# Patient Record
Sex: Female | Born: 1955 | Race: Black or African American | Hispanic: No | Marital: Married | Smoking: Never smoker
Health system: Southern US, Community
[De-identification: ages and names within clinical notes are randomized; demographics above are authoritative.]

## PROBLEM LIST (undated history)

## (undated) DIAGNOSIS — E119 Type 2 diabetes mellitus without complications: Secondary | ICD-10-CM

## (undated) DIAGNOSIS — K2970 Gastritis, unspecified, without bleeding: Secondary | ICD-10-CM

## (undated) DIAGNOSIS — N200 Calculus of kidney: Secondary | ICD-10-CM

## (undated) DIAGNOSIS — F419 Anxiety disorder, unspecified: Secondary | ICD-10-CM

## (undated) DIAGNOSIS — D126 Benign neoplasm of colon, unspecified: Secondary | ICD-10-CM

## (undated) DIAGNOSIS — N281 Cyst of kidney, acquired: Secondary | ICD-10-CM

## (undated) DIAGNOSIS — M9909 Segmental and somatic dysfunction of abdomen and other regions: Secondary | ICD-10-CM

## (undated) DIAGNOSIS — Z8679 Personal history of other diseases of the circulatory system: Secondary | ICD-10-CM

## (undated) DIAGNOSIS — I456 Pre-excitation syndrome: Secondary | ICD-10-CM

## (undated) DIAGNOSIS — K219 Gastro-esophageal reflux disease without esophagitis: Secondary | ICD-10-CM

## (undated) DIAGNOSIS — K5730 Diverticulosis of large intestine without perforation or abscess without bleeding: Secondary | ICD-10-CM

## (undated) DIAGNOSIS — E7800 Pure hypercholesterolemia, unspecified: Secondary | ICD-10-CM

## (undated) DIAGNOSIS — T7840XA Allergy, unspecified, initial encounter: Secondary | ICD-10-CM

## (undated) HISTORY — DX: Allergy, unspecified, initial encounter: T78.40XA

## (undated) HISTORY — DX: Diverticulosis of large intestine without perforation or abscess without bleeding: K57.30

## (undated) HISTORY — DX: Type 2 diabetes mellitus without complications: E11.9

## (undated) HISTORY — PX: TUBAL LIGATION: SHX77

## (undated) HISTORY — DX: Anxiety disorder, unspecified: F41.9

## (undated) HISTORY — DX: Calculus of kidney: N20.0

## (undated) HISTORY — DX: Pre-excitation syndrome: I45.6

## (undated) HISTORY — DX: Personal history of other diseases of the circulatory system: Z86.79

## (undated) HISTORY — DX: Cyst of kidney, acquired: N28.1

## (undated) HISTORY — DX: Pure hypercholesterolemia, unspecified: E78.00

## (undated) HISTORY — DX: Segmental and somatic dysfunction of abdomen and other regions: M99.09

## (undated) HISTORY — DX: Gastritis, unspecified, without bleeding: K29.70

## (undated) HISTORY — DX: Benign neoplasm of colon, unspecified: D12.6

## (undated) HISTORY — DX: Gastro-esophageal reflux disease without esophagitis: K21.9

---
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
## 1998-11-02 ENCOUNTER — Other Ambulatory Visit: Admission: RE | Admit: 1998-11-02 | Discharge: 1998-11-02 | Payer: Self-pay | Admitting: Obstetrics

## 1998-11-11 ENCOUNTER — Encounter (INDEPENDENT_AMBULATORY_CARE_PROVIDER_SITE_OTHER): Payer: Self-pay | Admitting: Specialist

## 1998-11-11 ENCOUNTER — Ambulatory Visit (HOSPITAL_COMMUNITY): Admission: RE | Admit: 1998-11-11 | Discharge: 1998-11-11 | Payer: Self-pay | Admitting: Obstetrics

## 1999-04-11 ENCOUNTER — Encounter: Admission: RE | Admit: 1999-04-11 | Discharge: 1999-07-10 | Payer: Self-pay | Admitting: Pulmonary Disease

## 2000-03-08 ENCOUNTER — Other Ambulatory Visit: Admission: RE | Admit: 2000-03-08 | Discharge: 2000-03-08 | Payer: Self-pay | Admitting: Obstetrics

## 2001-05-31 ENCOUNTER — Encounter: Payer: Self-pay | Admitting: Emergency Medicine

## 2001-05-31 ENCOUNTER — Emergency Department (HOSPITAL_COMMUNITY): Admission: EM | Admit: 2001-05-31 | Discharge: 2001-05-31 | Payer: Self-pay | Admitting: Emergency Medicine

## 2001-12-31 ENCOUNTER — Emergency Department (HOSPITAL_COMMUNITY): Admission: EM | Admit: 2001-12-31 | Discharge: 2001-12-31 | Payer: Self-pay | Admitting: Emergency Medicine

## 2001-12-31 ENCOUNTER — Encounter: Payer: Self-pay | Admitting: Emergency Medicine

## 2002-01-09 HISTORY — PX: CARDIAC ELECTROPHYSIOLOGY MAPPING AND ABLATION: SHX1292

## 2002-02-11 ENCOUNTER — Encounter: Payer: Self-pay | Admitting: Internal Medicine

## 2002-02-11 ENCOUNTER — Ambulatory Visit (HOSPITAL_COMMUNITY): Admission: RE | Admit: 2002-02-11 | Discharge: 2002-02-12 | Payer: Self-pay | Admitting: Internal Medicine

## 2002-02-11 ENCOUNTER — Encounter: Payer: Self-pay | Admitting: Cardiology

## 2004-01-06 ENCOUNTER — Ambulatory Visit: Payer: Self-pay | Admitting: Pulmonary Disease

## 2004-01-28 ENCOUNTER — Ambulatory Visit: Payer: Self-pay | Admitting: Pulmonary Disease

## 2004-02-23 ENCOUNTER — Ambulatory Visit: Payer: Self-pay | Admitting: Pulmonary Disease

## 2004-04-28 ENCOUNTER — Ambulatory Visit: Payer: Self-pay | Admitting: Pulmonary Disease

## 2004-06-07 ENCOUNTER — Ambulatory Visit: Payer: Self-pay | Admitting: Pulmonary Disease

## 2004-07-06 ENCOUNTER — Ambulatory Visit: Payer: Self-pay | Admitting: Pulmonary Disease

## 2004-07-14 ENCOUNTER — Ambulatory Visit: Payer: Self-pay | Admitting: Internal Medicine

## 2004-07-28 ENCOUNTER — Ambulatory Visit: Payer: Self-pay | Admitting: Pulmonary Disease

## 2004-08-04 ENCOUNTER — Ambulatory Visit: Payer: Self-pay

## 2004-12-05 ENCOUNTER — Ambulatory Visit: Payer: Self-pay | Admitting: Pulmonary Disease

## 2004-12-07 ENCOUNTER — Ambulatory Visit: Payer: Self-pay | Admitting: Pulmonary Disease

## 2005-01-09 HISTORY — PX: COLONOSCOPY: SHX174

## 2005-02-17 ENCOUNTER — Ambulatory Visit: Payer: Self-pay | Admitting: Pulmonary Disease

## 2005-02-22 ENCOUNTER — Ambulatory Visit: Payer: Self-pay | Admitting: Pulmonary Disease

## 2005-05-30 ENCOUNTER — Ambulatory Visit: Payer: Self-pay | Admitting: Pulmonary Disease

## 2005-08-30 ENCOUNTER — Ambulatory Visit: Payer: Self-pay | Admitting: Pulmonary Disease

## 2005-09-13 ENCOUNTER — Ambulatory Visit: Payer: Self-pay | Admitting: Pulmonary Disease

## 2005-09-19 ENCOUNTER — Ambulatory Visit: Payer: Self-pay | Admitting: Pulmonary Disease

## 2005-10-18 ENCOUNTER — Encounter: Admission: RE | Admit: 2005-10-18 | Discharge: 2006-01-16 | Payer: Self-pay | Admitting: Pulmonary Disease

## 2005-10-25 ENCOUNTER — Ambulatory Visit: Payer: Self-pay | Admitting: Gastroenterology

## 2005-11-09 DIAGNOSIS — D126 Benign neoplasm of colon, unspecified: Secondary | ICD-10-CM

## 2005-11-09 HISTORY — DX: Benign neoplasm of colon, unspecified: D12.6

## 2005-11-10 ENCOUNTER — Encounter (INDEPENDENT_AMBULATORY_CARE_PROVIDER_SITE_OTHER): Payer: Self-pay | Admitting: *Deleted

## 2005-11-10 ENCOUNTER — Ambulatory Visit: Payer: Self-pay | Admitting: Gastroenterology

## 2005-12-26 ENCOUNTER — Ambulatory Visit: Payer: Self-pay | Admitting: Pulmonary Disease

## 2006-03-07 ENCOUNTER — Encounter: Admission: RE | Admit: 2006-03-07 | Discharge: 2006-06-05 | Payer: Self-pay | Admitting: Pulmonary Disease

## 2006-04-25 ENCOUNTER — Ambulatory Visit: Payer: Self-pay | Admitting: Pulmonary Disease

## 2006-05-01 ENCOUNTER — Ambulatory Visit: Payer: Self-pay | Admitting: Pulmonary Disease

## 2006-05-01 LAB — CONVERTED CEMR LAB
ALT: 41 units/L — ABNORMAL HIGH (ref 0–40)
AST: 32 units/L (ref 0–37)
Albumin: 4 g/dL (ref 3.5–5.2)
Alkaline Phosphatase: 101 units/L (ref 39–117)
BUN: 11 mg/dL (ref 6–23)
Bacteria, UA: NEGATIVE
Basophils Absolute: 0.1 10*3/uL (ref 0.0–0.1)
Basophils Relative: 0.8 % (ref 0.0–1.0)
Bilirubin Urine: NEGATIVE
Bilirubin, Direct: 0.1 mg/dL (ref 0.0–0.3)
CO2: 34 meq/L — ABNORMAL HIGH (ref 19–32)
Calcium: 9.7 mg/dL (ref 8.4–10.5)
Chloride: 104 meq/L (ref 96–112)
Cholesterol: 184 mg/dL (ref 0–200)
Creatinine, Ser: 0.6 mg/dL (ref 0.4–1.2)
Crystals: NEGATIVE
Eosinophils Absolute: 0.3 10*3/uL (ref 0.0–0.6)
Eosinophils Relative: 2.9 % (ref 0.0–5.0)
GFR calc Af Amer: 136 mL/min
GFR calc non Af Amer: 112 mL/min
Glucose, Bld: 122 mg/dL — ABNORMAL HIGH (ref 70–99)
HCT: 40.5 % (ref 36.0–46.0)
HDL: 58.5 mg/dL (ref >39.0)
Hemoglobin, Urine: NEGATIVE
Hemoglobin: 13.6 g/dL (ref 12.0–15.0)
Hgb A1c MFr Bld: 8.2 % — ABNORMAL HIGH (ref 4.6–6.0)
Ketones, ur: NEGATIVE mg/dL
LDL Cholesterol: 111 mg/dL — ABNORMAL HIGH (ref 0–99)
Leukocytes, UA: NEGATIVE
Lymphocytes Relative: 21.2 % (ref 12.0–46.0)
MCHC: 33.5 g/dL (ref 30.0–36.0)
MCV: 89.3 fL (ref 78.0–100.0)
Monocytes Absolute: 0.5 10*3/uL (ref 0.2–0.7)
Monocytes Relative: 4.6 % (ref 3.0–11.0)
Mucus, UA: NEGATIVE
Neutro Abs: 7.5 10*3/uL (ref 1.4–7.7)
Neutrophils Relative %: 70.5 % (ref 43.0–77.0)
Nitrite: NEGATIVE
Platelets: 240 10*3/uL (ref 150–400)
Potassium: 4 meq/L (ref 3.5–5.1)
RBC / HPF: NONE SEEN
RBC: 4.53 M/uL (ref 3.87–5.11)
RDW: 12.3 % (ref 11.5–14.6)
Sodium: 143 meq/L (ref 135–145)
Specific Gravity, Urine: 1.02 (ref 1.000–1.03)
TSH: 1.36 microintl units/mL (ref 0.35–5.50)
Total Bilirubin: 0.9 mg/dL (ref 0.3–1.2)
Total CHOL/HDL Ratio: 3.1
Total Protein, Urine: NEGATIVE mg/dL
Total Protein: 7.3 g/dL (ref 6.0–8.3)
Triglycerides: 75 mg/dL (ref 0–149)
Urine Glucose: NEGATIVE mg/dL
Urobilinogen, UA: 0.2 (ref 0.0–1.0)
VLDL: 15 mg/dL (ref 0–40)
WBC: 10.7 10*3/uL — ABNORMAL HIGH (ref 4.5–10.5)
pH: 5.5 (ref 5.0–8.0)

## 2006-07-09 ENCOUNTER — Ambulatory Visit: Payer: Self-pay | Admitting: Pulmonary Disease

## 2006-09-20 ENCOUNTER — Ambulatory Visit: Payer: Self-pay | Admitting: Pulmonary Disease

## 2006-09-20 LAB — CONVERTED CEMR LAB
Cholesterol: 140 mg/dL (ref 0–200)
HDL: 37 mg/dL — ABNORMAL LOW (ref >39.0)
Hgb A1c MFr Bld: 7.4 % — ABNORMAL HIGH (ref 4.6–6.0)
LDL Cholesterol: 85 mg/dL (ref 0–99)
Total CHOL/HDL Ratio: 3.8
Triglycerides: 89 mg/dL (ref 0–149)
VLDL: 18 mg/dL (ref 0–40)

## 2006-09-27 ENCOUNTER — Ambulatory Visit: Payer: Self-pay | Admitting: Internal Medicine

## 2006-09-27 ENCOUNTER — Ambulatory Visit: Payer: Self-pay | Admitting: Pulmonary Disease

## 2006-12-15 DIAGNOSIS — F411 Generalized anxiety disorder: Secondary | ICD-10-CM | POA: Insufficient documentation

## 2006-12-15 DIAGNOSIS — K219 Gastro-esophageal reflux disease without esophagitis: Secondary | ICD-10-CM | POA: Insufficient documentation

## 2006-12-15 DIAGNOSIS — J309 Allergic rhinitis, unspecified: Secondary | ICD-10-CM | POA: Insufficient documentation

## 2006-12-15 DIAGNOSIS — E1169 Type 2 diabetes mellitus with other specified complication: Secondary | ICD-10-CM | POA: Insufficient documentation

## 2006-12-15 DIAGNOSIS — E785 Hyperlipidemia, unspecified: Secondary | ICD-10-CM

## 2006-12-28 ENCOUNTER — Ambulatory Visit: Payer: Self-pay | Admitting: Pulmonary Disease

## 2007-02-07 ENCOUNTER — Ambulatory Visit: Payer: Self-pay | Admitting: Pulmonary Disease

## 2007-03-07 ENCOUNTER — Ambulatory Visit: Payer: Self-pay | Admitting: Pulmonary Disease

## 2007-03-07 DIAGNOSIS — M999 Biomechanical lesion, unspecified: Secondary | ICD-10-CM | POA: Insufficient documentation

## 2007-03-07 DIAGNOSIS — I456 Pre-excitation syndrome: Secondary | ICD-10-CM | POA: Insufficient documentation

## 2007-03-07 DIAGNOSIS — J4520 Mild intermittent asthma, uncomplicated: Secondary | ICD-10-CM | POA: Insufficient documentation

## 2007-03-07 DIAGNOSIS — K5730 Diverticulosis of large intestine without perforation or abscess without bleeding: Secondary | ICD-10-CM | POA: Insufficient documentation

## 2007-03-07 DIAGNOSIS — N281 Cyst of kidney, acquired: Secondary | ICD-10-CM | POA: Insufficient documentation

## 2007-03-07 DIAGNOSIS — I498 Other specified cardiac arrhythmias: Secondary | ICD-10-CM | POA: Insufficient documentation

## 2007-03-07 DIAGNOSIS — J45909 Unspecified asthma, uncomplicated: Secondary | ICD-10-CM | POA: Insufficient documentation

## 2007-06-18 ENCOUNTER — Telehealth: Payer: Self-pay | Admitting: Pulmonary Disease

## 2007-06-19 ENCOUNTER — Ambulatory Visit: Payer: Self-pay | Admitting: Pulmonary Disease

## 2007-06-26 ENCOUNTER — Ambulatory Visit: Payer: Self-pay | Admitting: Pulmonary Disease

## 2007-06-26 LAB — CONVERTED CEMR LAB
ALT: 60 units/L — ABNORMAL HIGH (ref 0–35)
AST: 74 units/L — ABNORMAL HIGH (ref 0–37)
Albumin: 4.1 g/dL (ref 3.5–5.2)
Alkaline Phosphatase: 106 units/L (ref 39–117)
BUN: 10 mg/dL (ref 6–23)
Basophils Absolute: 0.1 10*3/uL (ref 0.0–0.1)
Basophils Relative: 1 % (ref 0.0–1.0)
Bilirubin Urine: NEGATIVE
Bilirubin, Direct: 0.8 mg/dL — ABNORMAL HIGH (ref 0.0–0.3)
CO2: 29 meq/L (ref 19–32)
Calcium: 9 mg/dL (ref 8.4–10.5)
Chloride: 103 meq/L (ref 96–112)
Cholesterol: 138 mg/dL (ref 0–200)
Creatinine, Ser: 0.7 mg/dL (ref 0.4–1.2)
Eosinophils Absolute: 0.3 10*3/uL (ref 0.0–0.7)
Eosinophils Relative: 4.1 % (ref 0.0–5.0)
GFR calc Af Amer: 113 mL/min
GFR calc non Af Amer: 93 mL/min
Glucose, Bld: 166 mg/dL — ABNORMAL HIGH (ref 70–99)
HCT: 40.2 % (ref 36.0–46.0)
HDL: 35.4 mg/dL — ABNORMAL LOW (ref >39.0)
Hemoglobin, Urine: NEGATIVE
Hemoglobin: 13.8 g/dL (ref 12.0–15.0)
Hgb A1c MFr Bld: 8.2 % — ABNORMAL HIGH (ref 4.6–6.0)
Ketones, ur: NEGATIVE mg/dL
LDL Cholesterol: 92 mg/dL (ref 0–99)
Leukocytes, UA: NEGATIVE
Lymphocytes Relative: 23 % (ref 12.0–46.0)
MCHC: 34.3 g/dL (ref 30.0–36.0)
MCV: 89 fL (ref 78.0–100.0)
Monocytes Absolute: 0.5 10*3/uL (ref 0.1–1.0)
Monocytes Relative: 6.1 % (ref 3.0–12.0)
Neutro Abs: 5 10*3/uL (ref 1.4–7.7)
Neutrophils Relative %: 65.8 % (ref 43.0–77.0)
Nitrite: NEGATIVE
Platelets: 195 10*3/uL (ref 150–400)
Potassium: 4.8 meq/L (ref 3.5–5.1)
RBC: 4.51 M/uL (ref 3.87–5.11)
RDW: 12.2 % (ref 11.5–14.6)
Sodium: 141 meq/L (ref 135–145)
Specific Gravity, Urine: >=1.03 (ref 1.000–1.03)
TSH: 0.95 microintl units/mL (ref 0.35–5.50)
Total Bilirubin: 1.4 mg/dL — ABNORMAL HIGH (ref 0.3–1.2)
Total CHOL/HDL Ratio: 3.9
Total Protein: 7.1 g/dL (ref 6.0–8.3)
Triglycerides: 55 mg/dL (ref 0–149)
Urine Glucose: NEGATIVE mg/dL
Urobilinogen, UA: 0.2 (ref 0.0–1.0)
VLDL: 11 mg/dL (ref 0–40)
WBC: 7.6 10*3/uL (ref 4.5–10.5)
pH: 5.5 (ref 5.0–8.0)

## 2007-08-14 ENCOUNTER — Telehealth: Payer: Self-pay | Admitting: Gastroenterology

## 2007-08-16 ENCOUNTER — Telehealth (INDEPENDENT_AMBULATORY_CARE_PROVIDER_SITE_OTHER): Payer: Self-pay | Admitting: *Deleted

## 2007-09-10 DIAGNOSIS — Z860100 Personal history of colon polyps, unspecified: Secondary | ICD-10-CM | POA: Insufficient documentation

## 2007-09-11 ENCOUNTER — Ambulatory Visit: Payer: Self-pay | Admitting: Gastroenterology

## 2007-09-13 ENCOUNTER — Ambulatory Visit (HOSPITAL_COMMUNITY): Admission: RE | Admit: 2007-09-13 | Discharge: 2007-09-13 | Payer: Self-pay | Admitting: Gastroenterology

## 2007-09-17 ENCOUNTER — Telehealth (INDEPENDENT_AMBULATORY_CARE_PROVIDER_SITE_OTHER): Payer: Self-pay

## 2007-09-18 ENCOUNTER — Ambulatory Visit: Payer: Self-pay | Admitting: Gastroenterology

## 2007-09-18 ENCOUNTER — Encounter: Payer: Self-pay | Admitting: Gastroenterology

## 2007-09-22 ENCOUNTER — Encounter: Payer: Self-pay | Admitting: Gastroenterology

## 2007-10-23 ENCOUNTER — Ambulatory Visit: Payer: Self-pay | Admitting: Gastroenterology

## 2007-10-23 DIAGNOSIS — K2970 Gastritis, unspecified, without bleeding: Secondary | ICD-10-CM | POA: Insufficient documentation

## 2007-10-23 DIAGNOSIS — K2990 Gastroduodenitis, unspecified, without bleeding: Secondary | ICD-10-CM

## 2007-10-23 DIAGNOSIS — N200 Calculus of kidney: Secondary | ICD-10-CM | POA: Insufficient documentation

## 2007-12-10 ENCOUNTER — Telehealth (INDEPENDENT_AMBULATORY_CARE_PROVIDER_SITE_OTHER): Payer: Self-pay | Admitting: *Deleted

## 2007-12-11 ENCOUNTER — Telehealth: Payer: Self-pay | Admitting: Pulmonary Disease

## 2007-12-11 ENCOUNTER — Encounter: Payer: Self-pay | Admitting: Pulmonary Disease

## 2008-05-08 ENCOUNTER — Telehealth: Payer: Self-pay | Admitting: Pulmonary Disease

## 2008-05-15 ENCOUNTER — Ambulatory Visit: Payer: Self-pay | Admitting: Adult Health

## 2008-05-16 ENCOUNTER — Encounter: Payer: Self-pay | Admitting: Adult Health

## 2008-05-18 LAB — CONVERTED CEMR LAB
Bilirubin Urine: NEGATIVE
Hemoglobin, Urine: NEGATIVE
Ketones, ur: NEGATIVE mg/dL
Leukocytes, UA: NEGATIVE
Nitrite: NEGATIVE
Specific Gravity, Urine: 1.025 (ref 1.000–1.030)
Total Protein, Urine: NEGATIVE mg/dL
Urine Glucose: >=1000 mg/dL
Urobilinogen, UA: 0.2 (ref 0.0–1.0)
pH: 5.5 (ref 5.0–8.0)

## 2008-06-09 ENCOUNTER — Telehealth: Payer: Self-pay | Admitting: Pulmonary Disease

## 2008-06-10 ENCOUNTER — Ambulatory Visit: Payer: Self-pay | Admitting: Pulmonary Disease

## 2008-06-17 ENCOUNTER — Ambulatory Visit: Payer: Self-pay | Admitting: Pulmonary Disease

## 2008-06-17 LAB — CONVERTED CEMR LAB
Albumin: 3.9 g/dL (ref 3.5–5.2)
Alkaline Phosphatase: 101 units/L (ref 39–117)
BUN: 12 mg/dL (ref 6–23)
Basophils Absolute: 0 10*3/uL (ref 0.0–0.1)
CO2: 28 meq/L (ref 19–32)
Calcium: 8.7 mg/dL (ref 8.4–10.5)
Creatinine, Ser: 0.7 mg/dL (ref 0.4–1.2)
Eosinophils Relative: 5.5 % — ABNORMAL HIGH (ref 0.0–5.0)
GFR calc non Af Amer: 112.23 mL/min (ref >60)
Glucose, Bld: 161 mg/dL — ABNORMAL HIGH (ref 70–99)
HCT: 38.5 % (ref 36.0–46.0)
HDL: 40.5 mg/dL (ref >39.00)
Hemoglobin: 13.3 g/dL (ref 12.0–15.0)
Hgb A1c MFr Bld: 8.1 % — ABNORMAL HIGH (ref 4.6–6.5)
Ketones, ur: NEGATIVE mg/dL
Leukocytes, UA: NEGATIVE
Lymphocytes Relative: 20.5 % (ref 12.0–46.0)
Lymphs Abs: 1.6 10*3/uL (ref 0.7–4.0)
Microalb Creat Ratio: 5 mg/g (ref 0.0–30.0)
Microalb, Ur: 0.5 mg/dL (ref 0.0–1.9)
Monocytes Relative: 5.6 % (ref 3.0–12.0)
Neutro Abs: 5.4 10*3/uL (ref 1.4–7.7)
RBC: 4.36 M/uL (ref 3.87–5.11)
Sodium: 145 meq/L (ref 135–145)
Specific Gravity, Urine: 1.02 (ref 1.000–1.030)
Total Protein: 7 g/dL (ref 6.0–8.3)
Triglycerides: 121 mg/dL (ref 0.0–149.0)
Urine Glucose: NEGATIVE mg/dL
Urobilinogen, UA: 1 (ref 0.0–1.0)
VLDL: 24.2 mg/dL (ref 0.0–40.0)
Vit D, 25-Hydroxy: 15 ng/mL — ABNORMAL LOW (ref 30–89)
WBC: 7.8 10*3/uL (ref 4.5–10.5)
pH: 6 (ref 5.0–8.0)

## 2008-07-01 ENCOUNTER — Telehealth (INDEPENDENT_AMBULATORY_CARE_PROVIDER_SITE_OTHER): Payer: Self-pay | Admitting: *Deleted

## 2008-07-01 ENCOUNTER — Telehealth: Payer: Self-pay | Admitting: Gastroenterology

## 2008-10-14 ENCOUNTER — Ambulatory Visit: Payer: Self-pay | Admitting: Pulmonary Disease

## 2009-01-07 LAB — PROCEDURE REPORT - SCANNED: Pap: ABNORMAL — AB

## 2009-05-18 ENCOUNTER — Telehealth: Payer: Self-pay | Admitting: Pulmonary Disease

## 2009-05-19 ENCOUNTER — Ambulatory Visit: Payer: Self-pay | Admitting: Pulmonary Disease

## 2009-05-24 ENCOUNTER — Ambulatory Visit: Payer: Self-pay | Admitting: Pulmonary Disease

## 2009-05-24 DIAGNOSIS — E559 Vitamin D deficiency, unspecified: Secondary | ICD-10-CM | POA: Insufficient documentation

## 2009-05-24 LAB — CONVERTED CEMR LAB
CO2: 29 meq/L (ref 19–32)
Chloride: 102 meq/L (ref 96–112)
Cholesterol: 220 mg/dL — ABNORMAL HIGH (ref 0–200)
Direct LDL: 164.7 mg/dL
Hgb A1c MFr Bld: 7.7 % — ABNORMAL HIGH (ref 4.6–6.5)
Potassium: 3.9 meq/L (ref 3.5–5.1)
Total CHOL/HDL Ratio: 5
VLDL: 28.6 mg/dL (ref 0.0–40.0)

## 2009-06-15 ENCOUNTER — Ambulatory Visit: Payer: Self-pay | Admitting: Family Medicine

## 2009-06-15 ENCOUNTER — Encounter: Payer: Self-pay | Admitting: Pulmonary Disease

## 2009-11-03 ENCOUNTER — Telehealth: Payer: Self-pay | Admitting: Pulmonary Disease

## 2009-11-05 ENCOUNTER — Telehealth: Payer: Self-pay | Admitting: Pulmonary Disease

## 2009-11-08 ENCOUNTER — Encounter: Payer: Self-pay | Admitting: Pulmonary Disease

## 2009-11-16 ENCOUNTER — Encounter: Payer: Self-pay | Admitting: Pulmonary Disease

## 2009-11-16 ENCOUNTER — Ambulatory Visit: Payer: Self-pay | Admitting: Adult Health

## 2009-11-16 DIAGNOSIS — N3000 Acute cystitis without hematuria: Secondary | ICD-10-CM | POA: Insufficient documentation

## 2009-11-17 ENCOUNTER — Encounter: Payer: Self-pay | Admitting: Adult Health

## 2009-11-25 ENCOUNTER — Encounter: Payer: Self-pay | Admitting: Adult Health

## 2009-12-29 ENCOUNTER — Telehealth (INDEPENDENT_AMBULATORY_CARE_PROVIDER_SITE_OTHER): Payer: Self-pay | Admitting: *Deleted

## 2010-02-08 NOTE — Medication Information (Signed)
Summary: Prior autho & approved for Dexilant/[employer]  Prior autho & approved for Dexilant/[employer]   Imported By: Sherian Rein 11/12/2009 11:08:32  _____________________________________________________________________  External Attachment:    Type:   Image     Comment:   External Document

## 2010-02-08 NOTE — Miscellaneous (Signed)
Summary: BONE DENSITY  Clinical Lists Changes  Orders: Added new Test order of T-Bone Densitometry (77080) - Signed Added new Test order of T-Lumbar Vertebral Assessment (77082) - Signed 

## 2010-02-08 NOTE — Progress Notes (Signed)
Summary: rx sub?  Phone Note Call from Patient   Caller: Patient Call For: Lynda Capistran Summary of Call: pt says that her ins will cover nexium if this is something dr Kriste Basque would want her to have in the meantime while she waits for prior auth on dexilant. pt needs something now. call her cell [phone] ([facility] [address]) Initial call taken by: Tivis Ringer, CNA,  November 05, 2009 3:54 PM  Follow-up for Phone Call        called and spoke with pt and i explained to her that i would leave 3 samples of the nexium up front so she does not have to pay for 2 different rx---pt voiced her understanding and is aware that we will let her know about the prior auth for the dexilant once we hear from them Randell Loop CMA  November 05, 2009 4:06 PM

## 2010-02-08 NOTE — Assessment & Plan Note (Signed)
Summary: UTI- PT GOING TO [facility] ///[facility]   Visit Type:  NP sick visit Primary Provider/Referring Provider:  Lorin Picket Nadel,M.D  CC:  C/o urinary urgency, frequency, odor, and pressure x  week c/o trace of blood starting yesterday.  History of Present Illness: 55  y/o BF with known hx of asthma  and DM.     ~  Jun10:  yearly follow up and still not taking her DM & Chol meds regularly- she admits to missing the evening doses 2-3-4 times weekly!!!  she uses [facility] on [address] and they have been calling her to let her know she is not regular w/ her meds... We discussed an adjusted med regimen- see A/P...  ~  Oct10:  she has no complaints- but informs me that she stopped the Januvia due to $$$... notes BS at home are 90-120 range & feeling well... she does note some allergy symptoms- HA, facial pain, stuffy nose, etc... ** we decided to Rx w/ ZPak + other meds.   ~  May 24, 2009:  here w/ husb for routine f/u> she admits to stopping her Lipitor 5 months ago due to a bad reaction her husb had w/ this med even though she tolerated it fine... FLP reveals TChol 220, LDL 165 & we discussed restarting this medication regularly ("I have alot of it at home")... she does say that she is taking the 3 DM meds regularly ([facility] [address] does not corroborate regular refills> ?getting mail order Rx) & labs show BS 95, A1c 7.7.Marland KitchenMarland Kitchen  we reviewed diet/ exercise/ & regular dosing recommendations... she notes some incr allergy symptoms and dizziness...  November 16, 2009 --Presents for an acute office visit. Complains of urinary urgency, frequency, odor, pressure x  1 week. Did notice a  trace of blood starting yesterday. No back pain, fever or vomitting. Last UTI was >3 yrs ago. > . Denies chest pain, dyspnea, orthopnea, hemoptysis, fever, n/v/d, edema, abx use. Doing well with DM meds. No hypoglycemic episodes.   Preventive Screening-Counseling & Management  Alcohol-Tobacco     Smoking Status: never  Current  Medications (verified): 1)  Zyrtec Allergy 10 Mg  Tabs (Cetirizine Hcl) .... As Needed 2)  Nasonex 50 Mcg/act  Susp (Mometasone Furoate) .... 2 Puffs in Each Nostril At Bedtime As Needed 3)  Advair Diskus 100-50 Mcg/dose  Misc (Fluticasone-Salmeterol) .... Take 1 Puff Two Times A Day Rinse Mouth After Each Use As Needed 4)  Proair Hfa 108 (90 Base) Mcg/act  Aers (Albuterol Sulfate) .Marland Kitchen.. 1-2 Inhalations Q4-6h As Needed For Wheezing... 5)  Lipitor 20 Mg  Tabs (Atorvastatin Calcium) .... Take One Tablet By Mouth Once Daily.Marland KitchenMarland Kitchen 6)  Metformin Hcl 500 Mg Tabs (Metformin Hcl) .... Take 2 Tabs By Mouth Two Times A Day W/ Breakfast & Dinner 7)  Glimepiride 4 Mg Tabs (Glimepiride) .... Take 1 Tab By Mouth Once Daily At Breakfast... 8)  Januvia 100 Mg  Tabs (Sitagliptin Phosphate) .... Take 1 Tablet By Mouth Once A Day At [employer]... 9)  Dexilant 60 Mg Cpdr (Dexlansoprazole) .... Take One By Mouth Once Daily... 10)  Womens Multivitamin Plus  Tabs (Multiple Vitamins-Minerals) .... Take 1 Tab Daily... 11)  Vitamin D 1000 Unit Caps (Cholecalciferol) .... Take 2 Caps By Mouth Once Daily... 12)  Accu-Chek Aviva  Strp (Glucose Blood) .... Check Blood Sugar Daily  Allergies (verified): 1)  ! 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) HYPERCHOLESTEROLEMIA (ICD-272.0) DIABETES MELLITUS, TYPE II (ICD-250.00) GERD (ICD-530.81) GASTRITIS (ICD-535.50) DIVERTICULOSIS OF COLON (ICD-562.10) COLONIC POLYPS, ADENOMATOUS, HX OF (ICD-V12.72) NEPHROLITHIASIS (ICD-592.0) RENAL CYST (ICD-593.2) VITAMIN D DEFICIENCY (ICD-268.9) ANXIETY (ICD-300.00) SOMATIC DYSFUNCTION (ICD-739.9)  Past Surgical History: Last updated: Sep 22, 2007 Unremarkable  Family History: Last updated: 22-Sep-2007 mother died age and cause unsure father alive  age 16 Family History of Breast Cancer: mother Family History of Diabetes: Father No FH of Colon Cancer:  Social History: Last updated: September 22, 2007 works for [employer] never smoked no etoh married 4 children Patient gets regular exercise.  Review of Systems      See HPI  Vital Signs:  Patient profile:   55 year old female Height:      62 inches Weight:      161 pounds BMI:     29.55 O2 Sat:      98 % on Room air Temp:     98.1 degrees F oral Pulse rate:   102 / minute BP sitting:   118 / 80  (left arm) Cuff size:   large  Vitals Entered By: Zackery Barefoot CMA (November 16, 2009 10:03 AM)  O2 Flow:  Room air CC: C/o urinary urgency, frequency, odor, pressure x  week c/o trace of blood starting yesterday Comments Medications reviewed with patient Verified contact number and pharmacy with patient Zackery Barefoot CMA  November 16, 2009 10:04 AM    Physical Exam  Additional Exam:  WD, WN, 55 y/o BF in NAD...   GENERAL:  Alert & oriented; pleasant & cooperative... HEENT:  [place]/AT, EOM-wnl,  EACs-clear, TMs-wnl, NOSE-clear, THROAT-clear & wnl. NECK:  Supple w/ full ROM; no JVD; normal carotid impulses w/o bruits; no thyromegaly or nodules palpated; no lymphadenopathy. CHEST:  Clear to P & A; without wheezes/ rales/ or rhonchi heard... HEART:  Regular Rhythm; without murmurs/ rubs/ or gallops. ABDOMEN:  Soft & nontender; normal bowel sounds; no organomegaly or masses detected, neg CVA tenderness.  EXT: without deformities or arthritic changes; no varicose veins/ venous insuffic/ or edema. NEURO:  CN's intact; no focal neuro deficits... DERM:  No lesions noted; no rash etc...     Impression & Recommendations:  Problem # 1:  ACUTE CYSTITIS (ICD-595.0)  UDIP w/ tr bld, and small leuk C/W UTI Cx pending.  Plan  Begin Cirpo 250mg  two times a day for urinary tract infection.  Pyridium as needed  I will call with labs.  Please contact office for sooner follow  up if symptoms do not improve or worsen  Her updated medication list for this problem includes:    Cipro 250 Mg Tabs (Ciprofloxacin hcl) .Marland Kitchen... 1 by mouth two times a day    Pyridium 100 Mg Tabs (Phenazopyridine hcl) .Marland Kitchen... 1 by mouth three times a day as needed bladder pain  Orders: T-Urine Culture (Spectrum Order) [id number]) Est. Patient Level IV (96295)  Problem # 2:  DIABETES MELLITUS, TYPE II (ICD-250.00) diet and exercise discussed  labs pending.  Her updated medication list for this problem includes:    Metformin Hcl 500 Mg Tabs (Metformin hcl) .Marland Kitchen... Take 2 tabs by mouth two times a day w/ breakfast & dinner    Glimepiride 4 Mg Tabs (Glimepiride) .Marland Kitchen... Take 1 tab by mouth once daily at breakfast...    Januvia 100 Mg Tabs (Sitagliptin phosphate) .Marland Kitchen... Take 1 tablet by mouth once a day at dinner...  Orders: TLB-BMP (Basic Metabolic Panel-BMET) (80048-METABOL) TLB-A1C / Hgb A1C (Glycohemoglobin) (83036-A1C)  Labs Reviewed: Creat: 0.6 (05/19/2009)    Reviewed HgBA1c results: 7.7 (05/19/2009)  8.1 (06/10/2008)  Medications Added to Medication List This Visit: 1)  Nasonex 50 Mcg/act Susp (Mometasone furoate) .... 2 puffs in each nostril at bedtime as needed 2)  Advair Diskus 100-50 Mcg/dose Misc (Fluticasone-salmeterol) .... Take 1 puff two times a day rinse mouth after each use as needed 3)  Cipro 250 Mg Tabs (Ciprofloxacin hcl) .Marland Kitchen.. 1 by mouth two times a day 4)  Pyridium 100 Mg Tabs (Phenazopyridine hcl) .Marland Kitchen.. 1 by mouth three times a day as needed bladder pain  Complete Medication List: 1)  Lipitor 20 Mg Tabs (Atorvastatin calcium) .... Take one tablet by mouth once daily.Marland KitchenMarland Kitchen 2)  Metformin Hcl 500 Mg Tabs (Metformin hcl) .... Take 2 tabs by mouth two times a day w/ breakfast & dinner 3)  Glimepiride 4 Mg Tabs (Glimepiride) .... Take 1 tab by mouth once daily at breakfast... 4)  Januvia 100 Mg Tabs (Sitagliptin phosphate) .... Take 1 tablet by mouth once a day at dinner... 5)   Dexilant 60 Mg Cpdr (Dexlansoprazole) .... Take one by mouth once daily.Marland KitchenMarland Kitchen 6)  Womens Multivitamin Plus Tabs (Multiple vitamins-minerals) .... Take 1 tab daily.Marland KitchenMarland Kitchen 7)  Vitamin D 1000 Unit Caps (Cholecalciferol) .... Take 2 caps by mouth once daily.Marland KitchenMarland Kitchen 8)  Accu-chek Aviva Strp (Glucose blood) .... Check blood sugar daily 9)  Zyrtec Allergy 10 Mg Tabs (Cetirizine hcl) .... As needed 10)  Nasonex 50 Mcg/act Susp (Mometasone furoate) .... 2 puffs in each nostril at bedtime as needed 11)  Advair Diskus 100-50 Mcg/dose Misc (Fluticasone-salmeterol) .... Take 1 puff two times a day rinse mouth after each use as needed 12)  Proair Hfa 108 (90 Base) Mcg/act Aers (Albuterol sulfate) .Marland Kitchen.. 1-2 inhalations q4-6h as needed for wheezing... 13)  Cipro 250 Mg Tabs (Ciprofloxacin hcl) .Marland Kitchen.. 1 by mouth two times a day 14)  Pyridium 100 Mg Tabs (Phenazopyridine hcl) .Marland Kitchen.. 1 by mouth three times a day as needed bladder pain  Patient Instructions: 1)  follow up Dr. Kriste Basque in 4 weeks for routine follow up and labs. -come fasting. February 13th, 10:30 am 2)  Begin Cirpo 250mg  two times a day for urinary tract infection.  3)  Diabetic diet w/ low sweets.  4)  I will call with labs.  5)  Please contact office for sooner follow up if symptoms do not improve or worsen  Prescriptions: PYRIDIUM 100 MG TABS (PHENAZOPYRIDINE HCL) 1 by mouth three times a day as needed bladder pain  #6 x 0   Entered and Authorized by:   Rubye Oaks NP   Signed by:   Tammy Parrett NP on 11/16/2009   Method used:   Electronically to        [id number] (retail)       [address] [place], [place]  [address]       Ph: [phone]       Fax: [phone]   RxID:   [id number] CIPRO 250 MG TABS (CIPROFLOXACIN HCL) 1 by mouth two times a day  #14 x 0   Entered and Authorized by:   Rubye Oaks NP   Signed by:   Tammy Parrett NP on 11/16/2009   Method used:   Electronically to        [id number]* (retail)       [address]       [place],  [place]  [address]       Ph: [phone]       Fax: [phone]   RxID:   [id number]    Not Administered:    Influenza Vaccine not given due to: declined

## 2010-02-08 NOTE — Progress Notes (Signed)
Summary: prior auth for dexilant 60mg  - approved 10.10.11 thru 10.30.13  Phone Note Outgoing Call   Summary of Call: called to initiate prior auth for pt to get the dexilant 60mg  daily---waiting on papers to be faxed over--case # [id number] Randell Loop CMA  November 03, 2009 5:07 PM   Follow-up for Phone Call        PA form received and placed  on SN cart for review and signature.Michel Bickers [facility]  November 04, 2009 11:14 AM  received PA approval for dexilant from 10.10.11 through 10.30.13.  approval letter and PA form sent to be scanned into EMR.  LMOM TCB to inform pt of this.  called [facility], spoke with pharm tech and advised of dexilant approval.   Boone Master CNA/MA  November 09, 2009 11:05 AM  Follow-up by: Michele Mcalpine MD,  November 12, 2009 6:37 PM

## 2010-02-08 NOTE — Progress Notes (Signed)
Summary: labs  Phone Note Call from Patient Call back at Home Phone [phone]   Caller: Patient Call For: Lenore Moyano Summary of Call: pt is due to have her 6 months check up. says she usually has labs done for diabetes, cholesterol, etc at this time. should she see tp for this or just have labs set up and then schedule a cpx w/ Tobey Schmelzle for next avail in aug?  Initial call taken by: Tivis Ringer, CNA,  May 18, 2009 9:39 AM  Follow-up for Phone Call        Pt was last seen by SN in Oct 2010 and was told to f/u in 6 months (no appts made in [facility]) .  Pt states she usually gets labs done as well.  Sn's first avail isn't until August.  Please advise if she needs to see TP, SN, or just have bloodwork done.  Thanks.  Aundra Millet Reynolds LPN  May 18, 2009 9:55 AM   Additional Follow-up for Phone Call Additional follow up Details #1::        per SN---add her on 5-16 at 3pm and she can come in for labs prior to ov  for    lip-272.0/bmp-401.9 and a1c-250.00.  thanks  labs are in computer for pt and i lmomtcb to make the appt for her. Randell Loop CMA  May 18, 2009 3:11 PM     Additional Follow-up for Phone Call Additional follow up Details #2::    pt called back and she is  able to come to the appt on 5-16 at 3pm.  this appt has been added to the schedule and she is aware of labs in the computer for in the am. Randell Loop CMA  May 18, 2009 4:00 PM

## 2010-02-08 NOTE — Miscellaneous (Signed)
Summary: 11.8.11 lab results > pt aware   Clinical Lists Changes     received lab results from [facility] from 11.8.11 ov w/ TP in [place].  per TP: kidney function looks good.  DM not quite as good.  rec better diet, recheck in 3 months.  if no better then, will need lantus. Boone Master CNA/MA  November 25, 2009 5:26 PM   called spoke with patient, advised of lab results as stated by TP in above append.  pt verbalized her understanding.  pt states that she has not done well w/ taking her metformin w/ dinner and will stive to improve at this in addition to better diet.  pt has upcoming appt w/ SN 2.2012, will keep that appt.  labs sent down to be scanned into EMR. Boone Master CNA/MA  November 26, 2009 12:24 PM

## 2010-02-08 NOTE — Letter (Signed)
Summary: Alroy Dust MD  Alroy Dust MD   Imported By: Sherian Rein 11/30/2009 08:14:14  _____________________________________________________________________  External Attachment:    Type:   Image     Comment:   External Document

## 2010-02-08 NOTE — Assessment & Plan Note (Signed)
Summary: 6 month follow up/labs prior to ov/la   Primary Care Provider:  Lorin Picket Letonia Stead,M.D  CC:  7 month ROV & review of mult medical problems....  History of Present Illness: 55 y/o BF here for a follow up visit... she has multiple medical problems as noted below...    ~  6/09:  she has a history of asthma and recurrent bronchitic infections, plus mult med issues as noted below... feeling OK in general, happy that her weight is down 5# on diet... unfortunately, she is still not taking her DM meds properly- still on just one Glucovance pill per day and her labs are worse than 9/08- see below... we discussed taking the Glucovance Bid w/ breakfast and dinner and I showed her where this is printed out for her on her disch sheets after each OV...   ~  Jun10:  yearly follow up and still not taking her DM & Chol meds regularly- she admits to missing the evening doses 2-3-4 times weekly!!!  she uses [facility] on [address] and they have been calling her to let her know she is not regular w/ her meds... We discussed an adjusted med regimen- see A/P...  ~  Oct10:  she has no complaints- but informs me that she stopped the Januvia due to $$$... notes BS at home are 90-120 range & feeling well... she does note some allergy symptoms- HA, facial pain, stuffy nose, etc... ** we decided to Rx w/ ZPak + other meds.   ~  May 24, 2009:  here w/ husb for routine f/u> she admits to stopping her Lipitor 5 months ago due to a bad reaction her husb had w/ this med even though she tolerated it fine... FLP reveals TChol 220, LDL 165 & we discussed restarting this medication regularly ("I have alot of it at home")... she does say that she is taking the 3 DM meds regularly ([facility] [address] does not corroborate regular refills> ?getting mail order Rx) & labs show BS 95, A1c 7.7.Marland KitchenMarland Kitchen  we reviewed diet/ exercise/ & regular dosing recommendations... she notes some incr allergy symptoms and dizziness...    Current Problem  List:  ALLERGIC RHINITIS (ICD-477.9) - controlled on Zyrtek, Nasonex, & Saline...  Note: ENT eval in past was neg.  ASTHMA (ICD-493.90) - controlled on ADVAIR 100 Bid (but only using it Prn now), & PROAIR Prn... uses Mucinex Prn as well.  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 has intermittent mild palpit, none recently...   ~  Myoview 7/06 was normal w/o ischemia, norm wall motion, EF=93%...   ~  2DEcho 7/06 w/ norm LVF...  ~  EKG 6/10 shows NSR, rate 90, NAD...  HYPERCHOLESTEROLEMIA (ICD-272.0) - prev on LIPITOR 20mg /d- she says she stopped it  ~1/11 due to reaction that her husb was having, even though she tolerated it well...  ~  FLP 9/08 on Lip20 showed TChol 140, TG 89, HDL 37, LDL 85  ~  FLP 6/09 on Lip20 showed TChol 138, TG 55, HDL 35, LDL 92... rec incr exercise, same med.  ~  FLP 6/10 ?on Lip20 showed TChol 181, TG 121, HDL 41, LDL 116... rec> take meds regularly!  ~  FLP 5/11 off med showed TChol 220, TG 143, HDL 47, LDL 165... rec> restart Lip20 every day!  DIABETES MELLITUS, TYPE II (ICD-250.00) - on METFORMIN 500mg - 2Bid, GLIMEPIRIDE 4mg /d, JANUVIA 100mg /d...  ~  labs 9/08 on Glucov showed BS= 122, HgA1c= 7.4... she was told to incr Glucovance to Bid after last OV but didn't do it!  ~  labs 6/09 showed BS= 166, HgA1c= 8.2.Marland KitchenMarland Kitchen rec to incr the Glucovance to Bid!!!  ~  12/09: had eye check from DrDolan- no retinopathy...  ~  labs 6/10 showed BS= 161, A1c= 8.1, Microalb= neg... decided to change Rx to Metformin 1000Bid, Glimepiride 4mg /d, Januvia 100mg /d.  ~  10/10: pt reports that she's not taking the Januvia due to $$, but can fill it now since deduct is met!  ~  labs 5/11 ?on 3 meds? showed BS= 95, A1c= 7.7.Marland KitchenMarland Kitchen rec to get on diet, take meds regularly.  GERD (ICD-530.81) - on DEXILANT 60mg /d... Omep was without benefit...  ~  GI eval 9/09 by DrStark- EGD showed  gastrityis, GERD;  AbdSonar showed fatty liver, cyst in left hep lobe, bilat renal cysts & stones.   DIVERTICULOSIS OF COLON (ICD-562.10) COLONIC POLYPS (ICD-211.3) - last colonoscopy was 11/07 showing divertics and 4mm polyp (adenomatous)... f/u planned 32yrs.  RENAL CYST (ICD-593.2) - followed by DGUYQIHK... AbdSonar 9/09 showed bilat renal cysts and stones in kidneys.  VITAMIN D DEFICIENCY (ICD-268.9) - Vit D level 6/10 = 15 and she was started on OTC Vit D 2000u daily.  ANXIETY (ICD-300.00) - she uses Alpraz Prn only...  SOMATIC DYSFUNCTION (ICD-739.9) - hx mult somatic complaints...  Health Maintenance -   ~  GYN: DrMarshall's office- PAP 11/09 was normal according to the pt...   ~  Mammograms at [facility] were OK according to the pt...  ~  BMD here 9/08 showed TScores -0.3 in Spine, and -0.4 in left FemNeck... NOTE: Vit D level= 15, therefore rec OTC Vit D 1000u Bid.  ~  Immunizations:  she had Flu shot 2010 at work... ?if they gave her a Pneumonia shot... ?last Tetanus vaccine.   Allergies: 1)  ! 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 mother died age and cause unsure father alive age 74 Family History of Breast Cancer: mother Family History of Diabetes: Father No FH of Colon Cancer:  Social History: Reviewed history from 09/11/2007 and no changes required. works for [employer] never smoked no  etoh married 4 children Patient gets regular exercise.  Review of Systems      See HPI  The patient denies anorexia, fever, weight loss, weight gain, vision loss, decreased hearing, hoarseness, chest pain, syncope, dyspnea on exertion, peripheral edema, prolonged cough, headaches, hemoptysis, abdominal pain, melena, hematochezia, severe indigestion/heartburn, hematuria, incontinence, muscle weakness, suspicious skin lesions, transient blindness, difficulty walking, depression, unusual weight change, abnormal bleeding, enlarged lymph nodes, and angioedema.    Vital Signs:  Patient profile:   55 year old female Height:      62 inches Weight:      159 pounds BMI:     29.19 O2 Sat:      95 % on Room air Temp:     99.1 degrees F oral Pulse rate:   96 / minute BP sitting:   124 / 80  (left arm) Cuff size:   regular  Vitals Entered By:  Randell Loop CMA (May 24, 2009 2:48 PM)  O2 Sat at Rest %:  95 O2 Flow:  Room air CC: 7 month ROV & review of mult medical problems... Is Patient Diabetic? Yes Pain Assessment Patient in pain? yes      Onset of pain  facial pain and bil ear pain Comments  meds updated today   Physical Exam  Additional Exam:  WD, WN, 55 y/o BF in NAD...   GENERAL:  Alert & oriented; pleasant & cooperative... HEENT:  [place]/AT, EOM-wnl,  EACs-clear, TMs-wnl, NOSE-clear, THROAT-clear & wnl. NECK:  Supple w/ full ROM; no JVD; normal carotid impulses w/o bruits; no thyromegaly or nodules palpated; no lymphadenopathy. CHEST:  Clear to P & A; without wheezes/ rales/ or rhonchi heard... HEART:  Regular Rhythm; without murmurs/ rubs/ or gallops. ABDOMEN:  Soft & nontender; normal bowel sounds; no organomegaly or masses detected, neg CVA tenderness.  EXT: without deformities, mild arthritic changes; no varicose veins/ venous insuffic/ or edema. NEURO:  CN's intact; no focal neuro deficits... DERM:  No lesions noted; no rash etc...    MISC. Report  Procedure date:   05/19/2009  Findings:      Lipid Panel (LIPID)   Cholesterol          [H]  220 mg/dL                   5-409   Triglycerides             143.0 mg/dL                 8.1-191.4   HDL                       78.29 mg/dL                 >56.21  Cholesterol LDL - Direct                             164.7 mg/dL  BMP (METABOL)   Sodium                    141 mEq/L                   135-145   Potassium                 3.9 mEq/L                   3.5-5.1   Chloride                  102 mEq/L                   96-112   Carbon Dioxide            29 mEq/L                    19-32   Glucose                   95 mg/dL                    30-86   BUN                       10 mg/dL  6-23   Creatinine                0.6 mg/dL                   1.6-1.0   Calcium                   9.2 mg/dL                   9.6-04.5   GFR                       138.94 mL/min               >60  Hemoglobin A1C (A1C)   Hemoglobin A1C       [H]  7.7 %                       4.6-6.5   Impression & Recommendations:  Problem # 1:  ASTHMA (ICD-493.90) Stable on Prn Advair & Proventil... chest clear, no recent exac... Her updated medication list for this problem includes:    Advair Diskus 100-50 Mcg/dose Misc (Fluticasone-salmeterol) .Marland Kitchen... Take 1 puff two times a day rinse mouth after each use    Proair Hfa 108 (90 Base) Mcg/act Aers (Albuterol sulfate) .Marland Kitchen... 1-2 inhalations q4-6h as needed for wheezing...  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 advised no caffeine etc...  Problem # 3:  HYPERCHOLESTEROLEMIA (ICD-272.0) FLP off meds shows TChol 220/ LDL 165... we discussed restarting the LIPITOR 20mg  Qd & take regularly... Her updated medication list for this problem includes:    Lipitor 20 Mg Tabs (Atorvastatin calcium) .Marland Kitchen... Take one tablet by mouth once daily...  Problem # 4:  DIABETES MELLITUS, TYPE II (ICD-250.00) She states taking 3  meds regularly, but control is sub-optimal... discussed diet, exercise, get wt down 15-20 lbs to avoid incr in meds & poss insulin Rx in future...  Her updated medication list for this problem includes:    Metformin Hcl 500 Mg Tabs (Metformin hcl) .Marland Kitchen... Take 2 tabs by mouth two times a day w/ breakfast & dinner    Glimepiride 4 Mg Tabs (Glimepiride) .Marland Kitchen... Take 1 tab by mouth once daily at breakfast...    Januvia 100 Mg Tabs (Sitagliptin phosphate) .Marland Kitchen... Take 1 tablet by mouth once a day at dinner...  Problem # 5:  GERD (ICD-530.81) Stable>  she requests refill Dexilant... Her updated medication list for this problem includes:    Dexilant 60 Mg Cpdr (Dexlansoprazole) .Marland Kitchen... Take one by mouth once daily...  Problem # 6:  COLONIC POLYPS, ADENOMATOUS, HX OF (ICD-V12.72) GI is stable and up to date...  Problem # 7:  VITAMIN D DEFICIENCY (ICD-268.9) She remains on Vit D 2000 u daily OTC... had BMD 9/08 that was WNL.Marland KitchenMarland Kitchen  Problem # 8:  OTHER MEDIICAL PROBLEMS AS NOTED>>>  Complete Medication List: 1)  Zyrtec Allergy 10 Mg Tabs (Cetirizine hcl) .... As needed 2)  Nasonex 50 Mcg/act Susp (Mometasone furoate) .... 2 puffs in each nostril at bedtime.Marland KitchenMarland Kitchen 3)  Advair Diskus 100-50 Mcg/dose Misc (Fluticasone-salmeterol) .... Take 1 puff two times a day rinse mouth after each use 4)  Proair Hfa 108 (90 Base) Mcg/act Aers (Albuterol sulfate) .Marland Kitchen.. 1-2 inhalations q4-6h as needed for wheezing... 5)  Lipitor 20 Mg Tabs (Atorvastatin calcium) .... Take one tablet by mouth once daily.Marland KitchenMarland Kitchen 6)  Metformin Hcl 500  Mg Tabs (Metformin hcl) .... Take 2 tabs by mouth two times a day w/ breakfast & dinner 7)  Glimepiride 4 Mg Tabs (Glimepiride) .... Take 1 tab by mouth once daily at breakfast... 8)  Januvia 100 Mg Tabs (Sitagliptin phosphate) .... Take 1 tablet by mouth once a day at dinner... 9)  Dexilant 60 Mg Cpdr (Dexlansoprazole) .... Take one by mouth once daily... 10)  Womens Multivitamin Plus Tabs (Multiple  vitamins-minerals) .... Take 1 tab daily... 11)  Vitamin D 1000 Unit Caps (Cholecalciferol) .... Take 2 caps by mouth once daily... 12)  Accu-chek Aviva Strp (Glucose blood) .... Check blood sugar daily  Other Orders: Prescription Created Electronically [id number])  Patient Instructions: 1)  Today we updated your med list- see below.... 2)  We refilled your meds per request... 3)  Remember to take your meds regularly... 4)  Today we reviewed your recent blood work... 5)  Continue your low carb/ low fat diet & increase your exercise program... 6)  Call for any questions.Marland KitchenMarland Kitchen 7)  Please schedule a follow-up appointment in 6 months. Prescriptions: ACCU-CHEK AVIVA  STRP (GLUCOSE BLOOD) check blood sugar daily  #90 x prn   Entered and Authorized by:   Michele Mcalpine MD   Signed by:   Michele Mcalpine MD on 05/24/2009   Method used:   Print then Give to Patient   RxID:   [id number] DEXILANT 60 MG CPDR (DEXLANSOPRAZOLE) take one by mouth once daily...  #90 x 4   Entered and Authorized by:   Michele Mcalpine MD   Signed by:   Michele Mcalpine MD on 05/24/2009   Method used:   Print then Give to Patient   RxID:   [id number] JANUVIA 100 MG  TABS (SITAGLIPTIN PHOSPHATE) Take 1 tablet by mouth once a day at dinner...  #90 x 4   Entered and Authorized by:   Michele Mcalpine MD   Signed by:   Michele Mcalpine MD on 05/24/2009   Method used:   Print then Give to Patient   RxID:   [id number] GLIMEPIRIDE 4 MG TABS (GLIMEPIRIDE) take 1 tab by mouth once daily at breakfast...  #90 x 4   Entered and Authorized by:   Michele Mcalpine MD   Signed by:   Michele Mcalpine MD on 05/24/2009   Method used:   Print then Give to Patient   RxID:   [id number] METFORMIN HCL 500 MG TABS (METFORMIN HCL) take 2 tabs by mouth two times a day w/ breakfast & dinner  #360 x 4   Entered and Authorized by:   Michele Mcalpine MD   Signed by:   Michele Mcalpine MD on 05/24/2009   Method used:   Print then Give to Patient   RxID:    [id number] LIPITOR 20 MG  TABS (ATORVASTATIN CALCIUM) take one tablet by mouth once daily...  #90 x 4   Entered and Authorized by:   Michele Mcalpine MD   Signed by:   Michele Mcalpine MD on 05/24/2009   Method used:   Print then Give to Patient   RxID:   [id number] PROAIR HFA 108 (90 BASE) MCG/ACT  AERS (ALBUTEROL SULFATE) 1-2 inhalations Q4-6H as needed for wheezing...  #1 x prn   Entered and Authorized by:   Michele Mcalpine MD   Signed by:   Michele Mcalpine MD on 05/24/2009   Method used:   Print then Give to Patient  RxID:   [id number] ADVAIR DISKUS 100-50 MCG/DOSE  MISC (FLUTICASONE-SALMETEROL) take 1 puff two times a day rinse mouth after each use  #1 x prn   Entered and Authorized by:   Michele Mcalpine MD   Signed by:   Michele Mcalpine MD on 05/24/2009   Method used:   Print then Give to Patient   RxID:   [id number] NASONEX 50 MCG/ACT  SUSP (MOMETASONE FUROATE) 2 puffs in each nostril at bedtime...  #1 x prn   Entered and Authorized by:   Michele Mcalpine MD   Signed by:   Michele Mcalpine MD on 05/24/2009   Method used:   Print then Give to Patient   RxID:   [id number]

## 2010-02-10 NOTE — Progress Notes (Signed)
Summary: Berniece Andreas to [facility] Note Call from Patient Call back at [facility] [phone]   Caller: Patient Call For: nadel Summary of Call: pt wants a 90 day refill for januvia faxed to [facility]  Initial call taken by: Tivis Ringer, CNA,  December 29, 2009 8:44 AM  Follow-up for Phone Call        Pt aware RX for Januvia sent to [facility]. Follow-up by: Michel Bickers CMA,  December 29, 2009 9:05 AM    Prescriptions: JANUVIA 100 MG  TABS (SITAGLIPTIN PHOSPHATE) Take 1 tablet by mouth once a day at dinner...  #90 x 3   Entered by:   Michel Bickers CMA   Authorized by:   Michele Mcalpine MD   Signed by:   Michel Bickers CMA on 12/29/2009   Method used:   Electronically to        [facility]* (retail)             ,          Ph: [phone]       Fax: [phone]   RxID:   [id number]

## 2010-02-14 ENCOUNTER — Telehealth (INDEPENDENT_AMBULATORY_CARE_PROVIDER_SITE_OTHER): Payer: Self-pay | Admitting: *Deleted

## 2010-02-17 ENCOUNTER — Other Ambulatory Visit: Payer: BC Managed Care – PPO

## 2010-02-17 ENCOUNTER — Encounter (INDEPENDENT_AMBULATORY_CARE_PROVIDER_SITE_OTHER): Payer: Self-pay | Admitting: *Deleted

## 2010-02-17 ENCOUNTER — Encounter: Payer: Self-pay | Admitting: Adult Health

## 2010-02-17 ENCOUNTER — Other Ambulatory Visit: Payer: Self-pay | Admitting: Adult Health

## 2010-02-17 DIAGNOSIS — E7800 Pure hypercholesterolemia, unspecified: Secondary | ICD-10-CM

## 2010-02-17 DIAGNOSIS — I10 Essential (primary) hypertension: Secondary | ICD-10-CM

## 2010-02-17 DIAGNOSIS — D649 Anemia, unspecified: Secondary | ICD-10-CM

## 2010-02-17 DIAGNOSIS — E785 Hyperlipidemia, unspecified: Secondary | ICD-10-CM

## 2010-02-17 DIAGNOSIS — E119 Type 2 diabetes mellitus without complications: Secondary | ICD-10-CM

## 2010-02-17 DIAGNOSIS — E039 Hypothyroidism, unspecified: Secondary | ICD-10-CM

## 2010-02-17 DIAGNOSIS — R748 Abnormal levels of other serum enzymes: Secondary | ICD-10-CM

## 2010-02-17 DIAGNOSIS — N3000 Acute cystitis without hematuria: Secondary | ICD-10-CM

## 2010-02-17 LAB — URINALYSIS
Bilirubin Urine: NEGATIVE
Hgb urine dipstick: NEGATIVE
Leukocytes, UA: NEGATIVE
Nitrite: NEGATIVE
Urobilinogen, UA: 0.2 (ref 0.0–1.0)

## 2010-02-17 LAB — CBC WITH DIFFERENTIAL/PLATELET
Basophils Absolute: 0 10*3/uL (ref 0.0–0.1)
Eosinophils Absolute: 0.3 10*3/uL (ref 0.0–0.7)
Hemoglobin: 13.4 g/dL (ref 12.0–15.0)
Lymphocytes Relative: 24.2 % (ref 12.0–46.0)
MCHC: 34.3 g/dL (ref 30.0–36.0)
Monocytes Relative: 6.7 % (ref 3.0–12.0)
Neutrophils Relative %: 64.7 % (ref 43.0–77.0)
RBC: 4.39 Mil/uL (ref 3.87–5.11)
RDW: 13.2 % (ref 11.5–14.6)

## 2010-02-17 LAB — HEPATIC FUNCTION PANEL
ALT: 38 U/L — ABNORMAL HIGH (ref 0–35)
AST: 28 U/L (ref 0–37)
Albumin: 3.9 g/dL (ref 3.5–5.2)
Alkaline Phosphatase: 81 U/L (ref 39–117)

## 2010-02-17 LAB — BASIC METABOLIC PANEL
CO2: 28 mEq/L (ref 19–32)
Calcium: 9.2 mg/dL (ref 8.4–10.5)
Creatinine, Ser: 0.5 mg/dL (ref 0.4–1.2)
GFR: 150.47 mL/min (ref >60.00)
Glucose, Bld: 127 mg/dL — ABNORMAL HIGH (ref 70–99)
Sodium: 136 mEq/L (ref 135–145)

## 2010-02-17 LAB — LDL CHOLESTEROL, DIRECT: Direct LDL: 163.5 mg/dL

## 2010-02-17 LAB — HEMOGLOBIN A1C: Hgb A1c MFr Bld: 7.5 % — ABNORMAL HIGH (ref 4.6–6.5)

## 2010-02-17 LAB — LIPID PANEL
Cholesterol: 216 mg/dL — ABNORMAL HIGH (ref 0–200)
Total CHOL/HDL Ratio: 5
Triglycerides: 116 mg/dL (ref 0.0–149.0)

## 2010-02-21 ENCOUNTER — Encounter: Payer: Self-pay | Admitting: Pulmonary Disease

## 2010-02-21 ENCOUNTER — Ambulatory Visit (INDEPENDENT_AMBULATORY_CARE_PROVIDER_SITE_OTHER): Payer: BC Managed Care – PPO | Admitting: Pulmonary Disease

## 2010-02-21 DIAGNOSIS — K5730 Diverticulosis of large intestine without perforation or abscess without bleeding: Secondary | ICD-10-CM

## 2010-02-21 DIAGNOSIS — J45909 Unspecified asthma, uncomplicated: Secondary | ICD-10-CM

## 2010-02-21 DIAGNOSIS — Z860100 Personal history of colon polyps, unspecified: Secondary | ICD-10-CM

## 2010-02-21 DIAGNOSIS — K219 Gastro-esophageal reflux disease without esophagitis: Secondary | ICD-10-CM

## 2010-02-21 DIAGNOSIS — E7800 Pure hypercholesterolemia, unspecified: Secondary | ICD-10-CM

## 2010-02-21 DIAGNOSIS — I456 Pre-excitation syndrome: Secondary | ICD-10-CM

## 2010-02-21 DIAGNOSIS — I498 Other specified cardiac arrhythmias: Secondary | ICD-10-CM

## 2010-02-21 DIAGNOSIS — E119 Type 2 diabetes mellitus without complications: Secondary | ICD-10-CM

## 2010-02-24 NOTE — Progress Notes (Signed)
Summary: lab orders---  Phone Note Call from Patient Call back at Home Phone [phone]   Caller: Patient Call For: nadel Summary of Call: patient phoned stated that she has an appointment on Monday and would like to have the orders entered for her lab work. Patient would like a call back at [phone] Initial call taken by: Vedia Coffer,  February 14, 2010 3:31 PM  Follow-up for Phone Call        Spoke with pt and she wants to come in for labs this week for her Monday appt. Please advise what labs to enter. Thanks. Carron Curie CMA  February 14, 2010 4:04 PM Patient called back to see if the order for her labs had been entered yet. She can be reached at [phone]. Also she thinks that she will need to fast. .Vedia Coffer  February 15, 2010 2:17 PM  [facility].  Need date she will come by for labs.Michel Bickers [facility]  February 15, 2010 5:34 PM  Additional Follow-up for Phone Call Additional follow up Details #1::        Labs to order:  FLP  272.0, LFT  790.5,  BMET  401.9,  CBCD  285.9, TSH  244.9,  A1C  250.00,  Vit D  233.00. Awaiting date patient will come by for labs.Michel Bickers [facility]  February 15, 2010 5:38 PM  Pt will come for fasting labwork on Thurs., 02/17/2010. She would like to also add a UA check. Is this okay? Michel Bickers [facility]  February 16, 2010 8:36 AM    Additional Follow-up for Phone Call Additional follow up Details #2::    per leigh it is okay Carver Fila  February 16, 2010 8:48 AM   Lab orders placed in Epic and pt aware. Follow-up by: Vernie Murders,  February 16, 2010 9:11 AM

## 2010-03-02 NOTE — Assessment & Plan Note (Signed)
Summary: 3 month rov/jwr   Primary Care Provider:  Lorin Picket Onalee Steinbach,M.D  CC:  9 month ROV & review of mult medical problems....  History of Present Illness: 55 y/o BF here for a follow up visit... she has multiple medical problems including hx Asthma, hx WPW, Chol, DM, GERD/ Divertics/ Polyps, Vit D defic, Anxiety/ Somatization, & hx poor compliance w/ med rx...   ~  May 24, 2009:  here w/ husb for routine f/u> she admits to stopping her Lipitor 5 months ago due to a bad reaction her husb had w/ this med even though she tolerated it fine... FLP reveals TChol 220, LDL 165 & we discussed restarting this medication regularly ("I have alot of it at home")... she does say that she is taking the 3 DM meds regularly ([facility] [address] does not corroborate regular refills> ?getting mail order Rx) & labs show BS 95, A1c 7.7.Marland KitchenMarland Kitchen  we reviewed diet/ exercise/ & regular dosing recommendations... she notes some incr allergy symptoms and dizziness...   ~  February 21, 2010:    She denies breathing problems or Asthma attacks;  using her inhalers just as needed she says & not inclined to incr to regular preventive dosing...    She tells me that she has once again stopped the Lipitor& FLP is similar to prev w/ TChol 216, LDL 164, & she is content w/ these numbers & refuses to take Statin rx regularly- I offered her any med she wanted (cheaper copay than the Lipitor), vs Lipid Clinic but she declines;  she will work on diet & try to get LDL<130, we discussed low chol, low fat...    She notes BS at home all <145 range;  Labs here show FBS=127, A1c=7.5 (it was 7.7 last time), and she is pleased w/ the improvement on Metform500- 2Bid, Glimep4mg /d, & JANUVIA 100mg /d;  we discussed diet + exercise, & she is considering weight watchers.    Note Vit D level low at 19 and she is supposed to be on a Women's Formula MVI + Vit D OTC supplement of 2000 u daily;  BMD 6/11 here was WNL w/ TScores -0.3 to -0.5;  she is encouraged to take  the Vit D daily.    Current Problem List:  ALLERGIC RHINITIS (ICD-477.9) - controlled on Zyrtek, Nasonex, & Saline...  Note: ENT eval in past was neg.  ASTHMA (ICD-493.90) - controlled on ADVAIR 100 Bid (but only using it Prn now), & PROAIR Prn... uses Mucinex Prn as well.  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 has intermittent mild palpit, none recently...   ~  Myoview 7/06 was normal w/o ischemia, norm wall motion, EF=93%...   ~  2DEcho 7/06 w/ norm LVF...  ~  EKG 6/10 shows NSR, rate 90, NAD...  HYPERCHOLESTEROLEMIA (ICD-272.0) - prev on Lipitor20 & she tolerated it well, but everytime she starts it- she stops it again w/o explanation (once because her husb had a reaction to it)... she declines other statins, declines Lipid Clinic referral, wants to do diet alone.  ~  FLP 9/08 on Lip20 showed TChol 140, TG 89, HDL 37, LDL 85  ~  FLP 6/09 on Lip20 showed TChol 138, TG 55, HDL 35, LDL 92... rec incr exercise, same med.  ~  FLP 6/10 ?on Lip20 showed TChol 181, TG 121, HDL 41, LDL 116... rec> take meds regularly!  ~  FLP  5/11 off med showed TChol 220, TG 143, HDL 47, LDL 165... rec> restart Lip20 every day!  ~  FLP 2/12 on diet alone showed TChol 216, TG 116, HDL 46, LDL 164... she refuses meds, do the best she can w/ diet efforts.  DIABETES MELLITUS, TYPE II (ICD-250.00) - on METFORMIN 500mg - 2Bid, GLIMEPIRIDE 4mg /d, & JANUVIA 100mg /d...  ~  labs 9/08 on GlucovQam showed BS= 122, HgA1c= 7.4... she was told to incr Glucovance Bid- but didn't do it!  ~  labs 6/09 showed BS= 166, HgA1c= 8.2.Marland KitchenMarland Kitchen rec to incr the Glucovance to Bid!!!  ~  12/09: had eye check from DrDolan- no retinopathy...  ~  labs 6/10 showed BS= 161, A1c= 8.1, Microalb= neg... decided to change Rx to Metformin 1000Bid, Glimepiride 4mg /d, Januvia 100mg /d.  ~  10/10: pt reports that she's not taking the Januvia  due to $$, but can fill it now since deduct is met!  ~  labs 5/11 ?on 3 meds? showed BS= 95, A1c= 7.7.Marland KitchenMarland Kitchen rec to get on diet, take meds regularly.  ~  labs 2/12 on 3 meds showed BS= 127, A1c= 7.5.Marland KitchenMarland Kitchen she is happy w/ this result & doesn't want more med.  GERD (ICD-530.81) - on DEXILANT 60mg /d... Omep didn't work for her.  ~  GI eval 9/09 by DrStark- EGD showed gastritis, GERD;    ~  AbdSonar 9/09 showed fatty liver, cyst in left hep lobe, bilat renal cysts & stones.   DIVERTICULOSIS OF COLON (ICD-562.10) COLONIC POLYPS (ICD-211.3)  ~  last colonoscopy was 11/07 showing divertics and 4mm polyp (adenomatous)... f/u planned 100yrs.  RENAL CYST (ICD-593.2) - followed by [facility]... AbdSonar 9/09 showed bilat renal cysts and stones in kidneys.  VITAMIN D DEFICIENCY (ICD-268.9) - supposed to be taking Vit D OTC supplement 2000 u daily.  ~  BMD here 9/08 showed TScores -0.3 in Spine, and -0.4 in left FemNeck  ~  labs 6/10 showed Vit D level = 15 and she was started on OTC Vit D 2000u daily.  ~  BMD here 6/11 was normal w/ TScores -0.5 in Spine, and -0.3 in FemNecks... rec to continue Calcium, MVI, Vit D.  ~  labs 2/12 showed Vit D level = 19 and she is reminded to take supplement daily.  ANXIETY (ICD-300.00) - prev used Alprazolam 0.5mg  Prn but it is no longer on her med list. SOMATIC DYSFUNCTION (ICD-739.9) - hx mult somatic complaints...  Health Maintenance -   ~  GYN: DrMarshall's office- PAP 11/09 was normal according to the pt...   ~  Mammograms at [facility] were OK according to the pt...  ~  Immunizations:  she had Flu shot 2010 at work... ?if they gave her a Pneumonia shot... ?last Tetanus vaccine.   Preventive Screening-Counseling & Management  Alcohol-Tobacco     Smoking Status: never  Caffeine-Diet-Exercise     Does Patient Exercise: yes  Allergies: 1)  ! Penicillin  Comments:  Nurse/Medical Assistant: The patient's medications and allergies were reviewed with the patient and were  updated in the Medication and Allergy Lists.  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 mother  died age and cause unsure father alive age 20 Family History of Breast Cancer: mother Family History of Diabetes: Father No FH of Colon Cancer:  Social History: Reviewed history from 09/11/2007 and no changes required. works for [employer] never smoked no etoh married 4 children Patient gets regular exercise.  Review of Systems      See HPI  The patient denies anorexia, fever, weight loss, weight gain, vision loss, decreased hearing, hoarseness, chest pain, syncope, dyspnea on exertion, peripheral edema, prolonged cough, headaches, hemoptysis, abdominal pain, melena, hematochezia, severe indigestion/heartburn, hematuria, incontinence, muscle weakness, suspicious skin lesions, transient blindness, difficulty walking, depression, unusual weight change, abnormal bleeding, enlarged lymph nodes, and angioedema.    Vital Signs:  Patient profile:   55 year old female Height:      62 inches Weight:      161.38 pounds O2 Sat:      98 % on Room air Temp:     98.0 degrees F oral Pulse rate:   106 / minute BP sitting:   132 / 62  (right arm) Cuff size:   regular  Vitals Entered By: Randell Loop CMA (February 21, 2010 10:24 AM)  O2 Sat at Rest %:  98 O2 Flow:  Room air CC: 9 month ROV  & review of mult medical problems... Is Patient Diabetic? Yes Pain Assessment Patient in pain? no      Comments meds updated today with pt   Physical Exam  Additional Exam:  WD, WN, 54 y/o BF in NAD...   GENERAL:  Alert & oriented; pleasant & cooperative... HEENT:  [place]/AT, EOM-wnl,  EACs-clear, TMs-wnl, NOSE-clear, THROAT-clear & wnl. NECK:  Supple w/ full ROM; no JVD; normal carotid impulses w/o bruits; no thyromegaly or nodules palpated; no lymphadenopathy. CHEST:  Clear to P & A; without wheezes/ rales/ or rhonchi heard... HEART:  Regular Rhythm; without murmurs/ rubs/ or gallops. ABDOMEN:  Soft & nontender; normal bowel sounds; no organomegaly or masses detected, neg CVA tenderness.  EXT: without deformities, mild arthritic changes; no varicose veins/ venous insuffic/ or edema. NEURO:  CN's intact; no focal neuro deficits... DERM:  No lesions noted; no rash etc...    Impression & Recommendations:  Problem # 1:  ASTHMA (ICD-493.90) Hx AR & Asthma>  doing well on all meds just taken Prn... we discussed starting antihist & Nasonex prior to her allergy season & she understands this concept... similarly asked to use the Advair regularly as preventive, but she prefers to wait for symptoms... Her updated medication list for this problem includes:    Advair Diskus 100-50 Mcg/dose Misc (Fluticasone-salmeterol) .Marland Kitchen... Take 1 puff two times a day rinse mouth after each use as needed    Proair Hfa 108 (90 Base) Mcg/act Aers (Albuterol sulfate) .Marland Kitchen... 1-2 inhalations q4-6h as needed for wheezing...  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  Problem # 3:  HYPERCHOLESTEROLEMIA (ICD-272.0) She clearly does not want Statin rx for her Chol as she continually stops it every time- she can't really say why... offered other statin choices vs lipid clinic but she declines, she is  content to treat w/ diet alone... The following medications were removed from the medication list:    Lipitor 20 Mg Tabs (Atorvastatin calcium) .Marland Kitchen... Take one tablet by mouth once daily...  Problem # 4:  DIABETES MELLITUS, TYPE II (ICD-250.00) She states that she has been regular w/ her  3 DM meds + diet etc... A1c= 7.5 & she is happy w/ the improvement, doesn't want additional meds... we discussed diet/ exercise/ get wt down a few lbs. Her updated medication list for this problem includes:    Metformin Hcl 500 Mg Tabs (Metformin hcl) .Marland Kitchen... Take 2 tabs by mouth two times a day w/ breakfast & dinner    Glimepiride 4 Mg Tabs (Glimepiride) .Marland Kitchen... Take 1 tab by mouth once daily at breakfast...    Januvia 100 Mg Tabs (Sitagliptin phosphate) .Marland Kitchen... Take 1 tablet by mouth once a day at dinner...  Problem # 5:  GERD (ICD-530.81) GI is stable & followed by DrStark... due for colonoscopy 11/12... Her updated medication list for this problem includes:    Dexilant 60 Mg Cpdr (Dexlansoprazole) .Marland Kitchen... Take one by mouth once daily...  Problem # 6:  VITAMIN D DEFICIENCY (ICD-268.9) She is supposed to be on Vit D 2000 u OTC supplement & reminded again... BMD 6/11 remains WNL.Marland KitchenMarland Kitchen  Problem # 7:  OTHER MEDICAL PROBLEMS AS NOTED>>>  Complete Medication List: 1)  Zyrtec Allergy 10 Mg Tabs (Cetirizine hcl) .... As needed 2)  Nasonex 50 Mcg/act Susp (Mometasone furoate) .... 2 puffs in each nostril at bedtime as needed 3)  Advair Diskus 100-50 Mcg/dose Misc (Fluticasone-salmeterol) .... Take 1 puff two times a day rinse mouth after each use as needed 4)  Proair Hfa 108 (90 Base) Mcg/act Aers (Albuterol sulfate) .Marland Kitchen.. 1-2 inhalations q4-6h as needed for wheezing... 5)  Metformin Hcl 500 Mg Tabs (Metformin hcl) .... Take 2 tabs by mouth two times a day w/ breakfast & dinner 6)  Glimepiride 4 Mg Tabs (Glimepiride) .... Take 1 tab by mouth once daily at breakfast... 7)  Januvia 100 Mg Tabs (Sitagliptin phosphate) ....  Take 1 tablet by mouth once a day at dinner... 8)  Dexilant 60 Mg Cpdr (Dexlansoprazole) .... Take one by mouth once daily.Marland KitchenMarland Kitchen 9)  Womens Multivitamin Plus Tabs (Multiple vitamins-minerals) .... Take 1 tab daily... 10)  Vitamin D 1000 Unit Caps (Cholecalciferol) .... Take 2 caps by mouth once daily... 11)  Accu-chek Aviva Strp (Glucose blood) .... Check blood sugar daily  Patient Instructions: 1)  Today we updated your med list- see below.... 2)  We refilled your meds for 90d suppies as requested... 3)  We reviewed you recent blood work & gave you a copy... 4)  Let's get on track w/ our diet & exercise program... the goal is to lose 10-15 lbs... 5)  Call for any questions.Marland KitchenMarland Kitchen 6)  Please schedule a follow-up appointment in 6 months. Prescriptions: PROAIR HFA 108 (90 BASE) MCG/ACT  AERS (ALBUTEROL SULFATE) 1-2 inhalations Q4-6H as needed for wheezing...  #1 x 6   Entered and Authorized by:   Michele Mcalpine MD   Signed by:   Michele Mcalpine MD on 02/21/2010   Method used:   Print then Give to Patient   RxID:   [id number] ADVAIR DISKUS 100-50 MCG/DOSE  MISC (FLUTICASONE-SALMETEROL) take 1 puff two times a day rinse mouth after each use as needed  #1 x 6   Entered and Authorized by:   Michele Mcalpine MD   Signed by:   Michele Mcalpine MD on 02/21/2010   Method used:   Print then Give to Patient   RxID:   [id number] NASONEX 50 MCG/ACT  SUSP (MOMETASONE FUROATE) 2 puffs in each nostril at bedtime as needed  #1 x 6   Entered and Authorized by:   Lonzo Cloud  Kriste Basque MD   Signed by:   Michele Mcalpine MD on 02/21/2010   Method used:   Print then Give to Patient   RxID:   [id number] DEXILANT 60 MG CPDR (DEXLANSOPRAZOLE) take one by mouth once daily...  #90 x 4   Entered and Authorized by:   Michele Mcalpine MD   Signed by:   Michele Mcalpine MD on 02/21/2010   Method used:   Print then Give to Patient   RxID:   [id number] JANUVIA 100 MG  TABS (SITAGLIPTIN PHOSPHATE) Take 1 tablet by mouth once  a day at dinner...  #90 x 4   Entered and Authorized by:   Michele Mcalpine MD   Signed by:   Michele Mcalpine MD on 02/21/2010   Method used:   Print then Give to Patient   RxID:   [id number] GLIMEPIRIDE 4 MG TABS (GLIMEPIRIDE) take 1 tab by mouth once daily at breakfast...  #90 x 4   Entered and Authorized by:   Michele Mcalpine MD   Signed by:   Michele Mcalpine MD on 02/21/2010   Method used:   Print then Give to Patient   RxID:   [id number] METFORMIN HCL 500 MG TABS (METFORMIN HCL) take 2 tabs by mouth two times a day w/ breakfast & dinner  #360 x 4   Entered and Authorized by:   Michele Mcalpine MD   Signed by:   Michele Mcalpine MD on 02/21/2010   Method used:   Print then Give to Patient   RxID:   [id number]    Immunization History:  Influenza Immunization History:    Influenza:  historical (10/18/2009)

## 2010-04-19 ENCOUNTER — Encounter: Payer: Self-pay | Admitting: Pulmonary Disease

## 2010-04-25 ENCOUNTER — Encounter: Payer: Self-pay | Admitting: Adult Health

## 2010-04-25 ENCOUNTER — Ambulatory Visit (INDEPENDENT_AMBULATORY_CARE_PROVIDER_SITE_OTHER): Payer: BC Managed Care – PPO | Admitting: Adult Health

## 2010-04-25 VITALS — BP 138/94 | HR 100 | Temp 97.9°F | Ht 62.0 in | Wt 161.0 lb

## 2010-04-25 DIAGNOSIS — J019 Acute sinusitis, unspecified: Secondary | ICD-10-CM

## 2010-04-25 MED ORDER — CEFDINIR 300 MG PO CAPS: 300.0000 mg | ORAL_CAPSULE | Freq: Two times a day (BID) | ORAL | Status: AC

## 2010-04-25 NOTE — Progress Notes (Signed)
Subjective:    Patient ID: Lindsay Henderson, female    DOB: [birth year], 55 y.o.   MRN: [id number]  HPI 55 y/o AAF with known hx of  Asthma, hx WPW, Chol, DM, GERD/ Divertics/ Polyps, Vit D defic, Anxiety/ Somatization, & hx poor compliance w/ med rx...   ~ May 24, 2009: here w/ husb for routine f/u> she admits to stopping her Lipitor 5 months ago due to a bad reaction her husb had w/ this med even though she tolerated it fine... FLP reveals TChol 220, LDL 165 & we discussed restarting this medication regularly ("I have alot of it at home")... she does say that she is taking the 3 DM meds regularly ([facility] [address] does not corroborate regular refills> ?getting mail order Rx) & labs show BS 95, A1c 7.7.Marland KitchenMarland Kitchen we reviewed diet/ exercise/ & regular dosing recommendations... she notes some incr allergy symptoms and dizziness...   ~ February 21, 2010:  She denies breathing problems or Asthma attacks; using her inhalers just as needed she says & not inclined to incr to regular preventive dosing...  She tells me that she has once again stopped the Lipitor& FLP is similar to prev w/ TChol 216, LDL 164, & she is content w/ these numbers & refuses to take Statin rx regularly- I offered her any med she wanted (cheaper copay than the Lipitor), vs Lipid Clinic but she declines; she will work on diet & try to get LDL<130, we discussed low chol, low fat...  She notes BS at home all <145 range; Labs here show FBS=127, A1c=7.5 (it was 7.7 last time), and she is pleased w/ the improvement on Metform500- 2Bid, Glimep4mg /d, & JANUVIA 100mg /d; we discussed diet + exercise, & she is considering weight watchers.  Note Vit D level low at 19 and she is supposed to be on a Women's Formula MVI + Vit D OTC supplement of 2000 u daily; BMD 6/11 here was WNL w/ TScores -0.3 to -0.5; she is encouraged to take the Vit D daily.    04/25/2010 Acute OV  Presents for an acute office visit. Complains of sinus infection > HA, sinus  pressure/congestion, thick green/yellow/bloody mucus, PND, prod cough w/ same-colored mucus, wheezing, SOB, low grade temp x1week.  Taking mulitple meds without help. Took zyrtec, mucinex and vicks . Sinus pain is getting worse.  Using saline nasal spray last few days.  Worse in am and At bedtime    Review of Systems Constitutional:   No  weight loss, night sweats,  Fevers, chills,  HEENT:   No headaches,  Difficulty swallowing,  Tooth/dental problems, or  Sore throat,                + sneezing, itching, ear ache, nasal congestion, post nasal drip,   CV:  No chest pain,  Orthopnea, PND, swelling in lower extremities, anasarca, dizziness, palpitations, syncope.   GI  No heartburn, indigestion, abdominal pain, nausea, vomiting, diarrhea, change in bowel habits, loss of appetite, bloody stools.   Resp: No shortness of breath with exertion or at rest.  No excess mucus, no productive cough,  No non-productive cough,  No coughing up of blood.  No change in color of mucus.  No wheezing.  No chest wall deformity  Skin: no rash or lesions.  GU: no dysuria, change in color of urine, no urgency or frequency.  No flank pain, no hematuria   MS:  No joint pain or swelling.  No decreased range of motion.  No back pain.  Psych:  No change in mood or affect. No depression or anxiety.  No memory loss.          Objective:   Physical Exam GEN: A/Ox3; pleasant , NAD, well nourished   HEENT:  [place]/AT,  EACs-clear, TMs-wnl, NOSE-clear drainage , max sinus tenderness, THROAT-clear, no lesions, no postnasal drip or exudate noted.   NECK:  Supple w/ fair ROM; no JVD; normal carotid impulses w/o bruits; no thyromegaly or nodules palpated; no lymphadenopathy.  RESP  Clear  P & A; w/o, wheezes/ rales/ or rhonchi.no accessory muscle use, no dullness to percussion  CARD:  RRR, no m/r/g  , no peripheral edema, pulses intact, no cyanosis or clubbing.  GI:   Soft & nt; nml bowel sounds; no organomegaly or  masses detected.  Musco: Warm bil, no deformities or joint swelling noted.   Neuro: alert, no focal deficits noted.    Skin: Warm, no lesions or rashes         Assessment & Plan:

## 2010-04-25 NOTE — Patient Instructions (Signed)
Omnicef 300mg  Twice daily  For 10 days Mucinex DM Twice daily  As needed  Congestion Saline nasal rinses As needed   Tylenol As needed  Fluids and rest  Please contact office for sooner follow up if symptoms do not improve or worsen or seek emergency care

## 2010-04-25 NOTE — Assessment & Plan Note (Addendum)
Acute flare;  Plan:  She has PCN allergy but says she can take cephalosporins Omnicef 300mg  Twice daily  For 10 days Mucinex DM Twice daily  As needed  Congestion Saline nasal rinses As needed   Tylenol As needed  Fluids and rest  Please contact office for sooner follow up if symptoms do not improve or worsen or seek emergency care

## 2010-05-27 NOTE — Op Note (Signed)
NAME:  Lindsay Henderson, Lindsay Henderson                      ACCOUNT NO.:  [id number]   MEDICAL RECORD NO.:  [id number]                   PATIENT TYPE:  OIB   LOCATION:  2899                                 FACILITY:  [facility]   PHYSICIAN:  Doylene Canning. Ladona Ridgel, M.D. [facility]           DATE OF BIRTH:  02-05-1955   DATE OF PROCEDURE:  02/11/2002  DATE OF DISCHARGE:                                 OPERATIVE REPORT   PROCEDURE:  Invasive electrophysiologic study and radiofrequency catheter  ablation of arterioventricular re-entrant tachycardia, etiologically-  concealed left lateral accessory pathway.   OPERATOR:  Doylene Canning. Ladona Ridgel, M.D.   INDICATIONS FOR PROCEDURE:  The patient is a very pleasant 55 year old woman  with a long history of tachy palpitations which have increased in frequency  and severity.  She has had documented SVT on cardiac monitoring.  The  patient despite medical therapy continues to have symptoms, and she is now  referred for an electrophysiologic study and RF catheter ablation.   DESCRIPTION OF PROCEDURE:  After an informed consent was obtained, the  patient was taken to the diagnostic EP laboratory in the fasting state.  After the usual preparation and draping, intravenous fentanyl and midazolam  was given for sedation.  A 6-French hexapolar catheter was inserted  percutaneously in the right jugular vein and advanced to the coronary sinus.  A 5-French quadripolar catheter was inserted percutaneously in the right  femoral vein and advanced to the RV apex.  A 5-French quadripolar catheter  was inserted percutaneously in the right femoral vein and advanced to the  His bundle region.  During catheter insertion the patient developed a narrow  QRS tachycardia at a rate that varied between 170-200 beats per minute.  During tachycardia the atrial activation sequence was earliest along the  lateral wall of the left atrium, consistent with a concealed left lateral  accessory pathway.  PVCs placed  at the time of His bundle refractiveness  during SVT resulted in pre-excitation of the atria.  In addition,  ventricular pacing results in a VAV conduction sequence.  At this point, the  tachycardia was terminated with a combination of coronary sinus and  ventricular pacing.  Rapid ventricular pacing was then carried out from the  RV apex and step-wise decreased from 490 msec down to 320 msec.  During  rapid ventricular pacing, atrial activation sequence was eccentric and non-  decremental.  At a pacing internal of 320 msec, the retrograde accessory  pathway ERP was observed.  Next, programmed ventricular stimulation was carried out from the RV apex at  a base  stress cycle length of 500 msec.  The S1 and S2 intervals were step-  wise decreased from 440 msec, down to 290 msec, with a retrograde accessory  pathway.  ERP was observed.  Once again, during programmed ventricular  stimulation the atrial activation sequence was non-decremental and  eccentric.   Next, programmed atrial stimulation was carried out from  the coronary sinus  at a base stress cycle length of 500 msec.  There was an S1 and S2 coupling  interval of 500/440.  Pacing from the coronary sinus resulted in the  initiation of SVT.  It should be noted that during pacing from the lateral  left atrium, the ventricular activation was in fact pre-excited.  With the  initiation of tachycardia, there was loss of pre-excitation.  At this point,  the right femoral artery was punctured and the ablation catheter was  advanced retrograde across the aortic valve into the left ventricle.  Mapping was carried out along the mitral annulus.  It should be noted that  with the initial Cordis-D curved catheter, placement of the catheter along  the mitral annulus was quite difficult.  Ten RF energy applications were  delivered with this catheter, but pre-excitation remained and inducible  tachycardia remained.  The EPT bi-directional catheter was  then advanced  retrograde across the aortic valve, and this catheter was placed in the left  atrium and gradually withdrawn to the mitral annulus.  At this location  there was a continuous A&V in tachycardia.  A single RF energy application  delivered to this region resulted in termination  of SVT and ventricular  pacing demonstrated VA dissociation.  Four bonus RF energy applications were  delivered along this region, which was approximately between 3 and 4 o'clock  on the mitral annulus.  Following this the patient was observed for 45  minutes, and had continued VA dissociation and no evidence of residual  inducible SVT.  The catheters were then removed.  Hemostasis was assured,  and the patient was returned to her room in satisfactory condition.   COMPLICATIONS:  There were no immediate procedure complications.   RESULTS:  1. Baseline ECG:  The baseline ECG demonstrates a normal sinus rhythm with     no overt pre-excitation.  2. Baseline intervals:  The HV interval was 39 msec.  The sinus node cycle     length at baseline was 620 msec.  The PR interval was 110 msec.  3. Rapid ventricular pacing:  Rapid ventricular pacing was carried out from     the RV apex and step-wise decreased down to 320 msec, where the     retrograde accessory pathway and ERP were observed.  During rapid     ventricular pacing the atrial activation sequence was eccentric and non-     decremental.  4. Programmed ventricular stimulation:  Programmed ventricular stimulation     was carried out from the RV apex at a base stress cycle length of 500     msec.  The S1 and S2 intervals were step-wise decreased from 440 msec     down to 290 msec before the retrograde accessory pathway ERP was     observed.  During programmed ventricular stimulation the atrial     activation sequence was eccentric and decremental. 5. Rapid atrial pacing:  Rapid atrial pacing following the catheter ablation     demonstrated an AV  Wenckebach cycle length of 320 msec.  6. Programmed atrial stimulation:  Programmed atrial stimulation prior to RF     energy application results in the initiation of SVT and an S1 and S2     coupling interval of 500/440.  Following catheter ablation the AV node     ERP was 500/230.  7. Arrhythmias observed:  AV re-entrant tachycardia.  Initiation spontaneous     and with rapid atrial pacing, duration sustained.  Cycle length 330 msec.     Termination with rapid atrial and ventricular pacing and catheter     ablation.  8. Mapping:  Mapping of the patient's SVT demonstrated a far left lateral     accessory pathway.  9. RF energy application:  A total of 15 RF energy applications were     delivered.  During the 11th RF energy application, VA dissociation was     demonstrated and 4 bonus RF energy applications were delivered.   CONCLUSION:  This study demonstrates successful electrophysiologic study and  radiofrequency catheter ablation of a concealed left lateral accessory  pathway with inducible atrioventricular re-entrant tachycardia.  The  tachycardia was successfully ablated with a total of 15 radiofrequency  energy applications, delivered both to the atrial as well as the ventricular  insertion of the accessory pathway along the mitral annulus.                                               Doylene Canning. Ladona Ridgel, M.D. [facility]    GWT/MEDQ  D:  02/11/2002  T:  02/11/2002  Job:  [id number]   cc:   Lonzo Cloud. Kriste Basque, M.D. [facility]   Kathrine Cords, N.P.  [facility]

## 2010-05-27 NOTE — Discharge Summary (Signed)
NAME:  Lindsay Henderson, Lindsay Henderson                      ACCOUNT NO.:  [id number]   MEDICAL RECORD NO.:  [id number]                   PATIENT TYPE:  OIB   LOCATION:  4709                                 FACILITY:  [facility]   PHYSICIAN:  Chinita Pester, C.R.N.P. [facility]           DATE OF BIRTH:  1955/08/28   DATE OF ADMISSION:  02/11/2002  DATE OF DISCHARGE:  02/12/2002                                 DISCHARGE SUMMARY   PRIMARY DIAGNOSIS:  Supraventricular tachycardia.   HISTORY OF PRESENT ILLNESS:  The patient is a 55 year old female with a  history of tachy palpitations which are consistent with supraventricular  tachycardia. She was seen in December. At that time, a 30 day cardiac  monitor was ordered that subsequently demonstrated multiple episodes of  narrow QRS  tachycardia with rates of 170. In addition, the patient's  Cardiolite stress testing demonstrated no obvious ischemia and normal LV  function by echocardiogram. Denies chest pain, shortness of breath. Notes  that since she was seen in December, she had several episodes of tachy  palpitations, one which required presentation to the emergency room. EKG  during tachycardia demonstrated narrow QRS with 170 beats. This is a short  RP tachycardia. The patient was admitted and underwent a concealed left  lateral accessory pathway ablation. Postoperatively, she had some  hypotension along with chest pain. A 2-D echocardiogram was performed in the  OR, which showed no effusions. She was given 250 mg normal saline bolus  times two over an hour each. The patient was also having frequent bigeminy  and premature ventricular contractions. Her potassium and magnesium were  checked. Her potassium was 3.5 and magnesium was 1.5. Both were repleated.  The following day, her potassium was 4.9 with a magnesium of 2.0. Her  hemoglobin and hematocrit was also monitored. Initial hemoglobin and  hematocrit was 12.3 and 35.5. The following morning hemoglobin and  hematocrit was 12.2 and 35.5. She had no further chest pain. The patient was  having no flank pain. She was given a dose of Indocin for possible pruritus  and discharged to home on the following medications.   DISCHARGE MEDICATIONS:  1. Prevacid 30 mg daily.  2. Glucophage 500 mg daily.  3. Coated aspirin 81 mg daily.  4. Lipitor 20 mg daily.  5. To take antibiotics before any GYN or dental procedures for the next six     months.  6. She is to take Aleve daily until the chest pain is resolved.   ACTIVITY:  No heavy lifting or strenuous activity for four days. No driving  for two days.    SPECIAL INSTRUCTIONS:  She is to call if she develops a lump in her groin,  any drainage, severe chest pain or other symptoms.   FOLLOW UP:  She was to see Dr. Ladona Ridgel within six weeks and the office will  call to schedule that appointment.  Chinita Pester, C.R.N.P. [facility]    DS/MEDQ  D:  02/12/2002  T:  02/12/2002  Job:  [id number]   cc:   Lonzo Cloud. Kriste Basque, M.D. [facility]   Doylene Canning. Ladona Ridgel, M.D. [facility]  [address]  [place]  [place] [address]  Fax: 1

## 2010-06-08 ENCOUNTER — Telehealth: Payer: Self-pay | Admitting: Pulmonary Disease

## 2010-06-08 NOTE — Telephone Encounter (Signed)
3 samples of Januvia 100mg  left at front for pick up -- pt aware.  Lot # [id number] Exp Date Jan 2013

## 2010-07-07 ENCOUNTER — Telehealth: Payer: Self-pay | Admitting: Pulmonary Disease

## 2010-07-07 ENCOUNTER — Other Ambulatory Visit: Payer: Self-pay | Admitting: Gastroenterology

## 2010-07-07 NOTE — Telephone Encounter (Signed)
Pt states she cannot afford Dexilant prescription any longer and its just too expensive. Informed patient that she needs to schedule a follow up visit before we can send her any refills. Pt states she really would like some samples while she is waiting on a appt. I told her that I can leave a couple of boxes out front for her to pick up and to schedule a follow up visit. Pt agreed and verbalized understanding.

## 2010-07-07 NOTE — Telephone Encounter (Signed)
Called and spoke with pt and she is aware that samples of the [place] have been left up front for the pt.

## 2010-09-27 ENCOUNTER — Other Ambulatory Visit (INDEPENDENT_AMBULATORY_CARE_PROVIDER_SITE_OTHER): Payer: BC Managed Care – PPO

## 2010-09-27 ENCOUNTER — Other Ambulatory Visit: Payer: Self-pay | Admitting: Pulmonary Disease

## 2010-09-27 DIAGNOSIS — E7800 Pure hypercholesterolemia, unspecified: Secondary | ICD-10-CM

## 2010-09-27 DIAGNOSIS — E119 Type 2 diabetes mellitus without complications: Secondary | ICD-10-CM

## 2010-09-27 DIAGNOSIS — E559 Vitamin D deficiency, unspecified: Secondary | ICD-10-CM

## 2010-09-27 DIAGNOSIS — N3000 Acute cystitis without hematuria: Secondary | ICD-10-CM

## 2010-09-27 LAB — HEMOGLOBIN A1C: Hgb A1c MFr Bld: 7.7 % — ABNORMAL HIGH (ref 4.6–6.5)

## 2010-09-27 LAB — HEPATIC FUNCTION PANEL
Albumin: 4.6 g/dL (ref 3.5–5.2)
Alkaline Phosphatase: 96 U/L (ref 39–117)

## 2010-09-27 LAB — URINALYSIS
Leukocytes, UA: NEGATIVE
Nitrite: NEGATIVE
Specific Gravity, Urine: <=1.005 (ref 1.000–1.030)
Urine Glucose: NEGATIVE
Urobilinogen, UA: 0.2 (ref 0.0–1.0)

## 2010-09-27 LAB — BASIC METABOLIC PANEL
BUN: 10 mg/dL (ref 6–23)
GFR: 117.05 mL/min (ref >60.00)
Potassium: 4.5 mEq/L (ref 3.5–5.1)
Sodium: 141 mEq/L (ref 135–145)

## 2010-09-28 ENCOUNTER — Encounter: Payer: Self-pay | Admitting: Pulmonary Disease

## 2010-09-29 ENCOUNTER — Ambulatory Visit (INDEPENDENT_AMBULATORY_CARE_PROVIDER_SITE_OTHER): Payer: BC Managed Care – PPO | Admitting: Pulmonary Disease

## 2010-09-29 ENCOUNTER — Encounter: Payer: Self-pay | Admitting: Pulmonary Disease

## 2010-09-29 DIAGNOSIS — M999 Biomechanical lesion, unspecified: Secondary | ICD-10-CM

## 2010-09-29 DIAGNOSIS — I498 Other specified cardiac arrhythmias: Secondary | ICD-10-CM

## 2010-09-29 DIAGNOSIS — E559 Vitamin D deficiency, unspecified: Secondary | ICD-10-CM

## 2010-09-29 DIAGNOSIS — J45909 Unspecified asthma, uncomplicated: Secondary | ICD-10-CM

## 2010-09-29 DIAGNOSIS — E7800 Pure hypercholesterolemia, unspecified: Secondary | ICD-10-CM

## 2010-09-29 DIAGNOSIS — K219 Gastro-esophageal reflux disease without esophagitis: Secondary | ICD-10-CM

## 2010-09-29 DIAGNOSIS — K5730 Diverticulosis of large intestine without perforation or abscess without bleeding: Secondary | ICD-10-CM

## 2010-09-29 DIAGNOSIS — J309 Allergic rhinitis, unspecified: Secondary | ICD-10-CM

## 2010-09-29 DIAGNOSIS — F411 Generalized anxiety disorder: Secondary | ICD-10-CM

## 2010-09-29 DIAGNOSIS — E119 Type 2 diabetes mellitus without complications: Secondary | ICD-10-CM

## 2010-09-29 MED ORDER — VITAMIN D (ERGOCALCIFEROL) 1.25 MG (50000 UNIT) PO CAPS: 50000.0000 [IU] | ORAL_CAPSULE | ORAL | Status: DC

## 2010-09-29 MED ORDER — ALBUTEROL SULFATE HFA 108 (90 BASE) MCG/ACT IN AERS: 2.0000 | INHALATION_SPRAY | Freq: Four times a day (QID) | RESPIRATORY_TRACT | Status: DC | PRN

## 2010-09-29 NOTE — Patient Instructions (Signed)
Today we updated your med list in EPIC...    Because your Vit D level is still low, we decided to Rx w/ a prescription Vit D 50,000u once per week...  We will arrange for a Diabetes consultation w/ DrEllison> continue your same 3 meds for now...    We will also arrange for a Nutrition consult at the [facility]...  We will also send a reminder to DrStark regarding your colonoscopy which is due in November...  Call for any questions...  Let's plan another follow up visit in 6 months.Marland KitchenMarland Kitchen

## 2010-09-30 ENCOUNTER — Ambulatory Visit: Payer: BC Managed Care – PPO | Admitting: Pulmonary Disease

## 2010-10-03 ENCOUNTER — Encounter: Payer: Self-pay | Admitting: Pulmonary Disease

## 2010-10-03 NOTE — Progress Notes (Signed)
Subjective:    Patient ID: Lindsay Henderson, female    DOB: Dec 19, 1955, 55 y.o.   MRN: [id number]  HPI 54 y/o BF here for a follow up visit... she has multiple medical problems including hx Asthma, hx WPW, Chol, DM, GERD/ Divertics/ Polyps, Vit D defic, Anxiety/ Somatization, & hx poor compliance w/ med rx...  ~  May 24, 2009:  here w/ husb for routine f/u> she admits to stopping her Lipitor 5 months ago due to a bad reaction her husb had w/ this med even though she tolerated it fine... FLP reveals TChol 220, LDL 165 & we discussed restarting this medication regularly ("I have alot of it at home")... she does say that she is taking the 3 DM meds regularly ([facility] on [address] does not corroborate regular refills> ?getting mail order Rx) & labs show BS 95, A1c 7.7.Marland KitchenMarland Kitchen  we reviewed diet/ exercise/ & regular dosing recommendations... she notes some incr allergy symptoms and dizziness...  ~  February 21, 2010:    She denies breathing problems or Asthma attacks;  using her inhalers just as needed she says & not inclined to incr to regular preventive dosing...    She tells me that she has once again stopped the Lipitor& FLP is similar to prev w/ TChol 216, LDL 164, & she is content w/ these numbers & refuses to take Statin rx regularly- I offered her any med she wanted (cheaper copay than the Lipitor), vs Lipid Clinic but she declines;  she will work on diet & try to get LDL<130, we discussed low chol, low fat...    She notes BS at home all <145 range;  Labs here show FBS=127, A1c=7.5 (it was 7.7 last time), and she is pleased w/ the improvement on Metform500- 2Bid, Glimep4mg /d, & JANUVIA 100mg /d;  we discussed diet + exercise, & she is considering weight watchers.    Note Vit D level low at 19 and she is supposed to be on a Women's Formula MVI + Vit D OTC supplement of 2000 u daily;  BMD 6/11 here was WNL w/ TScores -0.3 to -0.5;  she is encouraged to take the Vit D daily.  ~  September 29, 2010:  18mo ROV &  she requests refills for all meds 90d supplies; she declines to receive the 2012 Flu vaccine...    AR & Asthma> on Advair100 but only using this Prn she says (plus Proair HFA as needed); she uses Zyrtek & Nasonex for her nasal allergies; denies breathing problems or asthma exac...    Hx WPW & SVT> followed by DrTaylor w/ hx catheter ablation in 2004; she denies palpit, dizzy, syncope, etc...    CHOL> she has been recommended to take statin therapy but she declines preferring diet alone despite her poor numbers...    DM> on Metformin500-2Bid, Glimep4mg /d, Januvia100mg /d; plus DM diet efforts; today BS=129, A1c=7.7, and she wants to avoid insulin therefore refer to Endocrine/DM; also wants [facility] consult.    GERD> on Dexilant60mg /d & followed by DrStark (Omep doesn't work for her)...    Divertics, Polyps> she had 4mm adenomatous polyp removed 11/07 & 52yr f/u colon due soon, we will send memo to GI/ DrStark...    Elev LFTs> mild incr LFTs, likely FLD (per sonar 2009), she weighs 162# (no change) & we discussed diet, exercise, get wt down...    Vit D Defic> on VitD 1000u OTC daily supplement; but VitD level has only improved to 21 & we will switch to VitD  50K weekly...    Anxiety> she has mult somatic complaints, prev used alprazolam as needed but it is no longer on her list...          Problem List:  ALLERGIC RHINITIS (ICD-477.9) - controlled on Zyrtek, Nasonex, & Saline...  Note: ENT eval in past was neg.  ASTHMA (ICD-493.90) - controlled on ADVAIR 100 Bid (but only using it Prn now), & PROAIR Prn... uses Mucinex Prn as well. ~  She denies any recent asthma exac...  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 ~  Myoview 7/06 was normal w/o ischemia, norm wall motion, EF=93%...  ~  2DEcho 7/06 w/ norm LVF... ~  EKG 6/10 shows NSR, rate 90, NAD... ~  She has intermittent mild  palpit, none recently...   HYPERCHOLESTEROLEMIA (ICD-272.0) - prev on Lipitor20 & she tolerated it well, but everytime she starts it- she stops it again w/o explanation (once because her husb had a reaction to it); she declines other statins, declines Lipid Clinic referral, wants to do diet alone. ~  FLP 9/08 on Lip20 showed TChol 140, TG 89, HDL 37, LDL 85 ~  FLP 6/09 on Lip20 showed TChol 138, TG 55, HDL 35, LDL 92... rec incr exercise, same med. ~  FLP 6/10 ?on Lip20 showed TChol 181, TG 121, HDL 41, LDL 116... rec> take meds regularly! ~  FLP 5/11 off med showed TChol 220, TG 143, HDL 47, LDL 165... rec> restart Lip20 every day! ~  FLP 2/12 on diet alone showed TChol 216, TG 116, HDL 46, LDL 164... she refuses meds, & will do the best she can on diet alone.  DIABETES MELLITUS, TYPE II (ICD-250.00) - on METFORMIN 500mg - 2Bid, GLIMEPIRIDE 4mg /d, & JANUVIA 100mg /d... ~  labs 9/08 on GlucovQam showed BS= 122, HgA1c= 7.4... she was told to incr Glucovance Bid- but didn't do it! ~  labs 6/09 showed BS= 166, HgA1c= 8.2.Marland KitchenMarland Kitchen rec to incr the Glucovance to Bid!!! ~  12/09: had eye check from DrDolan- no retinopathy... ~  labs 6/10 showed BS= 161, A1c= 8.1, Microalb= neg... decided to change Rx to Metformin 1000Bid, Glimepiride 4mg /d, Januvia 100mg /d. ~  10/10: pt reports that she's not taking the Januvia due to $$, but can fill it now since deduct is met! ~  labs 5/11 ?on 3 meds? showed BS= 95, A1c= 7.7.Marland KitchenMarland Kitchen rec to get on diet, take meds regularly. ~  labs 2/12 on 3 meds showed BS= 127, A1c= 7.5.Marland KitchenMarland Kitchen she is happy w/ this result & doesn't want more med. ~  She saw DrDolan for optometry 3/12 & no retinopathy seen... ~  Labs 9/12 showed BS= 129, A1c= 7.7, and we will refer pt to Endocrine/DM in light of her worsening A1c on 3 max oral meds (also refer to [facility] per request).  GERD (ICD-530.81) - on DEXILANT 60mg /d... Omep didn't work for her. ~  GI eval 9/09 by DrStark- EGD showed gastritis, GERD;   ~   AbdSonar 9/09 showed fatty liver, cyst in left hep lobe, bilat renal cysts & stones.  ~  Hx mild elev LFTs likely due to fatty liver disease...  DIVERTICULOSIS OF COLON (ICD-562.10) COLONIC POLYPS (ICD-211.3) ~  last colonoscopy was 11/07 showing divertics and 4mm polyp (adenomatous)... f/u planned 40yrs. ~  9/12:  We will send reminder to DrStark for colonoscopy due...  RENAL CYST (ICD-593.2) - followed by FAOZHYQM... AbdSonar 9/09 showed  bilat renal cysts and stones in kidneys.  VITAMIN D DEFICIENCY (ICD-268.9) - supposed to be taking Vit D OTC supplement 2000 u daily. ~  BMD here 9/08 showed TScores -0.3 in Spine, and -0.4 in left FemNeck ~  labs 6/10 showed Vit D level = 15 and she was started on OTC Vit D 2000u daily. ~  BMD here 6/11 was normal w/ TScores -0.5 in Spine, and -0.3 in FemNecks... rec to continue Calcium, MVI, Vit D. ~  labs 2/12 showed Vit D level = 19 and she is reminded to take supplement daily. ~  Labs 9/12 showed Vit D level = 21... rec to switch to 50000u supplement Rx weekly.  ANXIETY (ICD-300.00) - prev used Alprazolam 0.5mg  Prn but it is no longer on her med list. SOMATIC DYSFUNCTION (ICD-739.9) - hx mult somatic complaints...  Health Maintenance >> ~  GYN: followed by DrMarshall's office for PAP smears etc... ~  Mammograms at [facility] were OK according to the pt... ~  Immunizations:  she had Flu shot 2010 at work... ?if they gave her a Pneumonia shot... ?last Tetanus vaccine. ~  She declines the 2012 Flu vaccine here, encouraged to get it at work...   No past surgical history on file.   Outpatient Encounter Prescriptions as of 09/29/2010  Medication Sig Dispense Refill  . albuterol (PROAIR HFA) 108 (90 BASE) MCG/ACT inhaler Inhale 2 puffs into the lungs every 6 (six) hours as needed.  1 Inhaler  11  . cetirizine (ZYRTEC) 10 MG tablet Take 10 mg by mouth daily as needed.        . Cholecalciferol (VITAMIN D3) 1000 UNITS tablet 2 capsules daily       .  dexlansoprazole (DEXILANT) 60 MG capsule Take 60 mg by mouth daily.        Marland Kitchen dextromethorphan (DELSYM) 30 MG/5ML liquid Take 60 mg by mouth as needed.        . Fluticasone-Salmeterol (ADVAIR DISKUS) 100-50 MCG/DOSE AEPB Inhale 1 puff into the lungs every 12 (twelve) hours.        Marland Kitchen glimepiride (AMARYL) 4 MG tablet Take 4 mg by mouth daily before breakfast.        . Glucose Blood (ACCU-CHEK AVIVA VI) Check blood sugar daily       . metFORMIN (GLUCOPHAGE) 500 MG tablet Take 1,000 mg by mouth 2 (two) times daily with a meal.        . mometasone (NASONEX) 50 MCG/ACT nasal spray 2 sprays by Nasal route at bedtime as needed.        . Multiple Vitamins-Minerals (MULTIVITAMIN WITH MINERALS) tablet Take 1 tablet by mouth daily.        . sitaGLIPtan (JANUVIA) 100 MG tablet Take 100 mg by mouth daily. At dinner       . DISCONTD: albuterol (PROAIR HFA) 108 (90 BASE) MCG/ACT inhaler Inhale 2 puffs into the lungs every 6 (six) hours as needed.        . Vitamin D, Ergocalciferol, (DRISDOL) 50000 UNITS CAPS Take 1 capsule (50,000 Units total) by mouth every 7 (seven) days.  4 capsule  11    Allergies  Allergen Reactions  . Penicillins     REACTION: rash and sob    Current Medications, Allergies, Past Medical History, Past Surgical History, Family History, and Social History were reviewed in [employer] record.    Review of Systems        See HPI - all other systems neg except as  noted... The patient denies anorexia, fever, weight loss, weight gain, vision loss, decreased hearing, hoarseness, chest pain, syncope, dyspnea on exertion, peripheral edema, prolonged cough, headaches, hemoptysis, abdominal pain, melena, hematochezia, severe indigestion/heartburn, hematuria, incontinence, muscle weakness, suspicious skin lesions, transient blindness, difficulty walking, depression, unusual weight change, abnormal bleeding, enlarged lymph nodes, and angioedema.     Objective:   Physical  Exam    WD, WN, 55 y/o BF in NAD...   GENERAL:  Alert & oriented; pleasant & cooperative... HEENT:  [place]/AT, EOM-wnl,  EACs-clear, TMs-wnl, NOSE-clear, THROAT-clear & wnl. NECK:  Supple w/ full ROM; no JVD; normal carotid impulses w/o bruits; no thyromegaly or nodules palpated; no lymphadenopathy. CHEST:  Clear to P & A; without wheezes/ rales/ or rhonchi heard... HEART:  Regular Rhythm; without murmurs/ rubs/ or gallops. ABDOMEN:  Soft & nontender; normal bowel sounds; no organomegaly or masses detected, neg CVA tenderness.  EXT: without deformities, mild arthritic changes; no varicose veins/ venous insuffic/ or edema. NEURO:  CN's intact; no focal neuro deficits... DERM:  No lesions noted; no rash etc...   Assessment & Plan:    AR & Asthma> on Advair100 but only using this Prn & encouraged to use it twice daily every day (plus Proair HFA as needed); she uses Zyrtek & Nasonex for her nasal allergies...     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     CHOL> she has been recommended to take statin therapy but she declines preferring diet alone despite her poor numbers...     DM> on Metformin500-2Bid, Glimep4mg /d, Januvia100mg /d; plus DM diet efforts; today BS=129, A1c=7.7, and she wants to avoid insulin therefore refer to Endocrine/DM; also wants [facility] consult.     GERD> on Dexilant60mg /d & followed by DrStark (Omep doesn't work for her)...     Divertics, Polyps> she had 4mm adenomatous polyp removed 11/07 & 24yr f/u colon due soon, we will send memo to GI/ DrStark...     Elev LFTs> mild incr LFTs, likely FLD (per sonar 2009), she weighs 162# (no change) & we discussed diet, exercise, get wt down...     Vit D Defic> on VitD 1000u OTC daily supplement; but VitD level has only improved to 21 & we will switch to VitD 50K weekly...     Anxiety> she has mult somatic complaints, prev used alprazolam as needed but it is no longer on her  list..Marland Kitchen

## 2010-10-12 LAB — GLUCOSE, CAPILLARY
Glucose-Capillary: 147 — ABNORMAL HIGH
Glucose-Capillary: 183 — ABNORMAL HIGH

## 2010-10-13 ENCOUNTER — Ambulatory Visit (INDEPENDENT_AMBULATORY_CARE_PROVIDER_SITE_OTHER): Payer: BC Managed Care – PPO | Admitting: Endocrinology

## 2010-10-13 ENCOUNTER — Encounter: Payer: BC Managed Care – PPO | Attending: Pulmonary Disease | Admitting: *Deleted

## 2010-10-13 ENCOUNTER — Encounter: Payer: Self-pay | Admitting: Endocrinology

## 2010-10-13 ENCOUNTER — Encounter: Payer: Self-pay | Admitting: *Deleted

## 2010-10-13 DIAGNOSIS — E119 Type 2 diabetes mellitus without complications: Secondary | ICD-10-CM

## 2010-10-13 DIAGNOSIS — Z713 Dietary counseling and surveillance: Secondary | ICD-10-CM | POA: Insufficient documentation

## 2010-10-13 MED ORDER — GLIMEPIRIDE 2 MG PO TABS: 2.0000 mg | ORAL_TABLET | Freq: Every day | ORAL | Status: DC

## 2010-10-13 MED ORDER — PIOGLITAZONE HCL 45 MG PO TABS: 45.0000 mg | ORAL_TABLET | Freq: Every day | ORAL | Status: DC

## 2010-10-13 MED ORDER — GLUCOSE BLOOD VI STRP: 1.0000 | ORAL_STRIP | Freq: Every day | Status: DC

## 2010-10-13 NOTE — Patient Instructions (Addendum)
good diet and exercise habits significanly improve the control of your diabetes.  please let me know if you wish to be referred to a dietician, or for weight-loss surgery.  high blood sugar is very risky to your health.  you should see an eye doctor every year. controlling your blood pressure and cholesterol drastically reduces the damage diabetes does to your body.  this also applies to quitting smoking.  please discuss these with your doctor.  you should take an aspirin every day, unless you have been advised by a doctor not to. we will need to take this complex situation in stages. check your blood sugar 1 time a day.  vary the time of day when you check, between before the 3 meals, and at bedtime.  also check if you have symptoms of your blood sugar being too high or too low.  please keep a record of the readings and bring it to your next appointment here.  please call us sooner if you are having low blood sugar episodes, or if it stays over 200. For now, decrease glimepiride to 2 mg every morning.  i have sent a prescription to your pharmacy. i have sent a prescription to your pharmacy, for trips.  Here is a new meter.   Add actos 45 mg daily. Other options to improve the blood sugar are changing the januvia to "victoza," and adding bromocriptine.   Please come back for a follow-up appointment for 1 month.  Please make an appointment.

## 2010-10-13 NOTE — Progress Notes (Signed)
Subjective:    Patient ID: Lindsay Henderson, female    DOB: Jan 20, 1955, 55 y.o.   MRN: [id number]  HPI pt states 11 years h/o dm.  she is unaware of any chronic complications.  she has never been on insulin.  she takes 3 oral agents.  pt says her diet and exercise are "good."  She says cbg's are sometimes mildly low in the morning.   She has many years of intermittent mild congestion in the nose, and slight assoc wheezing. Past Medical History  Diagnosis Date  . Allergic rhinitis   . Asthma   . Anomalous atrioventricular excitation   . History of supraventricular tachycardia   . Hypercholesteremia   . Type II or unspecified type diabetes mellitus without mention of complication, not stated as uncontrolled   . GERD (gastroesophageal reflux disease)   . Gastritis   . Diverticulosis of colon   . Personal history of colonic polyps   . Nephrolithiasis   . Renal cyst   . Vitamin D deficiency   . Anxiety   . Somatic dysfunction     No past surgical history on file.  History   Social History  . Marital Status: Married    Spouse Name: N/A    Number of Children: 4  . Years of Education: N/A   Occupational History  . [employer]    Social History Main Topics  . Smoking status: Never Smoker   . Smokeless tobacco: Not on file  . Alcohol Use: No  . Drug Use: No  . Sexually Active: Not on file   Other Topics Concern  . Not on file   Social History Narrative  . No narrative on file    Current Outpatient Prescriptions on File Prior to Visit  Medication Sig Dispense Refill  . albuterol (PROAIR HFA) 108 (90 BASE) MCG/ACT inhaler Inhale 2 puffs into the lungs every 6 (six) hours as needed.  1 Inhaler  11  . cetirizine (ZYRTEC) 10 MG tablet Take 10 mg by mouth daily as needed.        . Cholecalciferol (VITAMIN D3) 1000 UNITS tablet 2 capsules daily       . dexlansoprazole (DEXILANT) 60 MG capsule Take 60 mg by mouth daily.        Marland Kitchen dextromethorphan (DELSYM) 30 MG/5ML  liquid Take 60 mg by mouth as needed.        . Fluticasone-Salmeterol (ADVAIR DISKUS) 100-50 MCG/DOSE AEPB Inhale 1 puff into the lungs every 12 (twelve) hours.        Marland Kitchen glimepiride (AMARYL) 4 MG tablet Take 4 mg by mouth daily before breakfast.        . Glucose Blood (ACCU-CHEK AVIVA VI) Check blood sugar daily       . metFORMIN (GLUCOPHAGE) 500 MG tablet Take 1,000 mg by mouth 2 (two) times daily with a meal.        . mometasone (NASONEX) 50 MCG/ACT nasal spray 2 sprays by Nasal route at bedtime as needed.        . Multiple Vitamins-Minerals (MULTIVITAMIN WITH MINERALS) tablet Take 1 tablet by mouth daily.        . sitaGLIPtan (JANUVIA) 100 MG tablet Take 100 mg by mouth daily. At dinner       . Vitamin D, Ergocalciferol, (DRISDOL) 50000 UNITS CAPS Take 1 capsule (50,000 Units total) by mouth every 7 (seven) days.  4 capsule  11    Allergies  Allergen Reactions  . Penicillins  REACTION: rash and sob    Family History  Problem Relation Age of Onset  . Diabetes Father   . Breast cancer Mother     BP 112/72  Pulse 92  Temp(Src) 98.2 F (36.8 C) (Oral)  Ht 5\' 3"  (1.6 m)  Wt 162 lb 9.6 oz (73.755 kg)  BMI 28.80 kg/m2  SpO2 96%  Review of Systems denies weight loss, blurry vision, headache, chest pain, sob, n/v, urinary frequency, numbness cramps, excessive diaphoresis, memory loss, depression, hypoglycemia, and easy bruising.  She has intermittent rhinorrhea.      Objective:   Physical Exam VS: see vs page GEN: no distress.  Obese. HEAD: head: no deformity eyes: no periorbital swelling, no proptosis external nose and ears are normal mouth: no lesion seen NECK: supple, thyroid is not enlarged CHEST WALL: no deformity LUNGS:  Clear to auscultation CV: reg rate and rhythm, no murmur ABD: abdomen is soft, nontender.  no hepatosplenomegaly.  not distended.  no hernia MUSCULOSKELETAL: muscle bulk and strength are grossly normal.  no obvious joint swelling.  gait is normal  and steady EXTEMITIES: no deformity.  no ulcer on the feet.  feet are of normal color and temp.  no edema PULSES: dorsalis pedis intact bilat.  no carotid bruit NEURO:  cn 2-12 grossly intact.   readily moves all 4's.  sensation is intact to touch on the feet SKIN:  Normal texture and temperature.  No rash or suspicious lesion is visible.   NODES:  None palpable at the neck PSYCH: alert, oriented x3.  Does not appear anxious nor depressed.   Lab Results  Component Value Date   HGBA1C 7.7* 09/27/2010   Lab Results  Component Value Date   CHOL 216* 02/17/2010   HDL 46.00 02/17/2010   LDLCALC 116* 06/10/2008   LDLDIRECT 163.5 02/17/2010   TRIG 116.0 02/17/2010   CHOLHDL 5 02/17/2010       Assessment & Plan:  Type 2 DM.  She needs increased rx, if it can be done with a regimen that avoids or minimizes hypoglycemia. H/o svt.  This would make hypoglycemia more risky. Dyslipidemia.  This can exac the complications of dm

## 2010-10-13 NOTE — Progress Notes (Signed)
  Medical Nutrition Therapy:  Appt start time: 1000 end time:  1100.   Assessment:  Primary concerns today: Diabetes Self- Management Counseling.   MEDICATIONS: see list. Diabetes Meds include Amaryl, dose reduced this AM by endocrinologist to 2 mg at breakfast due to hypoglycemia mid morning, Metformin 500 mg, 2 tabs BID, Januvia 100mg  at dinner, and added today; Actos 45 mg daily which patient states she is hesitant to take due to commercials about cancer.   DIETARY INTAKE:  Usual eating pattern includes 3 meals and 2 snacks per day.  Everyday foods include good variety of all food groups.  Avoided foods include sweets usually.    24-hr recall:  B ( AM): large bowl of oatmeal with milk, sugar and butter added, or 2 eggs, 3 bacon, 2 toast and grits  Snk ( AM): whole banana to prevent hypglycemia  L ( PM): lean meat sandwich with chips or 1/2 large apple Snk ( PM): rare, only if low BG D ( PM): meat, starch and veg meal with salad Snk ( PM): sweetened cereal to prevent hypoglycemia during the night Beverages: water, 2% milk, occasional fruit juice  Usual physical activity: has avoided treadmill due to hypoglycemia, plans to resume starting with 15 minutes/day and increasing as able to 45 minutes/day  Estimated energy needs: 1400 calories 158 g carbohydrates 105 g protein 39 g fat  Progress Towards Goal(s):  In progress.   Nutritional Diagnosis:  NI-5.5 Imbalance of nutrients As related to variablilty of carb intake.  As evidenced by hypo and hyper glycemia.    Intervention:  Nutrition meal plan with 2-3 Carb Choices per meal and 0-1 Carb choice for snacks as needed to prevent hypoglycemia..  Handouts given during visit include:  Carb Counting handout  Fast Food Guide  Monitoring/Evaluation:  Dietary intake, exercise, consistent carb intake, and body weight prn.

## 2010-11-17 ENCOUNTER — Ambulatory Visit: Payer: BC Managed Care – PPO | Admitting: Endocrinology

## 2010-11-24 ENCOUNTER — Ambulatory Visit (INDEPENDENT_AMBULATORY_CARE_PROVIDER_SITE_OTHER): Payer: BC Managed Care – PPO | Admitting: Endocrinology

## 2010-11-24 ENCOUNTER — Encounter: Payer: Self-pay | Admitting: Endocrinology

## 2010-11-24 DIAGNOSIS — E119 Type 2 diabetes mellitus without complications: Secondary | ICD-10-CM

## 2010-11-24 NOTE — Patient Instructions (Addendum)
check your blood sugar 1 time a day.  vary the time of day when you check, between before the 3 meals, and at bedtime.  also check if you have symptoms of your blood sugar being too high or too low.  please keep a record of the readings and bring it to your next appointment here.  please call us sooner if you are having low blood sugar episodes, or if it stays over 200. Please start actos 45 mg daily. Continue the same glimepiride for now.  However, as the actos starts working, you may notice the blood sugar coming down.  If this happens, decrease the glimepiride to 1/2 of 2 mg every morning.  If it is still below 100, stop the glimepiride altogether.  Call us you have any question about this.   Other options to improve the blood sugar are changing the januvia to "victoza," and adding bromocriptine.   Please come back for a follow-up appointment for 1 month.  Please make an appointment.

## 2010-11-24 NOTE — Progress Notes (Signed)
Subjective:    Patient ID: Lindsay Henderson, female    DOB: May 05, 1955, 55 y.o.   MRN: [id number]  HPI Pt returns for f/u of type 2 DM.  She has not started the actos.  no cbg record, but states cbg's are in the low to mid-100's.   Past Medical History  Diagnosis Date  . Allergic rhinitis   . Asthma   . Anomalous atrioventricular excitation   . History of supraventricular tachycardia   . Hypercholesteremia   . Type II or unspecified type diabetes mellitus without mention of complication, not stated as uncontrolled   . GERD (gastroesophageal reflux disease)   . Gastritis   . Diverticulosis of colon   . Personal history of colonic polyps   . Nephrolithiasis   . Renal cyst   . Vitamin D deficiency   . Anxiety   . Somatic dysfunction     No past surgical history on file.  History   Social History  . Marital Status: Married    Spouse Name: N/A    Number of Children: 4  . Years of Education: N/A   Occupational History  . [employer]    Social History Main Topics  . Smoking status: Never Smoker   . Smokeless tobacco: Not on file  . Alcohol Use: No  . Drug Use: No  . Sexually Active: Not on file   Other Topics Concern  . Not on file   Social History Narrative  . No narrative on file    Current Outpatient Prescriptions on File Prior to Visit  Medication Sig Dispense Refill  . albuterol (PROAIR HFA) 108 (90 BASE) MCG/ACT inhaler Inhale 2 puffs into the lungs every 6 (six) hours as needed.  1 Inhaler  11  . cetirizine (ZYRTEC) 10 MG tablet Take 10 mg by mouth daily as needed.        . Cholecalciferol (VITAMIN D3) 1000 UNITS tablet 2 capsules daily       . dexlansoprazole (DEXILANT) 60 MG capsule Take 60 mg by mouth daily.        Marland Kitchen dextromethorphan (DELSYM) 30 MG/5ML liquid Take 60 mg by mouth as needed.        . Fluticasone-Salmeterol (ADVAIR DISKUS) 100-50 MCG/DOSE AEPB Inhale 1 puff into the lungs every 12 (twelve) hours.        Marland Kitchen glimepiride (AMARYL) 2 MG  tablet Take 1 tablet (2 mg total) by mouth daily before breakfast.  30 tablet  11  . Glucose Blood (ACCU-CHEK AVIVA VI) Check blood sugar daily       . glucose blood (ONE TOUCH ULTRA TEST) test strip 1 each by Other route daily. And lancets 250.00  100 each  12  . metFORMIN (GLUCOPHAGE) 500 MG tablet Take 1,000 mg by mouth 2 (two) times daily with a meal.        . mometasone (NASONEX) 50 MCG/ACT nasal spray 2 sprays by Nasal route at bedtime as needed.        . Multiple Vitamins-Minerals (MULTIVITAMIN WITH MINERALS) tablet Take 1 tablet by mouth daily.        . pioglitazone (ACTOS) 45 MG tablet Take 1 tablet (45 mg total) by mouth daily.  30 tablet  11  . sitaGLIPtan (JANUVIA) 100 MG tablet Take 100 mg by mouth daily. At dinner       . Vitamin D, Ergocalciferol, (DRISDOL) 50000 UNITS CAPS Take 1 capsule (50,000 Units total) by mouth every 7 (seven) days.  4 capsule  11  Allergies  Allergen Reactions  . Penicillins     REACTION: rash and sob    Family History  Problem Relation Age of Onset  . Diabetes Father   . Breast cancer Mother     BP 124/86  Pulse 78  Temp(Src) 97.4 F (36.3 C) (Oral)  Ht 5\' 3"  (1.6 m)  Wt 164 lb 8 oz (74.617 kg)  BMI 29.14 kg/m2  SpO2 95%  Review of Systems denies hypoglycemia.      Objective:   Physical Exam VITAL SIGNS:  See vs page GENERAL: no distress Ext: no edema     Assessment & Plan:  DM.  She needs to make the transition off the amaryl

## 2010-12-23 ENCOUNTER — Encounter: Payer: Self-pay | Admitting: Endocrinology

## 2010-12-23 ENCOUNTER — Ambulatory Visit (INDEPENDENT_AMBULATORY_CARE_PROVIDER_SITE_OTHER): Payer: BC Managed Care – PPO | Admitting: Endocrinology

## 2010-12-23 DIAGNOSIS — E119 Type 2 diabetes mellitus without complications: Secondary | ICD-10-CM

## 2010-12-23 MED ORDER — GLIMEPIRIDE 1 MG PO TABS: 1.0000 mg | ORAL_TABLET | Freq: Every day | ORAL | Status: DC

## 2010-12-23 NOTE — Progress Notes (Signed)
Subjective:    Patient ID: Lindsay Henderson, female    DOB: 1955-01-28, 55 y.o.   MRN: [id number]  HPI Pt returns for f/u of type 2 DM.  no cbg record, but states cbg's are in the mid-100's.  pt states she feels well in general.  She takes 1/2 of amaryl 2 mg, qd.   Past Medical History  Diagnosis Date  . Allergic rhinitis   . Asthma   . Anomalous atrioventricular excitation   . History of supraventricular tachycardia   . Hypercholesteremia   . Type II or unspecified type diabetes mellitus without mention of complication, not stated as uncontrolled   . GERD (gastroesophageal reflux disease)   . Gastritis   . Diverticulosis of colon   . Personal history of colonic polyps   . Nephrolithiasis   . Renal cyst   . Vitamin D deficiency   . Anxiety   . Somatic dysfunction     No past surgical history on file.  History   Social History  . Marital Status: Married    Spouse Name: N/A    Number of Children: 4  . Years of Education: N/A   Occupational History  . [employer]    Social History Main Topics  . Smoking status: Never Smoker   . Smokeless tobacco: Not on file  . Alcohol Use: No  . Drug Use: No  . Sexually Active: Not on file   Other Topics Concern  . Not on file   Social History Narrative  . No narrative on file    Current Outpatient Prescriptions on File Prior to Visit  Medication Sig Dispense Refill  . albuterol (PROAIR HFA) 108 (90 BASE) MCG/ACT inhaler Inhale 2 puffs into the lungs every 6 (six) hours as needed.  1 Inhaler  11  . cetirizine (ZYRTEC) 10 MG tablet Take 10 mg by mouth daily as needed.        . Cholecalciferol (VITAMIN D3) 1000 UNITS tablet 2 capsules daily       . dexlansoprazole (DEXILANT) 60 MG capsule Take 60 mg by mouth daily.        Marland Kitchen dextromethorphan (DELSYM) 30 MG/5ML liquid Take 60 mg by mouth as needed.        . Fluticasone-Salmeterol (ADVAIR DISKUS) 100-50 MCG/DOSE AEPB Inhale 1 puff into the lungs every 12 (twelve) hours.         . Glucose Blood (ACCU-CHEK AVIVA VI) Check blood sugar daily       . glucose blood (ONE TOUCH ULTRA TEST) test strip 1 each by Other route daily. And lancets 250.00  100 each  12  . metFORMIN (GLUCOPHAGE) 500 MG tablet Take 1,000 mg by mouth 2 (two) times daily with a meal.        . mometasone (NASONEX) 50 MCG/ACT nasal spray 2 sprays by Nasal route at bedtime as needed.        . Multiple Vitamins-Minerals (MULTIVITAMIN WITH MINERALS) tablet Take 1 tablet by mouth daily.        . pioglitazone (ACTOS) 45 MG tablet Take 1 tablet (45 mg total) by mouth daily.  30 tablet  11  . sitaGLIPtan (JANUVIA) 100 MG tablet Take 100 mg by mouth daily. At dinner       . Vitamin D, Ergocalciferol, (DRISDOL) 50000 UNITS CAPS Take 1 capsule (50,000 Units total) by mouth every 7 (seven) days.  4 capsule  11    Allergies  Allergen Reactions  . Penicillins     REACTION:  rash and sob    Family History  Problem Relation Age of Onset  . Diabetes Father   . Breast cancer Mother     BP 130/82  Pulse 92  Temp(Src) 98.8 F (37.1 C) (Oral)  Ht 5\' 3"  (1.6 m)  Wt 164 lb 12.8 oz (74.753 kg)  BMI 29.19 kg/m2  SpO2 97%   Review of Systems denies hypoglycemia.      Objective:   Physical Exam VITAL SIGNS:  See vs page GENERAL: no distress Ext: no edema      Assessment & Plan:  Type 2 DM.  She is ready to continue the transition away from [facility].

## 2010-12-23 NOTE — Patient Instructions (Addendum)
check your blood sugar 1 time a day.  vary the time of day when you check, between before the 3 meals, and at bedtime.  also check if you have symptoms of your blood sugar being too high or too low.  please keep a record of the readings and bring it to your next appointment here.  please call us sooner if you are having low blood sugar episodes, or if it stays over 200. Continue the same medications for now.  However, as the actos continues working, you may notice the blood sugar coming down.  If this happens, decrease the glimepiride to 1/2 of 1 mg every morning.  If it is still below 100, stop the glimepiride altogether.  Call us you have any question about this.   Other options to improve the blood sugar are changing the januvia to "victoza," and adding bromocriptine.   Please come back for a follow-up appointment for 2 months.  Please make an appointment.

## 2010-12-27 ENCOUNTER — Encounter: Payer: Self-pay | Admitting: Gastroenterology

## 2010-12-27 ENCOUNTER — Other Ambulatory Visit: Payer: Self-pay | Admitting: *Deleted

## 2010-12-27 MED ORDER — GLUCOSE BLOOD VI STRP: ORAL_STRIP | Status: DC

## 2010-12-27 NOTE — Telephone Encounter (Signed)
Pt states that she needs new rx for test strips because pharmacy is only giving her 25 strips a month which is not enough. Rx sent to [facility], pt informed.

## 2011-01-15 ENCOUNTER — Other Ambulatory Visit: Payer: Self-pay | Admitting: Pulmonary Disease

## 2011-01-25 ENCOUNTER — Other Ambulatory Visit: Payer: Self-pay | Admitting: Pulmonary Disease

## 2011-02-08 ENCOUNTER — Telehealth: Payer: Self-pay | Admitting: Pulmonary Disease

## 2011-02-08 MED ORDER — LEVOFLOXACIN 500 MG PO TABS: 500.0000 mg | ORAL_TABLET | Freq: Every day | ORAL | Status: AC

## 2011-02-08 MED ORDER — PREDNISONE (PAK) 5 MG PO TABS: ORAL_TABLET | ORAL | Status: DC

## 2011-02-08 NOTE — Telephone Encounter (Signed)
I spoke with pt and is aware of SN recs. She voiced her understanding and is aware of the directions. rx's has been sent and nothing further was needed

## 2011-02-08 NOTE — Telephone Encounter (Signed)
Per SN---ok to send in levaquin 500mg   #7   1 daily, pred dosepak  5 mg   6 day pack, otc mucinex 2 po bid with plenty of fluids and nasal saline as needed.  thanks

## 2011-02-08 NOTE — Telephone Encounter (Signed)
Spoke with pt. She is c/o sinus infection x 1 wk- PND, green/blood tinged nasal d/c, prod cough with yellow to green sputum, low grade fever, and HA. She scheduled ov with TP for tomorrow, but states that she prefers abx be called in so she does not have to come in. She states that the afrin and sudafed rec by her pharmacist is not helping with her symptoms. SN, please advise, thanks! Allergies  Allergen Reactions  . Penicillins     REACTION: rash and sob  ;l

## 2011-02-09 ENCOUNTER — Ambulatory Visit: Payer: BC Managed Care – PPO | Admitting: Adult Health

## 2011-02-23 ENCOUNTER — Ambulatory Visit: Payer: BC Managed Care – PPO | Admitting: Endocrinology

## 2011-03-02 ENCOUNTER — Ambulatory Visit (INDEPENDENT_AMBULATORY_CARE_PROVIDER_SITE_OTHER): Payer: 59 | Admitting: Endocrinology

## 2011-03-02 ENCOUNTER — Encounter: Payer: Self-pay | Admitting: Endocrinology

## 2011-03-02 VITALS — BP 124/78 | HR 91 | Temp 97.1°F | Ht 62.0 in | Wt 162.0 lb

## 2011-03-02 DIAGNOSIS — E119 Type 2 diabetes mellitus without complications: Secondary | ICD-10-CM

## 2011-03-02 MED ORDER — AZITHROMYCIN 500 MG PO TABS: 500.0000 mg | ORAL_TABLET | Freq: Every day | ORAL | Status: AC

## 2011-03-02 NOTE — Progress Notes (Signed)
Subjective:    Patient ID: Lindsay Henderson, female    DOB: 1955-12-03, 56 y.o.   MRN: [id number]  HPI Pt returns for f/u of type 2 DM (2000).  She stopped glimepiride a few weeks ago.  no cbg record, but states cbg's were well-controlled off it until she started prednisone.  Then it increased to over 200.  She finished prednisone last week.   She also has 1 week of slight pain at the left ear, and assoc nasal congestion. Past Medical History  Diagnosis Date  . Allergic rhinitis   . Asthma   . Anomalous atrioventricular excitation   . History of supraventricular tachycardia   . Hypercholesteremia   . Type II or unspecified type diabetes mellitus without mention of complication, not stated as uncontrolled   . GERD (gastroesophageal reflux disease)   . Gastritis   . Diverticulosis of colon   . Personal history of colonic polyps   . Nephrolithiasis   . Renal cyst   . Vitamin d deficiency   . Anxiety   . Somatic dysfunction     No past surgical history on file.  History   Social History  . Marital Status: Married    Spouse Name: N/A    Number of Children: 4  . Years of Education: N/A   Occupational History  . [employer]    Social History Main Topics  . Smoking status: Never Smoker   . Smokeless tobacco: Not on file  . Alcohol Use: No  . Drug Use: No  . Sexually Active: Not on file   Other Topics Concern  . Not on file   Social History Narrative  . No narrative on file    Current Outpatient Prescriptions on File Prior to Visit  Medication Sig Dispense Refill  . albuterol (PROAIR HFA) 108 (90 BASE) MCG/ACT inhaler Inhale 2 puffs into the lungs every 6 (six) hours as needed.  1 Inhaler  11  . cetirizine (ZYRTEC) 10 MG tablet Take 10 mg by mouth daily as needed.        . Cholecalciferol (VITAMIN D3) 1000 UNITS tablet 2 capsules daily       . DEXILANT 60 MG capsule TAKE 1 CAPSULE DAILY  90 capsule  3  . dextromethorphan (DELSYM) 30 MG/5ML liquid Take 60 mg by  mouth as needed.        . Fluticasone-Salmeterol (ADVAIR DISKUS) 100-50 MCG/DOSE AEPB Inhale 1 puff into the lungs every 12 (twelve) hours.        . Glucose Blood (ACCU-CHEK AVIVA VI) Check blood sugar daily       . glucose blood (ONE TOUCH ULTRA TEST) test strip Use as directed to check blood sugar 250.00  100 each  10  . mometasone (NASONEX) 50 MCG/ACT nasal spray 2 sprays by Nasal route at bedtime as needed.        . Multiple Vitamins-Minerals (MULTIVITAMIN WITH MINERALS) tablet Take 1 tablet by mouth daily.        . pioglitazone (ACTOS) 45 MG tablet Take 1 tablet (45 mg total) by mouth daily.  30 tablet  11  . predniSONE (STERAPRED UNI-PAK) 5 MG TABS 6 day pack take as directed  21 tablet  0  . sitaGLIPtan (JANUVIA) 100 MG tablet Take 100 mg by mouth daily. At dinner       . Vitamin D, Ergocalciferol, (DRISDOL) 50000 UNITS CAPS Take 1 capsule (50,000 Units total) by mouth every 7 (seven) days.  4 capsule  11  Allergies  Allergen Reactions  . Penicillins     REACTION: rash and sob    Family History  Problem Relation Age of Onset  . Diabetes Father   . Breast cancer Mother     BP 124/78  Pulse 91  Temp(Src) 97.1 F (36.2 C) (Oral)  Ht 5\' 2"  (1.575 m)  Wt 162 lb (73.483 kg)  BMI 29.63 kg/m2  SpO2 97%   Review of Systems cbg 2 days ago was 68 (on actos and metformin only).  Denies fever    Objective:   Physical Exam VITAL SIGNS:  See vs page GENERAL: no distress Left tm is red (right is normal) Ext: no edema   Lab Results  Component Value Date   HGBA1C 7.7* 09/27/2010      Assessment & Plan:  URI, new DM, good control considering the complexity of steroid rx

## 2011-03-02 NOTE — Patient Instructions (Addendum)
check your blood sugar 1 time a day.  vary the time of day when you check, between before the 3 meals, and at bedtime.  also check if you have symptoms of your blood sugar being too high or too low.  please keep a record of the readings and bring it to your next appointment here.  please call us sooner if you are having low blood sugar episodes, or if it stays over 200. i have sent a prescription to your pharmacy, for a different antibiotic. Loratadine-d (non-prescription) will help your congestion. Please see dr Kriste Basque in 1-2 weeks.   Please come back for a follow-up appointment in 3 months. (update: i left message on phone-tree:  rx as we discussed)

## 2011-03-03 MED ORDER — METFORMIN HCL 500 MG PO TABS: ORAL_TABLET | ORAL | Status: DC

## 2011-03-23 ENCOUNTER — Telehealth: Payer: Self-pay | Admitting: Pulmonary Disease

## 2011-03-23 DIAGNOSIS — E119 Type 2 diabetes mellitus without complications: Secondary | ICD-10-CM

## 2011-03-23 DIAGNOSIS — K5730 Diverticulosis of large intestine without perforation or abscess without bleeding: Secondary | ICD-10-CM

## 2011-03-23 DIAGNOSIS — E7800 Pure hypercholesterolemia, unspecified: Secondary | ICD-10-CM

## 2011-03-23 DIAGNOSIS — E559 Vitamin D deficiency, unspecified: Secondary | ICD-10-CM

## 2011-03-23 DIAGNOSIS — F411 Generalized anxiety disorder: Secondary | ICD-10-CM

## 2011-03-23 NOTE — Telephone Encounter (Signed)
lmomtcb x1 for pt 

## 2011-03-23 NOTE — Telephone Encounter (Signed)
Pt called back and she is aware of labs in the computer for her and she will come in the morning for these.

## 2011-03-24 ENCOUNTER — Other Ambulatory Visit (INDEPENDENT_AMBULATORY_CARE_PROVIDER_SITE_OTHER): Payer: 59

## 2011-03-24 DIAGNOSIS — K5730 Diverticulosis of large intestine without perforation or abscess without bleeding: Secondary | ICD-10-CM

## 2011-03-24 DIAGNOSIS — E7800 Pure hypercholesterolemia, unspecified: Secondary | ICD-10-CM

## 2011-03-24 DIAGNOSIS — E559 Vitamin D deficiency, unspecified: Secondary | ICD-10-CM

## 2011-03-24 DIAGNOSIS — F411 Generalized anxiety disorder: Secondary | ICD-10-CM

## 2011-03-24 DIAGNOSIS — E119 Type 2 diabetes mellitus without complications: Secondary | ICD-10-CM

## 2011-03-24 LAB — URINALYSIS
Bilirubin Urine: NEGATIVE
Ketones, ur: NEGATIVE
Leukocytes, UA: NEGATIVE
Total Protein, Urine: NEGATIVE
pH: 6 (ref 5.0–8.0)

## 2011-03-24 LAB — HEPATIC FUNCTION PANEL
ALT: 34 U/L (ref 0–35)
AST: 25 U/L (ref 0–37)
Total Bilirubin: 0.2 mg/dL — ABNORMAL LOW (ref 0.3–1.2)

## 2011-03-24 LAB — CBC WITH DIFFERENTIAL/PLATELET
Basophils Relative: 0.7 % (ref 0.0–3.0)
Eosinophils Relative: 4.5 % (ref 0.0–5.0)
HCT: 40.2 % (ref 36.0–46.0)
Hemoglobin: 13.2 g/dL (ref 12.0–15.0)
Lymphs Abs: 2 10*3/uL (ref 0.7–4.0)
MCV: 90 fl (ref 78.0–100.0)
Monocytes Absolute: 0.5 10*3/uL (ref 0.1–1.0)
Neutro Abs: 4.8 10*3/uL (ref 1.4–7.7)
RBC: 4.46 Mil/uL (ref 3.87–5.11)
WBC: 7.7 10*3/uL (ref 4.5–10.5)

## 2011-03-24 LAB — TSH: TSH: 1.27 u[IU]/mL (ref 0.35–5.50)

## 2011-03-24 LAB — BASIC METABOLIC PANEL
CO2: 28 mEq/L (ref 19–32)
Calcium: 9.2 mg/dL (ref 8.4–10.5)
Creatinine, Ser: 0.8 mg/dL (ref 0.4–1.2)

## 2011-03-24 LAB — LIPID PANEL: Triglycerides: 80 mg/dL (ref 0.0–149.0)

## 2011-03-25 ENCOUNTER — Encounter: Payer: Self-pay | Admitting: Endocrinology

## 2011-03-29 ENCOUNTER — Ambulatory Visit (INDEPENDENT_AMBULATORY_CARE_PROVIDER_SITE_OTHER): Payer: 59 | Admitting: Pulmonary Disease

## 2011-03-29 ENCOUNTER — Ambulatory Visit: Payer: BC Managed Care – PPO | Admitting: Adult Health

## 2011-03-29 ENCOUNTER — Encounter: Payer: Self-pay | Admitting: Pulmonary Disease

## 2011-03-29 VITALS — BP 144/84 | HR 88 | Temp 97.5°F | Ht 62.0 in | Wt 168.8 lb

## 2011-03-29 DIAGNOSIS — E559 Vitamin D deficiency, unspecified: Secondary | ICD-10-CM

## 2011-03-29 DIAGNOSIS — J309 Allergic rhinitis, unspecified: Secondary | ICD-10-CM

## 2011-03-29 DIAGNOSIS — K5730 Diverticulosis of large intestine without perforation or abscess without bleeding: Secondary | ICD-10-CM

## 2011-03-29 DIAGNOSIS — Z860100 Personal history of colon polyps, unspecified: Secondary | ICD-10-CM

## 2011-03-29 DIAGNOSIS — J45909 Unspecified asthma, uncomplicated: Secondary | ICD-10-CM

## 2011-03-29 DIAGNOSIS — K219 Gastro-esophageal reflux disease without esophagitis: Secondary | ICD-10-CM

## 2011-03-29 DIAGNOSIS — I498 Other specified cardiac arrhythmias: Secondary | ICD-10-CM

## 2011-03-29 DIAGNOSIS — E119 Type 2 diabetes mellitus without complications: Secondary | ICD-10-CM

## 2011-03-29 DIAGNOSIS — E7800 Pure hypercholesterolemia, unspecified: Secondary | ICD-10-CM

## 2011-03-29 DIAGNOSIS — F411 Generalized anxiety disorder: Secondary | ICD-10-CM

## 2011-03-29 MED ORDER — ROSUVASTATIN CALCIUM 10 MG PO TABS: 10.0000 mg | ORAL_TABLET | Freq: Every day | ORAL | Status: DC

## 2011-03-29 NOTE — Progress Notes (Addendum)
Subjective:    Patient ID: Lindsay Henderson, female    DOB: 1955-12-11, 56 y.o.   MRN: [id number]  HPI 56 y/o BF here for a follow up visit... she has multiple medical problems including hx Asthma, hx WPW, Chol, DM, GERD/ Divertics/ Polyps, Vit D defic, Anxiety/ Somatization, & hx poor compliance w/ med rx...  ~  February 21, 2010:    She denies breathing problems or Asthma attacks;  using her inhalers just as needed she says & not inclined to incr to regular preventive dosing...    She tells me that she has once again stopped the Lipitor & FLP is similar to prev w/ TChol 216, LDL 164, & she is content w/ these numbers & refuses to take Statin rx regularly- I offered her any med she wanted (cheaper copay than the Lipitor), vs Lipid Clinic but she declines;  she will work on diet & try to get LDL<130, we discussed low chol, low fat...    She notes BS at home all <145 range;  Labs here show FBS=127, A1c=7.5 (it was 7.7 last time), and she is pleased w/ the improvement on Metform500- 2Bid, Glimep4mg /d, & JANUVIA 100mg /d;  we discussed diet + exercise, & she is considering weight watchers.    Note Vit D level low at 19 and she is supposed to be on a Women's Formula MVI + Vit D OTC supplement of 2000 u daily;  BMD 6/11 here was WNL w/ TScores -0.3 to -0.5;  she is encouraged to take the Vit D daily.  ~  September 29, 2010:  19mo ROV & she requests refills for all meds 90d supplies; she declines to receive the 2012 Flu vaccine...    AR & Asthma> on Advair100 but only using this Prn she says (plus Proair HFA as needed); she uses Zyrtek & Nasonex for her nasal allergies; denies breathing problems or asthma exac...    Hx WPW & SVT> followed by DrTaylor w/ hx catheter ablation in 2004; she denies palpit, dizzy, syncope, etc...    CHOL> she has been recommended to take statin therapy but she declines preferring diet alone despite her poor numbers...    DM> on Metformin500-2Bid, Glimep4mg /d, Januvia100mg /d;  plus DM diet efforts; today BS=129, A1c=7.7, and she wants to avoid insulin therefore refer to Endocrine/DM; also wants [facility] consult.    GERD> on Dexilant60mg /d & followed by DrStark (Omep doesn't work for her)...    Divertics, Polyps> she had 4mm adenomatous polyp removed 11/07 & 67yr f/u colon due soon, we will send memo to GI/ DrStark...    Elev LFTs> mild incr LFTs, likely FLD (per sonar 2009), she weighs 162# (no change) & we discussed diet, exercise, get wt down...    Vit D Defic> on VitD 1000u OTC daily supplement; but VitD level has only improved to 21 & we will switch to VitD 50K weekly...    Anxiety> she has mult somatic complaints, prev used alprazolam as needed but it is no longer on her list...  ~  March 29, 2011:  61mo ROV & she has seen DrEllison monthly for DM control & has been to the [facility] center;  His notes indicate that she has been inconsistent w/ meds- eg. ran out of Glimep in Feb & A1c is up to 7.9; she indicates that he wanted to change Januvia to Victoza...  She reports that her breathing is stable on Advair/ Proventil... See prob list below>> LABS 3/13:  FLP- no better, not  at goals on diet alone;  Chems- ok x BS=131, A1c=7.9;  CBC- wnl;  TSH=1.27;  VitD=23;  UA=clear          Problem List:     << PROBLEM LIST UPDATED 03/29/11 >>  ALLERGIC RHINITIS (ICD-477.9) - controlled on Zyrtek, Nasonex, & Saline...  Note: ENT eval in past was neg.  ASTHMA (ICD-493.90) - controlled on ADVAIR 100 Bid (but only using it Prn now), & PROAIR Prn... uses Mucinex Prn as well. ~  She denies any recent asthma exac...  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 ~  Myoview 7/06 was normal w/o ischemia, norm wall motion, EF=93%...  ~  2DEcho 7/06 w/ norm LVF... ~  EKG 6/10 shows NSR, rate 90, NAD... ~  She has intermittent mild palpit, none recently...    HYPERCHOLESTEROLEMIA (ICD-272.0) - prev on Lipitor20 & she tolerated it well, but everytime she starts it- she stops it again w/o explanation (once because her husb had a reaction to it); she declines other statins, declines Lipid Clinic referral, wants to do diet alone. ~  FLP 9/08 on Lip20 showed TChol 140, TG 89, HDL 37, LDL 85 ~  FLP 6/09 on Lip20 showed TChol 138, TG 55, HDL 35, LDL 92... rec incr exercise, same med. ~  FLP 6/10 ?on Lip20 showed TChol 181, TG 121, HDL 41, LDL 116... rec> take meds regularly! ~  FLP 5/11 off med showed TChol 220, TG 143, HDL 47, LDL 165... rec> restart Lip20 every day! ~  FLP 2/12 on diet alone showed TChol 216, TG 116, HDL 46, LDL 164... she refuses meds, & will do the best she can on diet alone. ~  FLP 3/13 on diet alone showed TChol 215, TG 80, HDL 58, LDL 150... She agrees to try CRESTOR10mg /d.  DIABETES MELLITUS, TYPE II (ICD-250.00) - now  on METFORMIN 500mg - 2Bid, ACTOS 45mg /d, & JANUVIA 100mg /d per DrEllison ~  labs 9/08 on GlucovQam showed BS= 122, HgA1c= 7.4... she was told to incr Glucovance Bid- but didn't do it! ~  labs 6/09 showed BS= 166, HgA1c= 8.2.Marland KitchenMarland Kitchen rec to incr the Glucovance to Bid!!! ~  labs 6/10 showed BS= 161, A1c= 8.1, Microalb= neg... decided to change Rx to Metformin 1000Bid, Glimepiride 4mg /d, Januvia 100mg /d. ~  10/10: pt reports that she's not taking the Januvia due to $$, but can fill it now since deduct is met! ~  labs 5/11 ?on 3 meds? showed BS= 95, A1c= 7.7.Marland KitchenMarland Kitchen rec to get on diet, take meds regularly. ~  labs 2/12 on 3 meds showed BS= 127, A1c= 7.5.Marland KitchenMarland Kitchen she is happy w/ this result & doesn't want more med. ~  She saw DrDolan for optometry 3/12 & no retinopathy seen... ~  Labs 9/12 showed BS= 129, A1c= 7.7, and we will refer pt to Endocrine/DM in light of her worsening A1c on 3 max oral meds (also refer to [facility] per request). ~  DM now followed by DrEllison monthly w/ med adjustments==> his med lists reviewed but appeared  incomplete (ie Metform & Glimep not listed at their last OV)... ~  Labs 3/13 on Metform2Bid+Actos45+Januv100 showed BS=131, A1c=7.9.Marland KitchenMarland Kitchen rec to restart the Glimep she ran out of & f/u w/ DrEllison.  GERD (ICD-530.81) - on DEXILANT 60mg /d... Omep didn't work for her. ~  GI eval 9/09 by DrStark- EGD showed gastritis, GERD;   ~  AbdSonar 9/09 showed fatty  liver, cyst in left hep lobe, bilat renal cysts & stones.  ~  Hx mild elev LFTs likely due to fatty liver disease... ~  Labs 3/13 showed normal LFTs w/ SGOT=25, SGPT=34  DIVERTICULOSIS OF COLON (ICD-562.10) COLONIC POLYPS (ICD-211.3) ~  last colonoscopy was 11/07 showing divertics and 4mm polyp (adenomatous)... f/u planned 57yrs. ~  9/12:  We will send reminder to DrStark for colonoscopy due... ~  12/12:  DrStark sent letter to pt regarding colonoscopy due... ~  3/13:  We reviewed the need for f/u screening colonoscopy & she is encouraged to call & set this up...  RENAL CYST (ICD-593.2) - followed by [id number]... AbdSonar 9/09 showed bilat renal cysts and stones in kidneys.  VITAMIN D DEFICIENCY (ICD-268.9) - supposed to be taking Vit D OTC supplement 2000 u daily. ~  BMD here 9/08 showed TScores -0.3 in Spine, and -0.4 in left FemNeck ~  labs 6/10 showed Vit D level = 15 and she was started on OTC Vit D 2000u daily. ~  BMD here 6/11 was normal w/ TScores -0.5 in Spine, and -0.3 in FemNecks... rec to continue Calcium, MVI, Vit D. ~  labs 2/12 showed Vit D level = 19 and she is reminded to take supplement daily. ~  Labs 9/12 showed Vit D level = 21... rec to switch to 50000u supplement Rx weekly. ~  Labs 3/13 showed Vit D level = 23... ?if taking the 50K weekly, rec to stay on this!  ANXIETY (ICD-300.00) - prev used Alprazolam 0.5mg  Prn but it is no longer on her med list. SOMATIC DYSFUNCTION (ICD-739.9) - hx mult somatic complaints...  Health Maintenance >> ~  GYN: followed by DrMarshall's office for PAP smears etc... ~  Mammograms at [facility]  were OK according to the pt... ~  Immunizations:  she had Flu shot 2010 at work... ?if they gave her a Pneumonia shot... ?last Tetanus vaccine. ~  She declines the 2012 Flu vaccine here, encouraged to get it at work...   No past surgical history on file.   Outpatient Encounter Prescriptions as of 03/29/2011  Medication Sig Dispense Refill  . albuterol (PROAIR HFA) 108 (90 BASE) MCG/ACT inhaler Inhale 2 puffs into the lungs every 6 (six) hours as needed.  1 Inhaler  11  . cetirizine (ZYRTEC) 10 MG tablet Take 10 mg by mouth daily as needed.        Marland Kitchen DEXILANT 60 MG capsule TAKE 1 CAPSULE DAILY  90 capsule  3  . dextromethorphan (DELSYM) 30 MG/5ML liquid Take 60 mg by mouth as needed.        . Fluticasone-Salmeterol (ADVAIR DISKUS) 100-50 MCG/DOSE AEPB Inhale 1 puff into the lungs every 12 (twelve) hours.        . Glucose Blood (ACCU-CHEK AVIVA VI) Check blood sugar daily       . glucose blood (ONE TOUCH ULTRA TEST) test strip Use as directed to check blood sugar 250.00  100 each  10  . metFORMIN (GLUCOPHAGE) 500 MG tablet TAKE 2 TABLETS TWICE A DAY WITH BREAKFAST AND DINNER  360 tablet  3  . mometasone (NASONEX) 50 MCG/ACT nasal spray 2 sprays by Nasal route at bedtime as needed.        . Multiple Vitamins-Minerals (MULTIVITAMIN WITH MINERALS) tablet Take 1 tablet by mouth daily.        . pioglitazone (ACTOS) 45 MG tablet Take 1 tablet (45 mg total) by mouth daily.  30 tablet  11  . sitaGLIPtan (JANUVIA) 100  MG tablet Take 100 mg by mouth daily. At dinner       . Vitamin D, Ergocalciferol, (DRISDOL) 50000 UNITS CAPS Take 1 capsule (50,000 Units total) by mouth every 7 (seven) days.  4 capsule  11  . DISCONTD: Cholecalciferol (VITAMIN D3) 1000 UNITS tablet 2 capsules daily       . DISCONTD: predniSONE (STERAPRED UNI-PAK) 5 MG TABS 6 day pack take as directed  21 tablet  0    Allergies  Allergen Reactions  . Penicillins     REACTION: rash and sob    Current Medications, Allergies, Past  Medical History, Past Surgical History, Family History, and Social History were reviewed in [employer] record.    Review of Systems        See HPI - all other systems neg except as noted... The patient denies anorexia, fever, weight loss, weight gain, vision loss, decreased hearing, hoarseness, chest pain, syncope, dyspnea on exertion, peripheral edema, prolonged cough, headaches, hemoptysis, abdominal pain, melena, hematochezia, severe indigestion/heartburn, hematuria, incontinence, muscle weakness, suspicious skin lesions, transient blindness, difficulty walking, depression, unusual weight change, abnormal bleeding, enlarged lymph nodes, and angioedema.     Objective:   Physical Exam    WD, WN, 55 y/o BF in NAD...   GENERAL:  Alert & oriented; pleasant & cooperative... HEENT:  [place]/AT, EOM-wnl,  EACs-clear, TMs-wnl, NOSE-clear, THROAT-clear & wnl. NECK:  Supple w/ full ROM; no JVD; normal carotid impulses w/o bruits; no thyromegaly or nodules palpated; no lymphadenopathy. CHEST:  Clear to P & A; without wheezes/ rales/ or rhonchi heard... HEART:  Regular Rhythm; without murmurs/ rubs/ or gallops. ABDOMEN:  Soft & nontender; normal bowel sounds; no organomegaly or masses detected, neg CVA tenderness.  EXT: without deformities, mild arthritic changes; no varicose veins/ venous insuffic/ or edema. NEURO:  CN's intact; no focal neuro deficits... DERM:  No lesions noted; no rash etc...  RADIOLOGY DATA:  Reviewed in the EPIC EMR & discussed w/ the patient...  LABORATORY DATA:  Reviewed in the EPIC EMR & discussed w/ the patient...   Assessment & Plan:    AR & Asthma> on Advair100 but only using this Prn & encouraged to use it twice daily every day (plus Proair HFA as needed); she uses Zyrtek & Nasonex for her nasal allergies...     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     CHOL> she has been recommended to take  statin therapy but she declines preferring diet alone despite her poor numbers; now agrees to CRES10...     DM> on Metformin500-2Bid, Actos45, Januvia100; plus DM diet efforts; today BS=131, A1c=7.9, and she is followed by DrEllison now but his med list last OV looked incomplete; she is encouraged to maintain regular f/u w/ her DM specialist...     GERD> on Dexilant60mg /d & followed by DrStark (Omep doesn't work for her)...     Divertics, Polyps> she had 4mm adenomatous polyp removed 11/07 & 80yr f/u colon due soon, she received letter from DrStark but has yet to set up her f/u colon...     Elev LFTs> mild incr LFTs, likely FLD (per sonar 2009), she weighs 162# (no change) & we discussed diet, exercise, get wt down...     Vit D Defic> on VitD 1000u OTC daily supplement; but VitD level has only improved to 23 & we will switch to VitD 50K weekly, reminded to take it every week.  Anxiety> she has mult somatic complaints, prev used alprazolam as needed but it is no longer on her list...   Patient's Medications  New Prescriptions   ROSUVASTATIN (CRESTOR) 10 MG TABLET    Take 1 tablet (10 mg total) by mouth at bedtime.  Previous Medications   ALBUTEROL (PROAIR HFA) 108 (90 BASE) MCG/ACT INHALER    Inhale 2 puffs into the lungs every 6 (six) hours as needed.   CETIRIZINE (ZYRTEC) 10 MG TABLET    Take 10 mg by mouth daily as needed.     DEXILANT 60 MG CAPSULE    TAKE 1 CAPSULE DAILY   DEXTROMETHORPHAN (DELSYM) 30 MG/5ML LIQUID    Take 60 mg by mouth as needed.     FLUTICASONE-SALMETEROL (ADVAIR DISKUS) 100-50 MCG/DOSE AEPB    Inhale 1 puff into the lungs every 12 (twelve) hours.     GLIMEPIRIDE (AMARYL) 1 MG TABLET    Take 0-1 tablet by mouth with breakfast as directed by Dr. Everardo All   GLUCOSE BLOOD (ACCU-CHEK AVIVA VI)    Check blood sugar daily    GLUCOSE BLOOD (ONE TOUCH ULTRA TEST) TEST STRIP    Use as directed to check blood sugar 250.00   METFORMIN (GLUCOPHAGE) 500 MG TABLET    TAKE 2  TABLETS TWICE A DAY WITH BREAKFAST AND DINNER   MOMETASONE (NASONEX) 50 MCG/ACT NASAL SPRAY    2 sprays by Nasal route at bedtime as needed.     MULTIPLE VITAMINS-MINERALS (MULTIVITAMIN WITH MINERALS) TABLET    Take 1 tablet by mouth daily.     PIOGLITAZONE (ACTOS) 45 MG TABLET    Take 1 tablet (45 mg total) by mouth daily.   VITAMIN D, ERGOCALCIFEROL, (DRISDOL) 50000 UNITS CAPS    Take 1 capsule (50,000 Units total) by mouth every 7 (seven) days.  Modified Medications   Modified Medication Previous Medication   JANUVIA 100 MG TABLET sitaGLIPtan (JANUVIA) 100 MG tablet      TAKE 1 TABLET BY MOUTH DAILY AT DINNER    Take 100 mg by mouth daily. At dinner   Discontinued Medications   CHOLECALCIFEROL (VITAMIN D3) 1000 UNITS TABLET    2 capsules daily    PREDNISONE (STERAPRED UNI-PAK) 5 MG TABS    6 day pack take as directed

## 2011-03-29 NOTE — Patient Instructions (Signed)
Today we updated your med list in our EPIC system...    Continue your current medications the same & as directed by DrEllison...  We decided to add CRESTOR 10mg  for your Lipids...    Take 1 tab daily & we will recheck your lipid plan on return...  Please stay on your diet & work on weight reduction...  Call for any questions...  Let's plan a follow up visit w/ fasting blood work in 6 months.Marland KitchenMarland Kitchen

## 2011-04-03 ENCOUNTER — Other Ambulatory Visit: Payer: Self-pay | Admitting: Pulmonary Disease

## 2011-04-04 ENCOUNTER — Telehealth: Payer: Self-pay

## 2011-04-04 NOTE — Telephone Encounter (Signed)
As long as you are off steroids, you don't need the glimepiride

## 2011-04-04 NOTE — Telephone Encounter (Signed)
Pt called stating she received results of A1C and will restart Glimepiride but is unsure of the dose. She has Rxs for 1 mg, 2 mg, and 5 mg, please advise.

## 2011-04-04 NOTE — Telephone Encounter (Signed)
Pt informed of MD's advisement. 

## 2011-05-08 ENCOUNTER — Encounter: Payer: Self-pay | Admitting: Gastroenterology

## 2011-05-30 ENCOUNTER — Ambulatory Visit (INDEPENDENT_AMBULATORY_CARE_PROVIDER_SITE_OTHER): Payer: 59 | Admitting: Endocrinology

## 2011-05-30 ENCOUNTER — Encounter: Payer: Self-pay | Admitting: Endocrinology

## 2011-05-30 VITALS — BP 128/80 | HR 90 | Temp 98.3°F | Ht 63.0 in | Wt 164.0 lb

## 2011-05-30 DIAGNOSIS — E119 Type 2 diabetes mellitus without complications: Secondary | ICD-10-CM

## 2011-05-30 MED ORDER — TRAMADOL HCL 50 MG PO TABS: 50.0000 mg | ORAL_TABLET | Freq: Three times a day (TID) | ORAL | Status: AC | PRN

## 2011-05-30 MED ORDER — DOXYCYCLINE HYCLATE 100 MG PO TABS: 100.0000 mg | ORAL_TABLET | Freq: Two times a day (BID) | ORAL | Status: AC

## 2011-05-30 NOTE — Patient Instructions (Addendum)
i have sent 2 prescriptions to your pharmacy: for an antibiotic, and cough medication Increase the glimepiride to 2 mg each morning Loratadine-d (non-prescription) will help your congestion. Please come back for a follow-up appointment for 1 month.

## 2011-05-30 NOTE — Progress Notes (Signed)
Subjective:    Patient ID: Lindsay Henderson, female    DOB: May 25, 1955, 56 y.o.   MRN: [id number]  HPI Pt returns for f/u of type 2 DM (dx'ed 2000; no known complications).  She is off prednisone now.  no cbg record, but states cbg's are in the high-100's.   Pt states few days of slight dry-quality cough in the chest, and assoc nasal congestion. Past Medical History  Diagnosis Date  . Allergic rhinitis   . Asthma   . Anomalous atrioventricular excitation   . History of supraventricular tachycardia   . Hypercholesteremia   . Type II or unspecified type diabetes mellitus without mention of complication, not stated as uncontrolled   . GERD (gastroesophageal reflux disease)   . Gastritis   . Diverticulosis of colon   . Adenomatous colon polyp 11/2005  . Nephrolithiasis   . Renal cyst   . Vitamin d deficiency   . Anxiety   . Somatic dysfunction     Past Surgical History  Procedure Date  . Tubal ligation   . Cardiac electrophysiology mapping and ablation     History   Social History  . Marital Status: Married    Spouse Name: N/A    Number of Children: 4  . Years of Education: N/A   Occupational History  . [employer]    Social History Main Topics  . Smoking status: Never Smoker   . Smokeless tobacco: Never Used  . Alcohol Use: No  . Drug Use: No  . Sexually Active: Not on file   Other Topics Concern  . Not on file   Social History Narrative  . No narrative on file    Current Outpatient Prescriptions on File Prior to Visit  Medication Sig Dispense Refill  . albuterol (PROAIR HFA) 108 (90 BASE) MCG/ACT inhaler Inhale 2 puffs into the lungs every 6 (six) hours as needed.  1 Inhaler  11  . cetirizine (ZYRTEC) 10 MG tablet Take 10 mg by mouth daily as needed.        Marland Kitchen dextromethorphan (DELSYM) 30 MG/5ML liquid Take 60 mg by mouth as needed.        . Fluticasone-Salmeterol (ADVAIR DISKUS) 100-50 MCG/DOSE AEPB Inhale 1 puff into the lungs every 12 (twelve)  hours.        Marland Kitchen glimepiride (AMARYL) 1 MG tablet Take 2 mg by mouth daily before breakfast.       . Glucose Blood (ACCU-CHEK AVIVA VI) Check blood sugar daily       . glucose blood (ONE TOUCH ULTRA TEST) test strip Use as directed to check blood sugar 250.00  100 each  10  . JANUVIA 100 MG tablet TAKE 1 TABLET BY MOUTH DAILY AT DINNER  90 tablet  3  . metFORMIN (GLUCOPHAGE) 500 MG tablet TAKE 2 TABLETS TWICE A DAY WITH BREAKFAST AND DINNER  360 tablet  3  . mometasone (NASONEX) 50 MCG/ACT nasal spray 2 sprays by Nasal route at bedtime as needed.        . Multiple Vitamins-Minerals (MULTIVITAMIN WITH MINERALS) tablet Take 1 tablet by mouth daily.        . pioglitazone (ACTOS) 45 MG tablet Take 1 tablet (45 mg total) by mouth daily.  30 tablet  11  . rosuvastatin (CRESTOR) 10 MG tablet Take 1 tablet (10 mg total) by mouth at bedtime.  30 tablet  11  . Vitamin D, Ergocalciferol, (DRISDOL) 50000 UNITS CAPS Take 1 capsule (50,000 Units total) by mouth every  7 (seven) days.  4 capsule  11  . omeprazole (PRILOSEC) 40 MG capsule Take 1 capsule (40 mg total) by mouth daily.  30 capsule  11    Allergies  Allergen Reactions  . Penicillins     REACTION: rash and sob    Family History  Problem Relation Age of Onset  . Diabetes Father   . Breast cancer Mother   . Hypertension Father   . Hyperlipidemia Father     BP 128/80  Pulse 90  Temp(Src) 98.3 F (36.8 C) (Oral)  Ht 5\' 3"  (1.6 m)  Wt 164 lb (74.39 kg)  BMI 29.05 kg/m2  SpO2 98%   Review of Systems denies hypoglycemia.  Fever is better now.      Objective:   Physical Exam VITAL SIGNS:  See vs page GENERAL: no distress head: no deformity eyes: no periorbital swelling, no proptosis external nose and ears are normal mouth: no lesion seen Both tm's are red  Lab Results  Component Value Date   HGBA1C 7.9* 03/24/2011       Assessment & Plan:  DM.  needs increased rx URI.  New Asthma.   This complicates the rx of dm, but it  is better now

## 2011-06-01 ENCOUNTER — Ambulatory Visit: Payer: 59 | Admitting: Endocrinology

## 2011-06-01 ENCOUNTER — Ambulatory Visit (INDEPENDENT_AMBULATORY_CARE_PROVIDER_SITE_OTHER): Payer: 59 | Admitting: Gastroenterology

## 2011-06-01 ENCOUNTER — Encounter: Payer: Self-pay | Admitting: Gastroenterology

## 2011-06-01 VITALS — BP 120/78 | HR 88 | Ht 62.0 in | Wt 164.1 lb

## 2011-06-01 DIAGNOSIS — Z86010 Personal history of colonic polyps: Secondary | ICD-10-CM

## 2011-06-01 DIAGNOSIS — K219 Gastro-esophageal reflux disease without esophagitis: Secondary | ICD-10-CM

## 2011-06-01 MED ORDER — PEG-KCL-NACL-NASULF-NA ASC-C 100 G PO SOLR: 1.0000 | Freq: Once | ORAL | Status: DC

## 2011-06-01 NOTE — Patient Instructions (Signed)
You have been scheduled for a colonoscopy with propofol. Please follow written instructions given to you at your visit today.  Please pick up your prep kit at the pharmacy within the next 1-3 days. Stop taking Dexilant and start taking omeprazole 40 mg tablets daily. A prescription has been sent to your pharmacy.  The phone number for [facility] is [phone] to schedule an appointment. Your husbands Recall Colonoscopy is 11/2012. We will contact him when it gets closer to that time. cc: Rubye Oaks, MD

## 2011-06-01 NOTE — Progress Notes (Addendum)
History of Present Illness: This is a 56 year old female with chronic GERD and a history of adenomatous colon polyps. Her reflux symptoms are well-controlled on omeprazole and that every time she tries to discontinue or reduce the frequency of taking the medication she has breakthrough reflux symptoms. Upper endoscopy in 2000 non-showed mild gastritis. She is due for surveillance colonoscopy. She has concerns about kidney stones that were diagnosed several years ago would like to followup with her urologist. Denies weight loss, abdominal pain, constipation, diarrhea, change in stool caliber, melena, hematochezia, nausea, vomiting, dysphagia, reflux symptoms, chest pain.  Review of Systems: Pertinent positive and negative review of systems were noted in the above HPI section. All other review of systems were otherwise negative.  Current Medications, Allergies, Past Medical History, Past Surgical History, Family History and Social History were reviewed in [employer] record.  Physical Exam: General: Well developed , well nourished, no acute distress Head: Normocephalic and atraumatic Eyes:  sclerae anicteric, EOMI Ears: Normal auditory acuity Mouth: No deformity or lesions Neck: Supple, no masses or thyromegaly Lungs: Clear throughout to auscultation Heart: Regular rate and rhythm; no murmurs, rubs or bruits Abdomen: Soft, non tender and non distended. No masses, hepatosplenomegaly or hernias noted. Normal Bowel sounds Rectal: Deferred to colonoscopy Musculoskeletal: Symmetrical with no gross deformities  Skin: No lesions on visible extremities Pulses:  Normal pulses noted Extremities: No clubbing, cyanosis, edema or deformities noted Neurological: Alert oriented x 4, grossly nonfocal Cervical Nodes:  No significant cervical adenopathy Inguinal Nodes: No significant inguinal adenopathy Psychological:  Alert and cooperative. Normal mood and affect  Assessment and  Recommendations:  1. History of adenomatous colon polyps. She is due for surveillance colonoscopy. The risks, benefits, and alternatives to colonoscopy with possible biopsy, possible polypectomy and possible injection of hemorrhoids were discussed with the patient and they consent to proceed.   2. GERD. Trial of omeprazole 40 mg daily. Consider reducing to 20 mg daily if her symptoms remain adequately controlled.  3. History of kidney stones. Patient will schedule followup with her urologist.

## 2011-06-02 ENCOUNTER — Telehealth: Payer: Self-pay | Admitting: Gastroenterology

## 2011-06-02 MED ORDER — OMEPRAZOLE 40 MG PO CPDR: 40.0000 mg | DELAYED_RELEASE_CAPSULE | Freq: Every day | ORAL | Status: DC

## 2011-06-02 NOTE — Telephone Encounter (Signed)
Prescription resent to the pharmacy.  

## 2011-06-29 LAB — HM DIABETES EYE EXAM

## 2011-07-05 ENCOUNTER — Ambulatory Visit: Payer: 59 | Admitting: Endocrinology

## 2011-07-06 ENCOUNTER — Ambulatory Visit (INDEPENDENT_AMBULATORY_CARE_PROVIDER_SITE_OTHER): Payer: 59 | Admitting: Endocrinology

## 2011-07-06 ENCOUNTER — Other Ambulatory Visit (INDEPENDENT_AMBULATORY_CARE_PROVIDER_SITE_OTHER): Payer: 59

## 2011-07-06 ENCOUNTER — Encounter: Payer: Self-pay | Admitting: Endocrinology

## 2011-07-06 VITALS — BP 112/78 | HR 83 | Temp 97.8°F | Ht 62.0 in | Wt 167.0 lb

## 2011-07-06 DIAGNOSIS — E119 Type 2 diabetes mellitus without complications: Secondary | ICD-10-CM

## 2011-07-06 LAB — HEMOGLOBIN A1C: Hgb A1c MFr Bld: 8.2 % — ABNORMAL HIGH (ref 4.6–6.5)

## 2011-07-06 MED ORDER — BROMOCRIPTINE MESYLATE 2.5 MG PO TABS: 2.5000 mg | ORAL_TABLET | Freq: Every day | ORAL | Status: DC

## 2011-07-06 NOTE — Patient Instructions (Addendum)
Please add "bromocriptine," to help your blood sugar. It has possible side effects of nausea and dizziness.  These go away with time.  You can avoid these by taking it at bedtime, and by taking just take 1/2 pill for the first week.    blood tests are being requested for you today.  You will receive a letter with results.    Please come back for a follow-up appointment in 3 months.

## 2011-07-06 NOTE — Progress Notes (Signed)
Subjective:    Patient ID: Lindsay Henderson, female    DOB: September 27, 1955, 56 y.o.   MRN: [id number]  HPI Pt returns for f/u of type 2 DM (dx'ed 2000; no known complications).  She is off prednisone now.  no cbg record, but states cbg's vary from 150-200.  She is still off steroids.   Pt states few mos of moderate tremor of the hands, and assoc "sick feeling." Past Medical History  Diagnosis Date  . Allergic rhinitis   . Asthma   . Anomalous atrioventricular excitation   . History of supraventricular tachycardia   . Hypercholesteremia   . Type II or unspecified type diabetes mellitus without mention of complication, not stated as uncontrolled   . GERD (gastroesophageal reflux disease)   . Gastritis   . Diverticulosis of colon   . Adenomatous colon polyp 11/2005  . Nephrolithiasis   . Renal cyst   . Vitamin d deficiency   . Anxiety   . Somatic dysfunction     Past Surgical History  Procedure Date  . Tubal ligation   . Cardiac electrophysiology mapping and ablation     History   Social History  . Marital Status: Married    Spouse Name: N/A    Number of Children: 4  . Years of Education: N/A   Occupational History  . [employer]    Social History Main Topics  . Smoking status: Never Smoker   . Smokeless tobacco: Never Used  . Alcohol Use: No  . Drug Use: No  . Sexually Active: Not on file   Other Topics Concern  . Not on file   Social History Narrative  . No narrative on file    Current Outpatient Prescriptions on File Prior to Visit  Medication Sig Dispense Refill  . albuterol (PROAIR HFA) 108 (90 BASE) MCG/ACT inhaler Inhale 2 puffs into the lungs every 6 (six) hours as needed.  1 Inhaler  11  . cetirizine (ZYRTEC) 10 MG tablet Take 10 mg by mouth daily as needed.        Marland Kitchen dextromethorphan (DELSYM) 30 MG/5ML liquid Take 60 mg by mouth as needed.        . Fluticasone-Salmeterol (ADVAIR DISKUS) 100-50 MCG/DOSE AEPB Inhale 1 puff into the lungs every 12  (twelve) hours.        Marland Kitchen glimepiride (AMARYL) 1 MG tablet Take 2 mg by mouth daily before breakfast.       . Glucose Blood (ACCU-CHEK AVIVA VI) Check blood sugar daily       . glucose blood (ONE TOUCH ULTRA TEST) test strip Use as directed to check blood sugar 250.00  100 each  10  . JANUVIA 100 MG tablet TAKE 1 TABLET BY MOUTH DAILY AT DINNER  90 tablet  3  . metFORMIN (GLUCOPHAGE) 500 MG tablet TAKE 2 TABLETS TWICE A DAY WITH BREAKFAST AND DINNER  360 tablet  3  . mometasone (NASONEX) 50 MCG/ACT nasal spray 2 sprays by Nasal route at bedtime as needed.        . Multiple Vitamins-Minerals (MULTIVITAMIN WITH MINERALS) tablet Take 1 tablet by mouth daily.        Marland Kitchen omeprazole (PRILOSEC) 40 MG capsule Take 1 capsule (40 mg total) by mouth daily.  30 capsule  11  . peg 3350 powder (MOVIPREP) 100 G SOLR Take 1 kit (100 g total) by mouth once.  1 kit  0  . rosuvastatin (CRESTOR) 10 MG tablet Take 1 tablet (10 mg total)  by mouth at bedtime.  30 tablet  11  . Vitamin D, Ergocalciferol, (DRISDOL) 50000 UNITS CAPS Take 1 capsule (50,000 Units total) by mouth every 7 (seven) days.  4 capsule  11  . bromocriptine (PARLODEL) 2.5 MG tablet Take 1 tablet (2.5 mg total) by mouth daily.  30 tablet  11    Allergies  Allergen Reactions  . Actos (Pioglitazone)     tremor  . Penicillins     REACTION: rash and sob    Family History  Problem Relation Age of Onset  . Diabetes Father   . Breast cancer Mother   . Hypertension Father   . Hyperlipidemia Father     BP 112/78  Pulse 83  Temp 97.8 F (36.6 C) (Oral)  Ht 5\' 2"  (1.575 m)  Wt 167 lb (75.751 kg)  BMI 30.54 kg/m2  SpO2 97%  Review of Systems denies hypoglycemia and fever.    Objective:   Physical Exam VITAL SIGNS:  See vs page GENERAL: no distress Neuro:  Slight tremor of the hands  Lab Results  Component Value Date   HGBA1C 8.2* 07/06/2011      Assessment & Plan:  Tremor, mild, uncertain etiology, new DM.  needs increased rx

## 2011-07-07 ENCOUNTER — Telehealth: Payer: Self-pay | Admitting: *Deleted

## 2011-07-07 NOTE — Telephone Encounter (Signed)
Called pt to inform of lab results, pt informed via VM and to callback office with any questions/concerns (letter also mailed to pt).  

## 2011-07-21 ENCOUNTER — Telehealth: Payer: Self-pay | Admitting: Pulmonary Disease

## 2011-07-21 MED ORDER — LEVOFLOXACIN 500 MG PO TABS: 500.0000 mg | ORAL_TABLET | Freq: Every day | ORAL | Status: AC

## 2011-07-21 NOTE — Telephone Encounter (Signed)
Called and spoke with pt and she stated that she has sinus infection with yellow drainage.  Cough with thick yellow sputum.  X 1 week. Ears hurt and she stated that her sugars have been elevated since she has been sick. Dr. Everardo All gave her doxy in may and this did help a lot.  SN please advise. Thanks  Allergies  Allergen Reactions  . Actos (Pioglitazone)     tremor  . Penicillins     REACTION: rash and sob

## 2011-07-21 NOTE — Telephone Encounter (Signed)
Called and spoke with pt and per SN---call in levaquin 500mg   #10  1 daily for the sinus infection, mucinex otc 2 po bid with lots of fluids/water, saline nasal mist for now.  Called and spoke with pt and she is aware of SN recs.  Nothing further needed.

## 2011-07-27 ENCOUNTER — Encounter: Payer: 59 | Admitting: Gastroenterology

## 2011-09-14 ENCOUNTER — Telehealth: Payer: Self-pay | Admitting: Gastroenterology

## 2011-09-14 ENCOUNTER — Encounter: Payer: 59 | Admitting: Gastroenterology

## 2011-09-14 ENCOUNTER — Encounter: Payer: Self-pay | Admitting: Internal Medicine

## 2011-09-14 ENCOUNTER — Ambulatory Visit (AMBULATORY_SURGERY_CENTER): Payer: 59 | Admitting: *Deleted

## 2011-09-14 ENCOUNTER — Telehealth: Payer: Self-pay | Admitting: *Deleted

## 2011-09-14 VITALS — Ht 62.0 in | Wt 163.1 lb

## 2011-09-14 DIAGNOSIS — K219 Gastro-esophageal reflux disease without esophagitis: Secondary | ICD-10-CM

## 2011-09-14 DIAGNOSIS — Z860100 Personal history of colon polyps, unspecified: Secondary | ICD-10-CM

## 2011-09-14 DIAGNOSIS — Z1211 Encounter for screening for malignant neoplasm of colon: Secondary | ICD-10-CM

## 2011-09-14 MED ORDER — PEG-KCL-NACL-NASULF-NA ASC-C 100 G PO SOLR: ORAL | Status: DC

## 2011-09-14 NOTE — Telephone Encounter (Signed)
OK to start Dexilant 60 mg daily No need for office appt since I just saw her in May

## 2011-09-14 NOTE — Telephone Encounter (Signed)
Yes charge 

## 2011-09-14 NOTE — Progress Notes (Signed)
No allergies to eggs or soy products 

## 2011-09-14 NOTE — Telephone Encounter (Signed)
Dr Russella Dar, This pt states that the Omeprazole isn't helping with her symptoms.  Would like to go to taking Dexilant.  Can she have an RX for this?  Or would you like to see her first- she has a colonoscopy on 09-28-11.  Thank you, Baxter Hire

## 2011-09-15 MED ORDER — DEXLANSOPRAZOLE 60 MG PO CPDR: 60.0000 mg | DELAYED_RELEASE_CAPSULE | Freq: Every day | ORAL | Status: DC

## 2011-09-15 NOTE — Telephone Encounter (Signed)
Dexilant sent to pt's pharmacy and pt notified.  Understanding voiced

## 2011-09-18 ENCOUNTER — Encounter: Payer: Self-pay | Admitting: Gastroenterology

## 2011-09-27 ENCOUNTER — Telehealth: Payer: Self-pay | Admitting: Gastroenterology

## 2011-09-27 NOTE — Telephone Encounter (Signed)
Yes charge 

## 2011-09-28 ENCOUNTER — Encounter: Payer: 59 | Admitting: Gastroenterology

## 2011-09-29 ENCOUNTER — Ambulatory Visit (INDEPENDENT_AMBULATORY_CARE_PROVIDER_SITE_OTHER): Payer: Self-pay | Admitting: Pulmonary Disease

## 2011-09-29 ENCOUNTER — Encounter: Payer: Self-pay | Admitting: Pulmonary Disease

## 2011-09-29 VITALS — BP 124/88 | HR 98 | Temp 98.5°F | Ht 62.0 in | Wt 162.0 lb

## 2011-09-29 DIAGNOSIS — E7800 Pure hypercholesterolemia, unspecified: Secondary | ICD-10-CM

## 2011-09-29 DIAGNOSIS — J45909 Unspecified asthma, uncomplicated: Secondary | ICD-10-CM

## 2011-09-29 DIAGNOSIS — J309 Allergic rhinitis, unspecified: Secondary | ICD-10-CM

## 2011-09-29 DIAGNOSIS — I456 Pre-excitation syndrome: Secondary | ICD-10-CM

## 2011-09-29 DIAGNOSIS — F411 Generalized anxiety disorder: Secondary | ICD-10-CM

## 2011-09-29 DIAGNOSIS — I498 Other specified cardiac arrhythmias: Secondary | ICD-10-CM

## 2011-09-29 DIAGNOSIS — K219 Gastro-esophageal reflux disease without esophagitis: Secondary | ICD-10-CM

## 2011-09-29 DIAGNOSIS — E119 Type 2 diabetes mellitus without complications: Secondary | ICD-10-CM

## 2011-09-29 DIAGNOSIS — K5730 Diverticulosis of large intestine without perforation or abscess without bleeding: Secondary | ICD-10-CM

## 2011-09-29 MED ORDER — FLUTICASONE-SALMETEROL 100-50 MCG/DOSE IN AEPB: 1.0000 | INHALATION_SPRAY | Freq: Two times a day (BID) | RESPIRATORY_TRACT | Status: DC

## 2011-09-29 MED ORDER — ALBUTEROL SULFATE HFA 108 (90 BASE) MCG/ACT IN AERS: 2.0000 | INHALATION_SPRAY | Freq: Four times a day (QID) | RESPIRATORY_TRACT | Status: DC | PRN

## 2011-09-29 NOTE — Progress Notes (Addendum)
Subjective:    Patient ID: Lindsay Henderson, female    DOB: [birth year], 56 y.o.   MRN: [id number]  HPI 56 y/o BF here for a follow up visit... she has multiple medical problems including hx Asthma, hx WPW, Chol, DM, GERD/ Divertics/ Polyps, Vit D defic, Anxiety/ Somatization, & hx poor compliance w/ med rx...  ~  February 21, 2010:    She denies breathing problems or Asthma attacks;  using her inhalers just as needed she says & not inclined to incr to regular preventive dosing...    She tells me that she has once again stopped the Lipitor & FLP is similar to prev w/ TChol 216, LDL 164, & she is content w/ these numbers & refuses to take Statin rx regularly- I offered her any med she wanted (cheaper copay than the Lipitor), vs Lipid Clinic but she declines;  she will work on diet & try to get LDL<130, we discussed low chol, low fat...    She notes BS at home all <145 range;  Labs here show FBS=127, A1c=7.5 (it was 7.7 last time), and she is pleased w/ the improvement on Metform500- 2Bid, Glimep4mg /d, & JANUVIA 100mg /d;  we discussed diet + exercise, & she is considering weight watchers.    Note Vit D level low at 19 and she is supposed to be on a Women's Formula MVI + Vit D OTC supplement of 2000 u daily;  BMD 6/11 here was WNL w/ TScores -0.3 to -0.5;  she is encouraged to take the Vit D daily.  ~  September 29, 2010:  15mo ROV & she requests refills for all meds 90d supplies; she declines to receive the 2012 Flu vaccine...    AR & Asthma> on Advair100 but only using this Prn she says (plus Proair HFA as needed); she uses Zyrtek & Nasonex for her nasal allergies; denies breathing problems or asthma exac...    Hx WPW & SVT> followed by DrTaylor w/ hx catheter ablation in 2004; she denies palpit, dizzy, syncope, etc...    CHOL> she has been recommended to take statin therapy but she declines preferring diet alone despite her poor numbers...    DM> on Metformin500-2Bid, Glimep4mg /d, Januvia100mg /d;  plus DM diet efforts; today BS=129, A1c=7.7, and she wants to avoid insulin therefore refer to Endocrine/DM; also wants [facility] consult.    GERD> on Dexilant60mg /d & followed by DrStark (Omep doesn't work for her)...    Divertics, Polyps> she had 4mm adenomatous polyp removed 11/07 & 64yr f/u colon due soon, we will send memo to GI/ DrStark...    Elev LFTs> mild incr LFTs, likely FLD (per sonar 2009), she weighs 162# (no change) & we discussed diet, exercise, get wt down...    Vit D Defic> on VitD 1000u OTC daily supplement; but VitD level has only improved to 21 & we will switch to VitD 50K weekly...    Anxiety> she has mult somatic complaints, prev used alprazolam as needed but it is no longer on her list...  ~  March 29, 2011:  57mo ROV & she has seen DrEllison monthly for DM control & has been to the [facility] center;  His notes indicate that she has been inconsistent w/ meds- eg. ran out of Glimep in Feb & A1c is up to 7.9; she indicates that he wanted to change Januvia to Victoza...  She reports that her breathing is stable on Advair/ Proventil... See prob list below>> LABS 3/13:  FLP- no better, not  at goals on diet alone;  Chems- ok x BS=131, A1c=7.9;  CBC- wnl;  TSH=1.27;  VitD=23;  UA=clear  ~  September 29, 2011:  57mo ROV & [name] continues to complain of sinus issues- pressure, drainage, discomfort; on Zyrtek, Nasonex, Advair, Delsym and we discussed Rx w/ alt antihist, Saline, Nasonex, etc...     She continues to follow up w/ DrEllison for DM> last A1c=8.2 in Jun2013; meds adjust & placed on Parlodel2.5mg - "it's a drug that controls everything but it didn't work for me, I felt jittery, etc"; she has upcoming appt & labs 9/13 showed BS=173, A1c=7.8 (sl improved); plans per DrEllison...    We reviewed prob list, meds, xrays and labs> see below for updates >> LABS 9/13:  FLP- at goals x LDL=104 on Cres10;  Chems ok x BS=173 A1c=7.8 on Metform/ Januv/ Amaryl...          Problem List:       ALLERGIC RHINITIS (ICD-477.9) - controlled on Zyrtek, Nasonex, & Saline...  Note: ENT eval in past was neg.  ASTHMA (ICD-493.90) - controlled on ADVAIR 100 Bid (but only using it Prn now), & PROAIR Prn... uses Mucinex Prn as well. 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 ~  She denies any recent asthma exac...  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 ~  Myoview 7/06 was normal w/o ischemia, norm wall motion, EF=93%...  ~  2DEcho 7/06 w/ norm LVF... ~  EKG 6/10 shows NSR, rate 90, NAD... ~  She has intermittent mild palpit, none recently...   HYPERCHOLESTEROLEMIA (ICD-272.0) - prev on Lipitor20 & she tolerated it well, but everytime she starts it- she stops it again w/o explanation (once because her husb had a reaction to it); she declines other statins, declines Lipid Clinic referral, wants to do diet alone. ~  FLP 9/08 on Lip20 showed TChol 140, TG 89, HDL 37, LDL 85 ~  FLP 6/09 on Lip20 showed TChol 138, TG 55, HDL 35, LDL 92... rec incr exercise, same med. ~  FLP 6/10 ?on Lip20 showed TChol 181, TG 121, HDL 41, LDL 116... rec> take meds regularly! ~  FLP 5/11 off med showed TChol 220, TG 143, HDL 47, LDL 165... rec> restart Lip20 every day! ~  FLP 2/12 on diet alone showed TChol 216, TG 116, HDL 46, LDL 164... she refuses meds, & will do the best she can on diet alone. ~  FLP 3/13 on diet alone showed TChol 215, TG 80, HDL 58, LDL 150... She agrees to try CRESTOR10mg /d. ~  FP 9/13 on Cres10 showed TChol 169. TG 102, HDL 45, LDL 104... Improved, continue same, better diet.  DIABETES MELLITUS, TYPE II (ICD-250.00) - now  on METFORMIN 500mg - 2Bid, JANUVIA 100mg /d, AMARYL 1mg - 2/d, & PARLODEL 2.5mg /d per DrEllison ~  labs 9/08 on GlucovQam showed BS= 122, HgA1c= 7.4... she was told to incr Glucovance Bid- but  didn't do it! ~  labs 6/09 showed BS= 166, HgA1c= 8.2.Marland KitchenMarland Kitchen rec to incr the Glucovance to Bid!!! ~  labs 6/10 showed BS= 161, A1c= 8.1, Microalb= neg... decided to change Rx to Metformin 1000Bid, Glimepiride 4mg /d, Januvia 100mg /d. ~  10/10: pt reports that she's not taking the Januvia due to $$, but can fill it now since deduct is met! ~  labs 5/11 ?on 3 meds? showed BS= 95, A1c=  7.7... rec to get on diet, take meds regularly. ~  labs 2/12 on 3 meds showed BS= 127, A1c= 7.5.Marland KitchenMarland Kitchen she is happy w/ this result & doesn't want more med. ~  She saw DrDolan for optometry 3/12 & no retinopathy seen... ~  Labs 9/12 showed BS= 129, A1c= 7.7, and we will refer pt to Endocrine/DM in light of her worsening A1c on 3 max oral meds (also refer to [facility] per request). ~  DM now followed by DrEllison monthly w/ med adjustments==> his med lists reviewed but appeared incomplete (ie Metform & Glimep not listed at their last OV)... ~  Labs 3/13 on Metform2Bid+Actos45+Januv100 showed BS=131, A1c=7.9.Marland KitchenMarland Kitchen rec to restart the Glimep she ran out of & f/u w/ DrEllison. ~  6/13:  She saw Optometry, DrDolan> no retinopathy... ~  Labs 9/13 on Metform2Bid+Januv100+Glimep2/d, Parlodel2.5/d (not taking) showed BS= 173, A1c= 7.8; plans per endocrine.  GERD (ICD-530.81) - on DEXILANT 60mg /d... Note: Omep40 didn't work for her. ~  GI eval 9/09 by DrStark- EGD showed gastritis, GERD;   ~  AbdSonar 9/09 showed fatty liver, cyst in left hep lobe, bilat renal cysts & stones.  ~  Hx mild elev LFTs likely due to fatty liver disease... ~  Labs 3/13 showed normal LFTs w/ SGOT=25, SGPT=34 ~  Labs 9/13 showed SGOT= 32, SGPT= 44  DIVERTICULOSIS OF COLON (ICD-562.10) COLONIC POLYPS (ICD-211.3) ~  last colonoscopy was 11/07 showing divertics and 4mm polyp (adenomatous)... f/u planned 30yrs. ~  9/12:  We will send reminder to DrStark for colonoscopy due... ~  12/12:  DrStark sent letter to pt regarding colonoscopy due... ~  3/13:  We  reviewed the need for f/u screening colonoscopy & she is encouraged to call & set this up... ~  5/13:  She saw DrStark & has a follow up colonoscopy scheduled>   RENAL CYST (ICD-593.2) - followed by ZOXWRUEA... AbdSonar 9/09 showed bilat renal cysts and stones in kidneys.  VITAMIN D DEFICIENCY (ICD-268.9) - supposed to be taking Vit D OTC supplement 2000 u daily. ~  BMD here 9/08 showed TScores -0.3 in Spine, and -0.4 in left FemNeck ~  labs 6/10 showed Vit D level = 15 and she was started on OTC Vit D 2000u daily. ~  BMD here 6/11 was normal w/ TScores -0.5 in Spine, and -0.3 in FemNecks... rec to continue Calcium, MVI, Vit D. ~  labs 2/12 showed Vit D level = 19 and she is reminded to take supplement daily. ~  Labs 9/12 showed Vit D level = 21... rec to switch to 50000u supplement Rx weekly. ~  Labs 3/13 showed Vit D level = 23... ?if taking the 50K weekly, rec to stay on this!  ANXIETY (ICD-300.00) - prev used Alprazolam 0.5mg  Prn but it is no longer on her med list. SOMATIC DYSFUNCTION (ICD-739.9) - hx mult somatic complaints...  Health Maintenance >> ~  GYN: followed by DrMarshall's office for PAP smears etc... ~  Mammograms at [facility] were OK according to the pt... ~  Immunizations:  she had Flu shot 2010 at work... ?if they gave her a Pneumonia shot... ?last Tetanus vaccine. ~  She declines the 2012 Flu vaccine here, encouraged to get it at work...   Past Surgical History  Procedure Date  . Tubal ligation   . Cardiac electrophysiology mapping and ablation   . Colonoscopy     Outpatient Encounter Prescriptions as of 09/29/2011  Medication Sig Dispense Refill  . albuterol (PROAIR HFA) 108 (90 BASE)  MCG/ACT inhaler Inhale 2 puffs into the lungs every 6 (six) hours as needed.  1 Inhaler  11  . aspirin 81 MG tablet Take 81 mg by mouth daily.      . bromocriptine (PARLODEL) 2.5 MG tablet Take 1 tablet (2.5 mg total) by mouth daily.  30 tablet  11  . cetirizine (ZYRTEC) 10 MG tablet  Take 10 mg by mouth daily as needed.        Marland Kitchen dexlansoprazole (DEXILANT) 60 MG capsule Take 1 capsule (60 mg total) by mouth daily.  30 capsule  3  . dextromethorphan (DELSYM) 30 MG/5ML liquid Take 60 mg by mouth as needed.        . Fluticasone-Salmeterol (ADVAIR DISKUS) 100-50 MCG/DOSE AEPB Inhale 1 puff into the lungs every 12 (twelve) hours.        Marland Kitchen glimepiride (AMARYL) 1 MG tablet Take 2 mg by mouth daily before breakfast.       . Glucose Blood (ACCU-CHEK AVIVA VI) Check blood sugar daily       . glucose blood (ONE TOUCH ULTRA TEST) test strip Use as directed to check blood sugar 250.00  100 each  10  . JANUVIA 100 MG tablet TAKE 1 TABLET BY MOUTH DAILY AT DINNER  90 tablet  3  . metFORMIN (GLUCOPHAGE) 500 MG tablet TAKE 2 TABLETS TWICE A DAY WITH BREAKFAST AND DINNER  360 tablet  3  . mometasone (NASONEX) 50 MCG/ACT nasal spray 2 sprays by Nasal route at bedtime as needed.        . Multiple Vitamins-Minerals (MULTIVITAMIN WITH MINERALS) tablet Take 1 tablet by mouth daily.        Marland Kitchen omeprazole (PRILOSEC) 40 MG capsule Take 1 capsule (40 mg total) by mouth daily.  30 capsule  11  . peg 3350 powder (MOVIPREP) 100 G SOLR Moviprep as directed, no substitutions  1 kit  0  . rosuvastatin (CRESTOR) 10 MG tablet Take 1 tablet (10 mg total) by mouth at bedtime.  30 tablet  11  . Vitamin D, Ergocalciferol, (DRISDOL) 50000 UNITS CAPS Take 1 capsule (50,000 Units total) by mouth every 7 (seven) days.  4 capsule  11    Allergies  Allergen Reactions  . Actos (Pioglitazone)     tremor  . Penicillins     REACTION: rash and sob  . Sulfa Antibiotics Hives    Current Medications, Allergies, Past Medical History, Past Surgical History, Family History, and Social History were reviewed in [employer] record.    Review of Systems        See HPI - all other systems neg except as noted... The patient denies anorexia, fever, weight loss, weight gain, vision loss, decreased hearing,  hoarseness, chest pain, syncope, dyspnea on exertion, peripheral edema, prolonged cough, headaches, hemoptysis, abdominal pain, melena, hematochezia, severe indigestion/heartburn, hematuria, incontinence, muscle weakness, suspicious skin lesions, transient blindness, difficulty walking, depression, unusual weight change, abnormal bleeding, enlarged lymph nodes, and angioedema.     Objective:   Physical Exam    WD, WN, 56 y/o BF in NAD...   GENERAL:  Alert & oriented; pleasant & cooperative... HEENT:  [place]/AT, EOM-wnl,  EACs-clear, TMs-wnl, NOSE-clear, THROAT-clear & wnl. NECK:  Supple w/ full ROM; no JVD; normal carotid impulses w/o bruits; no thyromegaly or nodules palpated; no lymphadenopathy. CHEST:  Clear to P & A; without wheezes/ rales/ or rhonchi heard... HEART:  Regular Rhythm; without murmurs/ rubs/ or gallops. ABDOMEN:  Soft & nontender; normal bowel sounds; no  organomegaly or masses detected, neg CVA tenderness.  EXT: without deformities, mild arthritic changes; no varicose veins/ venous insuffic/ or edema. NEURO:  CN's intact; no focal neuro deficits... DERM:  No lesions noted; no rash etc...  RADIOLOGY DATA:  Reviewed in the EPIC EMR & discussed w/ the patient...  LABORATORY DATA:  Reviewed in the EPIC EMR & discussed w/ the patient...   Assessment & Plan:    AR & Asthma> on Advair100 but only using this Prn & encouraged to use it twice daily every day (plus Proair HFA as needed); she uses Zyrtek & Nasonex for her nasal allergies...     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     CHOL> now on CRES10 & FLP improved, continue same, better diet...     DM> on mult meds from DrEllison, her DM specialist; she has had freq adjustments in meds & A1c=8.2 Jun2013 & 7.8 now; she states intol to Parlodel he tried last & will f/u w/ him regularly...     GERD> on Dexilant60mg /d & followed by DrStark (Omep doesn't work for her)...       Divertics, Polyps> she had 4mm adenomatous polyp removed 11/07 & 70yr f/u colon due soon, she received letter from DrStark but has yet to set up her f/u colon...     Elev LFTs> mild incr LFTs, likely FLD (per sonar 2009), she weighs 162# (no change) & we discussed diet, exercise, get wt down...     Vit D Defic> on VitD 1000u OTC daily supplement; but VitD level has only improved to 23 & we will switch to VitD 50K weekly, reminded to take it every week.     Anxiety> she has mult somatic complaints, prev used alprazolam as needed but it is no longer on her list...   Patient's Medications  New Prescriptions   No medications on file  Previous Medications   ASPIRIN 81 MG TABLET    Take 81 mg by mouth daily.   BROMOCRIPTINE (PARLODEL) 2.5 MG TABLET    Take 1 tablet (2.5 mg total) by mouth daily.   CETIRIZINE (ZYRTEC) 10 MG TABLET    Take 10 mg by mouth daily as needed.     DEXLANSOPRAZOLE (DEXILANT) 60 MG CAPSULE    Take 1 capsule (60 mg total) by mouth daily.   DEXTROMETHORPHAN (DELSYM) 30 MG/5ML LIQUID    Take 60 mg by mouth as needed.     GLIMEPIRIDE (AMARYL) 1 MG TABLET    Take 2 mg by mouth daily before breakfast.    GLUCOSE BLOOD (ACCU-CHEK AVIVA VI)    Check blood sugar daily    GLUCOSE BLOOD (ONE TOUCH ULTRA TEST) TEST STRIP    Use as directed to check blood sugar 250.00   JANUVIA 100 MG TABLET    TAKE 1 TABLET BY MOUTH DAILY AT DINNER   METFORMIN (GLUCOPHAGE) 500 MG TABLET    TAKE 2 TABLETS TWICE A DAY WITH BREAKFAST AND DINNER   MOMETASONE (NASONEX) 50 MCG/ACT NASAL SPRAY    2 sprays by Nasal route at bedtime as needed.     MULTIPLE VITAMINS-MINERALS (MULTIVITAMIN WITH MINERALS) TABLET    Take 1 tablet by mouth daily.     PEG 3350 POWDER (MOVIPREP) 100 G SOLR    Moviprep as directed, no substitutions   ROSUVASTATIN (CRESTOR) 10 MG TABLET    Take 1 tablet (10 mg total) by mouth at bedtime.   VITAMIN D, ERGOCALCIFEROL, (  DRISDOL) 50000 UNITS CAPS    Take 1 capsule (50,000 Units total) by  mouth every 7 (seven) days.  Modified Medications   Modified Medication Previous Medication   ALBUTEROL (PROAIR HFA) 108 (90 BASE) MCG/ACT INHALER albuterol (PROAIR HFA) 108 (90 BASE) MCG/ACT inhaler      Inhale 2 puffs into the lungs every 6 (six) hours as needed.    Inhale 2 puffs into the lungs every 6 (six) hours as needed.   FLUTICASONE-SALMETEROL (ADVAIR DISKUS) 100-50 MCG/DOSE AEPB Fluticasone-Salmeterol (ADVAIR DISKUS) 100-50 MCG/DOSE AEPB      Inhale 1 puff into the lungs every 12 (twelve) hours.    Inhale 1 puff into the lungs every 12 (twelve) hours.    Discontinued Medications   OMEPRAZOLE (PRILOSEC) 40 MG CAPSULE    Take 1 capsule (40 mg total) by mouth daily.

## 2011-09-29 NOTE — Patient Instructions (Addendum)
Today we updated your med list in our EPIC system...    Continue your current medications the same...    We refilled the meds you requested...  Please return to our lab one morning next week for your FASTING blood work...    We will call you w/ the results when avail...  Keep up the great job w/ diet & exercise...  Call for any questions...  Let's plan another follow up visit in about 6 months.Marland KitchenMarland Kitchen

## 2011-09-30 ENCOUNTER — Encounter: Payer: Self-pay | Admitting: Pulmonary Disease

## 2011-10-05 ENCOUNTER — Encounter: Payer: Self-pay | Admitting: Endocrinology

## 2011-10-05 ENCOUNTER — Ambulatory Visit (INDEPENDENT_AMBULATORY_CARE_PROVIDER_SITE_OTHER): Payer: Self-pay | Admitting: Endocrinology

## 2011-10-05 ENCOUNTER — Other Ambulatory Visit (INDEPENDENT_AMBULATORY_CARE_PROVIDER_SITE_OTHER): Payer: Self-pay

## 2011-10-05 VITALS — BP 150/86 | HR 88 | Temp 97.0°F | Wt 159.0 lb

## 2011-10-05 DIAGNOSIS — I456 Pre-excitation syndrome: Secondary | ICD-10-CM

## 2011-10-05 DIAGNOSIS — E119 Type 2 diabetes mellitus without complications: Secondary | ICD-10-CM

## 2011-10-05 DIAGNOSIS — E7800 Pure hypercholesterolemia, unspecified: Secondary | ICD-10-CM

## 2011-10-05 LAB — BASIC METABOLIC PANEL
Calcium: 8.9 mg/dL (ref 8.4–10.5)
Chloride: 104 mEq/L (ref 96–112)
Creatinine, Ser: 0.7 mg/dL (ref 0.4–1.2)
Sodium: 140 mEq/L (ref 135–145)

## 2011-10-05 LAB — LIPID PANEL
LDL Cholesterol: 104 mg/dL — ABNORMAL HIGH (ref 0–99)
Total CHOL/HDL Ratio: 4
Triglycerides: 102 mg/dL (ref 0.0–149.0)

## 2011-10-05 LAB — HEPATIC FUNCTION PANEL
ALT: 44 U/L — ABNORMAL HIGH (ref 0–35)
Alkaline Phosphatase: 81 U/L (ref 39–117)
Bilirubin, Direct: 0.1 mg/dL (ref 0.0–0.3)
Total Bilirubin: 0.7 mg/dL (ref 0.3–1.2)

## 2011-10-05 MED ORDER — GLIMEPIRIDE 4 MG PO TABS: 4.0000 mg | ORAL_TABLET | Freq: Every day | ORAL | Status: DC

## 2011-10-05 NOTE — Progress Notes (Signed)
Subjective:    Patient ID: Lindsay Henderson, female    DOB: [birth year], 56 y.o.   MRN: [id number]  HPI Pt returns for f/u of type 2 DM (dx'ed 2000; no known complications; she did not take parlodel due to cost).  She is off prednisone now.  no cbg record, but states cbg's vary from 150-200.  She has no insurance now.  She has lost weight, due to her efforts.  Past Medical History  Diagnosis Date  . Allergic rhinitis   . Asthma   . Anomalous atrioventricular excitation   . History of supraventricular tachycardia   . Hypercholesteremia   . Type II or unspecified type diabetes mellitus without mention of complication, not stated as uncontrolled   . GERD (gastroesophageal reflux disease)   . Gastritis   . Diverticulosis of colon   . Adenomatous colon polyp 11/2005  . Nephrolithiasis   . Renal cyst   . Vitamin d deficiency   . Somatic dysfunction   . Anxiety     no per pt    Past Surgical History  Procedure Date  . Tubal ligation   . Cardiac electrophysiology mapping and ablation   . Colonoscopy     History   Social History  . Marital Status: Married    Spouse Name: N/A    Number of Children: 4  . Years of Education: N/A   Occupational History  . [employer]    Social History Main Topics  . Smoking status: Never Smoker   . Smokeless tobacco: Never Used  . Alcohol Use: No  . Drug Use: No  . Sexually Active: Not on file   Other Topics Concern  . Not on file   Social History Narrative  . No narrative on file    Current Outpatient Prescriptions on File Prior to Visit  Medication Sig Dispense Refill  . albuterol (PROAIR HFA) 108 (90 BASE) MCG/ACT inhaler Inhale 2 puffs into the lungs every 6 (six) hours as needed.  1 Inhaler  11  . aspirin 81 MG tablet Take 81 mg by mouth daily.      . cetirizine (ZYRTEC) 10 MG tablet Take 10 mg by mouth daily as needed.        Marland Kitchen dexlansoprazole (DEXILANT) 60 MG capsule Take 1 capsule (60 mg total) by mouth daily.  30  capsule  3  . dextromethorphan (DELSYM) 30 MG/5ML liquid Take 60 mg by mouth as needed.        . Fluticasone-Salmeterol (ADVAIR DISKUS) 100-50 MCG/DOSE AEPB Inhale 1 puff into the lungs every 12 (twelve) hours.  60 each  0  . Glucose Blood (ACCU-CHEK AVIVA VI) Check blood sugar daily       . glucose blood (ONE TOUCH ULTRA TEST) test strip Use as directed to check blood sugar 250.00  100 each  10  . linagliptin (TRADJENTA) 5 MG TABS tablet Take 5 mg by mouth daily.      . metFORMIN (GLUCOPHAGE) 500 MG tablet TAKE 2 TABLETS TWICE A DAY WITH BREAKFAST AND DINNER  360 tablet  3  . mometasone (NASONEX) 50 MCG/ACT nasal spray 2 sprays by Nasal route at bedtime as needed.        . Multiple Vitamins-Minerals (MULTIVITAMIN WITH MINERALS) tablet Take 1 tablet by mouth daily.        . peg 3350 powder (MOVIPREP) 100 G SOLR Moviprep as directed, no substitutions  1 kit  0  . rosuvastatin (CRESTOR) 10 MG tablet Take 1 tablet (10  mg total) by mouth at bedtime.  30 tablet  11  . Vitamin D, Ergocalciferol, (DRISDOL) 50000 UNITS CAPS Take 1 capsule (50,000 Units total) by mouth every 7 (seven) days.  4 capsule  11  . DISCONTD: glimepiride (AMARYL) 1 MG tablet Take 2 mg by mouth daily before breakfast.         Allergies  Allergen Reactions  . Actos (Pioglitazone)     tremor  . Penicillins     REACTION: rash and sob  . Sulfa Antibiotics Hives    Family History  Problem Relation Age of Onset  . Diabetes Father   . Hypertension Father   . Hyperlipidemia Father   . Breast cancer Mother   . Colon cancer Neg Hx   . Esophageal cancer Neg Hx   . Stomach cancer Neg Hx   . Rectal cancer Neg Hx     BP 150/86  Pulse 88  Temp 97 F (36.1 C) (Oral)  Wt 159 lb (72.122 kg)  Review of Systems denies hypoglycemia.    Objective:   Physical Exam VITAL SIGNS:  See vs page GENERAL: no distress NECK: There is no palpable thyroid enlargement.  No thyroid nodule is palpable.  No palpable lymphadenopathy at the  anterior neck.      Assessment & Plan:  DM, therapy limited by pt's request for least expensive meds.

## 2011-10-05 NOTE — Patient Instructions (Addendum)
Please increase the glimepiride to 4 mg each morning.   Please come back for a follow-up appointment in 4 months. Change januvia to "tradjenta," because i have samples of this. blood tests are being requested for you today.  You will be contacted with results.

## 2011-10-09 ENCOUNTER — Encounter: Payer: Self-pay | Admitting: Gastroenterology

## 2011-10-10 ENCOUNTER — Telehealth: Payer: Self-pay | Admitting: Endocrinology

## 2011-10-10 NOTE — Telephone Encounter (Signed)
Pt.notified

## 2011-10-10 NOTE — Telephone Encounter (Signed)
Dr. Ellison please advise.  

## 2011-10-10 NOTE — Telephone Encounter (Signed)
Pt states the Dr. Everardo All switched her from [place] to [place] on her last visit, but she isn't sure if she's supposed to take the new rx in the morning or the evening. Please advise.

## 2011-10-10 NOTE — Telephone Encounter (Signed)
5 mg qam

## 2011-10-12 ENCOUNTER — Other Ambulatory Visit: Payer: Self-pay | Admitting: Endocrinology

## 2011-10-13 ENCOUNTER — Telehealth: Payer: Self-pay | Admitting: Gastroenterology

## 2011-10-13 NOTE — Telephone Encounter (Signed)
Samples left out front for patient to pick up. Pt notified.

## 2011-12-18 ENCOUNTER — Other Ambulatory Visit: Payer: Self-pay | Admitting: Pulmonary Disease

## 2011-12-19 ENCOUNTER — Telehealth: Payer: Self-pay | Admitting: Gastroenterology

## 2011-12-19 NOTE — Telephone Encounter (Signed)
Told patient that I will leave Dexilant samples out front for patient to pick up.

## 2011-12-29 ENCOUNTER — Emergency Department (HOSPITAL_COMMUNITY): Payer: Self-pay

## 2011-12-29 ENCOUNTER — Encounter (HOSPITAL_COMMUNITY): Payer: Self-pay | Admitting: Vascular Surgery

## 2011-12-29 ENCOUNTER — Emergency Department (HOSPITAL_COMMUNITY)
Admission: EM | Admit: 2011-12-29 | Discharge: 2011-12-29 | Disposition: A | Discharge status: Home / Self Care | Payer: Self-pay | Attending: Emergency Medicine | Admitting: Emergency Medicine

## 2011-12-29 DIAGNOSIS — R42 Dizziness and giddiness: Secondary | ICD-10-CM | POA: Insufficient documentation

## 2011-12-29 DIAGNOSIS — R61 Generalized hyperhidrosis: Secondary | ICD-10-CM | POA: Insufficient documentation

## 2011-12-29 DIAGNOSIS — E860 Dehydration: Secondary | ICD-10-CM | POA: Insufficient documentation

## 2011-12-29 DIAGNOSIS — Z8659 Personal history of other mental and behavioral disorders: Secondary | ICD-10-CM | POA: Insufficient documentation

## 2011-12-29 DIAGNOSIS — J45909 Unspecified asthma, uncomplicated: Secondary | ICD-10-CM | POA: Insufficient documentation

## 2011-12-29 DIAGNOSIS — Z8719 Personal history of other diseases of the digestive system: Secondary | ICD-10-CM | POA: Insufficient documentation

## 2011-12-29 DIAGNOSIS — Z79899 Other long term (current) drug therapy: Secondary | ICD-10-CM | POA: Insufficient documentation

## 2011-12-29 DIAGNOSIS — I456 Pre-excitation syndrome: Secondary | ICD-10-CM | POA: Insufficient documentation

## 2011-12-29 DIAGNOSIS — Z87442 Personal history of urinary calculi: Secondary | ICD-10-CM | POA: Insufficient documentation

## 2011-12-29 DIAGNOSIS — Z8679 Personal history of other diseases of the circulatory system: Secondary | ICD-10-CM | POA: Insufficient documentation

## 2011-12-29 DIAGNOSIS — Z87448 Personal history of other diseases of urinary system: Secondary | ICD-10-CM | POA: Insufficient documentation

## 2011-12-29 DIAGNOSIS — E119 Type 2 diabetes mellitus without complications: Secondary | ICD-10-CM | POA: Insufficient documentation

## 2011-12-29 DIAGNOSIS — Z860100 Personal history of colon polyps, unspecified: Secondary | ICD-10-CM | POA: Insufficient documentation

## 2011-12-29 DIAGNOSIS — R55 Syncope and collapse: Secondary | ICD-10-CM | POA: Insufficient documentation

## 2011-12-29 DIAGNOSIS — E7800 Pure hypercholesterolemia, unspecified: Secondary | ICD-10-CM | POA: Insufficient documentation

## 2011-12-29 DIAGNOSIS — Z7982 Long term (current) use of aspirin: Secondary | ICD-10-CM | POA: Insufficient documentation

## 2011-12-29 DIAGNOSIS — K219 Gastro-esophageal reflux disease without esophagitis: Secondary | ICD-10-CM | POA: Insufficient documentation

## 2011-12-29 HISTORY — DX: Calculus of kidney: N20.0

## 2011-12-29 LAB — COMPREHENSIVE METABOLIC PANEL
AST: 24 U/L (ref 0–37)
Albumin: 3.8 g/dL (ref 3.5–5.2)
BUN: 13 mg/dL (ref 6–23)
Calcium: 9.5 mg/dL (ref 8.4–10.5)
Chloride: 101 mEq/L (ref 96–112)
Creatinine, Ser: 0.67 mg/dL (ref 0.50–1.10)
Total Bilirubin: 0.2 mg/dL — ABNORMAL LOW (ref 0.3–1.2)

## 2011-12-29 LAB — URINALYSIS, ROUTINE W REFLEX MICROSCOPIC
Glucose, UA: >1000 mg/dL — AB
Leukocytes, UA: NEGATIVE
Protein, ur: NEGATIVE mg/dL
Specific Gravity, Urine: 1.03 (ref 1.005–1.030)
Urobilinogen, UA: 1 mg/dL (ref 0.0–1.0)

## 2011-12-29 LAB — CBC WITH DIFFERENTIAL/PLATELET
Basophils Absolute: 0 10*3/uL (ref 0.0–0.1)
Basophils Relative: 0 % (ref 0–1)
Eosinophils Absolute: 0.5 10*3/uL (ref 0.0–0.7)
Eosinophils Relative: 5 % (ref 0–5)
HCT: 38.5 % (ref 36.0–46.0)
Hemoglobin: 13 g/dL (ref 12.0–15.0)
MCH: 29.4 pg (ref 26.0–34.0)
MCHC: 33.8 g/dL (ref 30.0–36.0)
MCV: 87.1 fL (ref 78.0–100.0)
Monocytes Absolute: 0.7 10*3/uL (ref 0.1–1.0)
Monocytes Relative: 7 % (ref 3–12)
Neutro Abs: 5.9 10*3/uL (ref 1.7–7.7)
RDW: 12.9 % (ref 11.5–15.5)

## 2011-12-29 LAB — URINE MICROSCOPIC-ADD ON

## 2011-12-29 LAB — POCT I-STAT TROPONIN I: Troponin i, poc: 0 ng/mL (ref 0.00–0.08)

## 2011-12-29 MED ORDER — SODIUM CHLORIDE 0.9 % IV BOLUS (SEPSIS)
1000.0000 mL | Freq: Once | INTRAVENOUS | Status: AC
  Administered 2011-12-29: 1000 mL via INTRAVENOUS

## 2011-12-29 NOTE — ED Provider Notes (Signed)
History     CSN: [id number]  Arrival date & time 12/29/11  2100   First MD Initiated Contact with Patient 12/29/11 2110      Chief Complaint  Patient presents with  . Loss of Consciousness    (Consider location/radiation/quality/duration/timing/severity/associated sxs/prior treatment) HPI Comments: 56 year old diabetic female presents to the emergency department via EMS after having a syncopal episode while at dinner. Husband is in the emergency department with her, however he was not with her at dinner, she was with her daughter's. States she was eating weighings and all of a sudden began to not feel well. She got very hot and lightheaded. She went to step outside in the cool and felt a little better, went into her car and the next day she knew her daughters were in there next her. Husband states Dr. told him that he got to the car less than a minute after patient was in the car. There was no fall. Denies ever having an episode like this before. Before dinner patient was feeling perfectly fine. She does admit to drinking a half a glass of water all day. States her diabetes is well-controlled normally by her endocrinologist. CBG via EMS was 207. Denies chest pain, shortness of breath, nausea, vomiting, dizziness. States she does feel little lightheaded when she sits up. She has not been sick recently.  Patient is a 56 y.o. female presenting with syncope. The history is provided by the patient and the spouse.  Loss of Consciousness Associated symptoms include diaphoresis. Pertinent negatives include no chest pain, nausea or vomiting.    Past Medical History  Diagnosis Date  . Allergic rhinitis   . Asthma   . Anomalous atrioventricular excitation   . History of supraventricular tachycardia   . Hypercholesteremia   . Type II or unspecified type diabetes mellitus without mention of complication, not stated as uncontrolled   . GERD (gastroesophageal reflux disease)   . Gastritis   .  Diverticulosis of colon   . Adenomatous colon polyp 11/2005  . Nephrolithiasis   . Renal cyst   . Vitamin D deficiency   . Somatic dysfunction   . Anxiety     no per pt  . Kidney stones     Past Surgical History  Procedure Date  . Tubal ligation   . Cardiac electrophysiology mapping and ablation   . Colonoscopy     Family History  Problem Relation Age of Onset  . Diabetes Father   . Hypertension Father   . Hyperlipidemia Father   . Breast cancer Mother   . Colon cancer Neg Hx   . Esophageal cancer Neg Hx   . Stomach cancer Neg Hx   . Rectal cancer Neg Hx     History  Substance Use Topics  . Smoking status: Never Smoker   . Smokeless tobacco: Never Used  . Alcohol Use: No    OB History    Grav Para Term Preterm Abortions TAB SAB Ect Mult Living                  Review of Systems  Constitutional: Positive for diaphoresis.  HENT: Negative.   Eyes: Negative for visual disturbance.  Respiratory: Negative for shortness of breath.   Cardiovascular: Positive for syncope. Negative for chest pain and palpitations.  Gastrointestinal: Negative for nausea and vomiting.  Genitourinary: Negative.   Musculoskeletal: Negative.   Skin: Negative for pallor.  Neurological: Positive for syncope and light-headedness.  Psychiatric/Behavioral: Negative for confusion.  Allergies  Actos; Peanut-containing drug products; Penicillins; and Sulfa antibiotics  Home Medications   Current Outpatient Rx  Name  Route  Sig  Dispense  Refill  . ALBUTEROL SULFATE HFA 108 (90 BASE) MCG/ACT IN AERS   Inhalation   Inhale 2 puffs into the lungs every 6 (six) hours as needed.   1 Inhaler   11   . ASPIRIN 81 MG PO TABS   Oral   Take 81 mg by mouth daily.         Marland Kitchen CETIRIZINE HCL 10 MG PO TABS   Oral   Take 10 mg by mouth daily as needed. For allergies         . DEXLANSOPRAZOLE 60 MG PO CPDR   Oral   Take 1 capsule (60 mg total) by mouth daily.   30 capsule   3   .  FLUTICASONE-SALMETEROL 100-50 MCG/DOSE IN AEPB   Inhalation   Inhale 1 puff into the lungs every 12 (twelve) hours.   60 each   0   . GLIMEPIRIDE 4 MG PO TABS   Oral   Take 1 tablet (4 mg total) by mouth daily before breakfast.   30 tablet   3   . ACCU-CHEK AVIVA VI      Check blood sugar daily          . GLUCOSE BLOOD VI STRP      Use as directed to check blood sugar 250.00   100 each   10   . LINAGLIPTIN 5 MG PO TABS   Oral   Take 5 mg by mouth daily.         Marland Kitchen METFORMIN HCL 500 MG PO TABS   Oral   Take 1,000 mg by mouth 2 (two) times daily with a meal.         . MOMETASONE FUROATE 50 MCG/ACT NA SUSP   Nasal   2 sprays by Nasal route at bedtime as needed.           . MULTI-VITAMIN/MINERALS PO TABS   Oral   Take 1 tablet by mouth daily.           . ONETOUCH ULTRA BLUE VI STRP      CHECK BLOOD GLUCOSE ONCE DAILY   100 each   11   . ROSUVASTATIN CALCIUM 10 MG PO TABS   Oral   Take 1 tablet (10 mg total) by mouth at bedtime.   30 tablet   11   . VITAMIN D (ERGOCALCIFEROL) 50000 UNITS PO CAPS   Oral   Take 50,000 Units by mouth every 7 (seven) days. On sundays           BP 143/71  Pulse 97  Temp 98.5 F (36.9 C) (Oral)  Resp 18  SpO2 98%  Physical Exam  Nursing note and vitals reviewed. Constitutional: She is oriented to person, place, and time. She appears well-developed and well-nourished. No distress.  HENT:  Head: Normocephalic and atraumatic.  Mouth/Throat: Oropharynx is clear and moist.  Eyes: Conjunctivae normal and EOM are normal. Pupils are equal, round, and reactive to light. No scleral icterus.  Neck: Normal range of motion. Neck supple.  Cardiovascular: Normal rate, regular rhythm, normal heart sounds and intact distal pulses.   Pulmonary/Chest: Effort normal and breath sounds normal.  Abdominal: Soft. Bowel sounds are normal. There is no tenderness.  Musculoskeletal: Normal range of motion. She exhibits no edema.   Neurological: She is alert and oriented to person,  place, and time.  Skin: Skin is warm and dry.  Psychiatric: She has a normal mood and affect. Her behavior is normal.    ED Course  Procedures (including critical care time)  Labs Reviewed  COMPREHENSIVE METABOLIC PANEL - Abnormal; Notable for the following:    Glucose, Bld 198 (*)     Total Bilirubin 0.2 (*)     All other components within normal limits  URINALYSIS, ROUTINE W REFLEX MICROSCOPIC - Abnormal; Notable for the following:    Glucose, UA >1000 (*)     All other components within normal limits  URINE MICROSCOPIC-ADD ON - Abnormal; Notable for the following:    Squamous Epithelial / LPF MANY (*)     Bacteria, UA FEW (*)     All other components within normal limits  CBC WITH DIFFERENTIAL  POCT I-STAT TROPONIN I  URINE CULTURE   Dg Chest 2 View  12/29/2011  *RADIOLOGY REPORT*  Clinical Data: Loss of consciousness, weakness and high blood sugar  CHEST - 2 VIEW  Comparison: 09/10  Findings: The cardiac silhouette is normal in size and configuration.  The mediastinum is normal in overall contour caliber.  The lungs are clear.  The bony thorax is demineralized but intact.  IMPRESSION: No active disease of the chest.   Original Report Authenticated By: Amie Portland, M.D.     Date: 12/29/2011  Rate: 93  Rhythm: normal sinus rhythm  QRS Axis: right  Intervals: normal  ST/T Wave abnormalities: normal  Conduction Disutrbances:none  Narrative Interpretation: borderline low voltage in frontal leads, borderline RAD, no stemi  Old EKG Reviewed: unchanged    1. Dehydration   2. Syncope       MDM  56 y/o female with syncopal episode.  Patient feeling much better after receiving fluids. Only drank 1/2 glass of water all day. Physical exam unremarkable. Glucose 198. Labs, EKG and CXR otherwise unremarkable. Explained importance of staying well hydrated with patient and husband who both state their understanding. Patient was  dehydrated. She will f/u with her PCP. Stable for discharge. Case discussed with Dr. Manus Gunning who agrees with plan of care.        Trevor Mace, PA-C 12/29/11 2251

## 2011-12-29 NOTE — ED Notes (Addendum)
Pt was at [facility] and had not eaten and felt hot and weak and went out to the car and per family the pt slumped over and passed out. Pt has no hx of hypertension or cardiac problems. 12 lead unremarkable per EMS. Pt was alert and oriented upon arrival but did not remember walking out of the restaurant and getting into the car. Pt is alert and oriented at this time and only complains of weakness and nausea. CBG 207 mg/dl and hypertensive upon on EMS arrival. Denies any CP and SOB. But reports lightheadedness upon standing. Denies nausea.

## 2011-12-29 NOTE — ED Provider Notes (Signed)
Medical screening examination/treatment/procedure(s) were performed by non-physician practitioner and as supervising physician I was immediately available for consultation/collaboration.   Glynn Octave, MD 12/29/11 [phone]

## 2011-12-31 LAB — URINE CULTURE: Colony Count: 75000

## 2012-02-01 ENCOUNTER — Ambulatory Visit: Payer: Self-pay | Admitting: Endocrinology

## 2012-02-08 ENCOUNTER — Ambulatory Visit: Payer: Self-pay | Admitting: Endocrinology

## 2012-02-15 ENCOUNTER — Encounter: Payer: Self-pay | Admitting: Endocrinology

## 2012-02-15 ENCOUNTER — Ambulatory Visit (INDEPENDENT_AMBULATORY_CARE_PROVIDER_SITE_OTHER): Payer: BC Managed Care – PPO | Admitting: Endocrinology

## 2012-02-15 VITALS — BP 118/70 | HR 100 | Wt 160.0 lb

## 2012-02-15 DIAGNOSIS — E119 Type 2 diabetes mellitus without complications: Secondary | ICD-10-CM

## 2012-02-15 LAB — HEMOGLOBIN A1C: Hgb A1c MFr Bld: 7.3 % — ABNORMAL HIGH (ref 4.6–6.5)

## 2012-02-15 MED ORDER — GLIMEPIRIDE 1 MG PO TABS: 1.0000 mg | ORAL_TABLET | Freq: Every day | ORAL | Status: DC

## 2012-02-15 MED ORDER — BROMOCRIPTINE MESYLATE 2.5 MG PO TABS: 2.5000 mg | ORAL_TABLET | Freq: Every day | ORAL | Status: DC

## 2012-02-15 NOTE — Patient Instructions (Addendum)
Please reduce the glimepiride to 1 mg each morning.   Please come back for a follow-up appointment in 3 months. Please start taking "bromocriptine," to help your blood sugar. It has possible side effects of nausea and dizziness.  These go away with time.  You can avoid these by taking it at bedtime, and by taking just take 1/2 pill for the first week.   blood tests are being requested for you today.  You will be contacted with results.

## 2012-02-15 NOTE — Progress Notes (Signed)
Subjective:    Patient ID: Lindsay Henderson, female    DOB: 1955-07-03, 57 y.o.   MRN: [id number]  HPI Pt returns for f/u of type 2 DM (dx'ed 2000; no known complications; she did not take parlodel due to cost).  no cbg record, but states cbg's vary from 150-200.  She has regained her health insurance.  pt states she feels well in general, except for intermittent hypoglycemia.   Past Medical History  Diagnosis Date  . Allergic rhinitis   . Asthma   . Anomalous atrioventricular excitation   . History of supraventricular tachycardia   . Hypercholesteremia   . Type II or unspecified type diabetes mellitus without mention of complication, not stated as uncontrolled   . GERD (gastroesophageal reflux disease)   . Gastritis   . Diverticulosis of colon   . Adenomatous colon polyp 11/2005  . Nephrolithiasis   . Renal cyst   . Vitamin D deficiency   . Somatic dysfunction   . Anxiety     no per pt  . Kidney stones     Past Surgical History  Procedure Date  . Tubal ligation   . Cardiac electrophysiology mapping and ablation   . Colonoscopy     History   Social History  . Marital Status: Married    Spouse Name: N/A    Number of Children: 4  . Years of Education: N/A   Occupational History  . [employer]    Social History Main Topics  . Smoking status: Never Smoker   . Smokeless tobacco: Never Used  . Alcohol Use: No  . Drug Use: No  . Sexually Active: Not on file   Other Topics Concern  . Not on file   Social History Narrative  . No narrative on file    Current Outpatient Prescriptions on File Prior to Visit  Medication Sig Dispense Refill  . albuterol (PROAIR HFA) 108 (90 BASE) MCG/ACT inhaler Inhale 2 puffs into the lungs every 6 (six) hours as needed.  1 Inhaler  11  . aspirin 81 MG tablet Take 81 mg by mouth daily.      . cetirizine (ZYRTEC) 10 MG tablet Take 10 mg by mouth daily as needed. For allergies      . dexlansoprazole (DEXILANT) 60 MG capsule  Take 1 capsule (60 mg total) by mouth daily.  30 capsule  3  . Fluticasone-Salmeterol (ADVAIR DISKUS) 100-50 MCG/DOSE AEPB Inhale 1 puff into the lungs every 12 (twelve) hours.  60 each  0  . Glucose Blood (ACCU-CHEK AVIVA VI) Check blood sugar daily       . glucose blood (ONE TOUCH ULTRA TEST) test strip Use as directed to check blood sugar 250.00  100 each  10  . linagliptin (TRADJENTA) 5 MG TABS tablet Take 5 mg by mouth daily.      . metFORMIN (GLUCOPHAGE) 500 MG tablet Take 1,000 mg by mouth 2 (two) times daily with a meal.      . mometasone (NASONEX) 50 MCG/ACT nasal spray 2 sprays by Nasal route at bedtime as needed.        . Multiple Vitamins-Minerals (MULTIVITAMIN WITH MINERALS) tablet Take 1 tablet by mouth daily.        . ONE TOUCH ULTRA TEST test strip CHECK BLOOD GLUCOSE ONCE DAILY  100 each  11  . rosuvastatin (CRESTOR) 10 MG tablet Take 1 tablet (10 mg total) by mouth at bedtime.  30 tablet  11  . Vitamin D, Ergocalciferol, (  DRISDOL) 50000 UNITS CAPS Take 50,000 Units by mouth every 7 (seven) days. On sundays        Allergies  Allergen Reactions  . Actos (Pioglitazone)     tremor  . Peanut-Containing Drug Products Hives, Itching and Swelling  . Penicillins     REACTION: rash and sob  . Sulfa Antibiotics Hives    Family History  Problem Relation Age of Onset  . Diabetes Father   . Hypertension Father   . Hyperlipidemia Father   . Breast cancer Mother   . Colon cancer Neg Hx   . Esophageal cancer Neg Hx   . Stomach cancer Neg Hx   . Rectal cancer Neg Hx     BP 118/70  Pulse 100  Wt 160 lb (72.576 kg)  SpO2 97%  Review of Systems Denies severe hypoglycemia    Objective:   Physical Exam Pulses: dorsalis pedis intact bilat.   Feet: no deformity.  no ulcer on the feet.  feet are of normal color and temp.  no edema Neuro: sensation is intact to touch on the feet   Lab Results  Component Value Date   HGBA1C 7.3* 02/15/2012      Assessment & Plan:  DM: she  needs increased rx, if it can be done with a regimen that avoids or minimizes hypoglycemia.

## 2012-02-19 ENCOUNTER — Other Ambulatory Visit: Payer: Self-pay | Admitting: Endocrinology

## 2012-02-29 ENCOUNTER — Telehealth: Payer: Self-pay | Admitting: Gastroenterology

## 2012-02-29 NOTE — Telephone Encounter (Signed)
Patient wants samples of Dexilant. Samples left up front for patient to pick up and pt notified.

## 2012-03-04 ENCOUNTER — Other Ambulatory Visit: Payer: Self-pay | Admitting: Endocrinology

## 2012-05-10 ENCOUNTER — Telehealth: Payer: Self-pay

## 2012-05-10 NOTE — Telephone Encounter (Signed)
Patient walked in today to get samples of Dexilant. Told patient we did not have very many samples of Dexilant left but I gave patient 4 boxes of samples. Patient states she will call back and schedule an office visit.

## 2012-05-16 ENCOUNTER — Ambulatory Visit: Payer: BC Managed Care – PPO | Admitting: Endocrinology

## 2012-05-23 ENCOUNTER — Ambulatory Visit (INDEPENDENT_AMBULATORY_CARE_PROVIDER_SITE_OTHER): Payer: BC Managed Care – PPO | Admitting: Endocrinology

## 2012-05-23 ENCOUNTER — Encounter: Payer: Self-pay | Admitting: Endocrinology

## 2012-05-23 VITALS — BP 126/74 | HR 112 | Ht 63.0 in | Wt 158.0 lb

## 2012-05-23 DIAGNOSIS — E119 Type 2 diabetes mellitus without complications: Secondary | ICD-10-CM

## 2012-05-23 MED ORDER — NATEGLINIDE 120 MG PO TABS: 120.0000 mg | ORAL_TABLET | Freq: Three times a day (TID) | ORAL | Status: DC

## 2012-05-23 NOTE — Patient Instructions (Addendum)
Please stay at just 1/2 of bromocriptine at bedtime  Please come back for a follow-up appointment in 3 months.  blood tests are being requested for you today.  You will be contacted with results. Based on the results, we may be able to change the glimepiride to "nateglinide."  check your blood sugar once a day.  vary the time of day when you check, between before the 3 meals, and at bedtime.  also check if you have symptoms of your blood sugar being too high or too low.  please keep a record of the readings and bring it to your next appointment here.  please call us sooner if your blood sugar goes below 70, or if you have a lot of readings over 200.

## 2012-05-23 NOTE — Progress Notes (Signed)
Subjective:    Patient ID: Lindsay Henderson, female    DOB: 1955-04-26, 57 y.o.   MRN: [id number]  HPI Pt returns for f/u of type 2 DM (dx'ed 2000; no known complications; she has never had severe hypoglycemia or DKA).  no cbg record, but states cbg's are in the mid-100's. She tolerated 1/2 tab of parlodel, but not 1 tab. Past Medical History  Diagnosis Date  . Allergic rhinitis   . Asthma   . Anomalous atrioventricular excitation   . History of supraventricular tachycardia   . Hypercholesteremia   . Type II or unspecified type diabetes mellitus without mention of complication, not stated as uncontrolled   . GERD (gastroesophageal reflux disease)   . Gastritis   . Diverticulosis of colon   . Adenomatous colon polyp 11/2005  . Nephrolithiasis   . Renal cyst   . Vitamin D deficiency   . Somatic dysfunction   . Anxiety     no per pt  . Kidney stones     Past Surgical History  Procedure Laterality Date  . Tubal ligation    . Cardiac electrophysiology mapping and ablation    . Colonoscopy      History   Social History  . Marital Status: Married    Spouse Name: N/A    Number of Children: 4  . Years of Education: N/A   Occupational History  . [employer]    Social History Main Topics  . Smoking status: Never Smoker   . Smokeless tobacco: Never Used  . Alcohol Use: No  . Drug Use: No  . Sexually Active: Not on file   Other Topics Concern  . Not on file   Social History Narrative  . No narrative on file    Current Outpatient Prescriptions on File Prior to Visit  Medication Sig Dispense Refill  . albuterol (PROAIR HFA) 108 (90 BASE) MCG/ACT inhaler Inhale 2 puffs into the lungs every 6 (six) hours as needed.  1 Inhaler  11  . aspirin 81 MG tablet Take 81 mg by mouth daily.      . cetirizine (ZYRTEC) 10 MG tablet Take 10 mg by mouth daily as needed. For allergies      . Fluticasone-Salmeterol (ADVAIR DISKUS) 100-50 MCG/DOSE AEPB Inhale 1 puff into the lungs  every 12 (twelve) hours.  60 each  0  . Glucose Blood (ACCU-CHEK AVIVA VI) Check blood sugar daily       . glucose blood (ONE TOUCH ULTRA TEST) test strip Use as directed to check blood sugar 250.00  100 each  10  . linagliptin (TRADJENTA) 5 MG TABS tablet Take 5 mg by mouth daily.      . metFORMIN (GLUCOPHAGE) 500 MG tablet TAKE TWO TABLETS BY MOUTH TWICE DAILY WITH  BREAKFAST  AND  DINNER  960 tablet  0  . mometasone (NASONEX) 50 MCG/ACT nasal spray 2 sprays by Nasal route at bedtime as needed.        . Multiple Vitamins-Minerals (MULTIVITAMIN WITH MINERALS) tablet Take 1 tablet by mouth daily.        . ONE TOUCH ULTRA TEST test strip CHECK BLOOD GLUCOSE ONCE DAILY  100 each  11  . Vitamin D, Ergocalciferol, (DRISDOL) 50000 UNITS CAPS Take 50,000 Units by mouth every 7 (seven) days. On sundays      . dexlansoprazole (DEXILANT) 60 MG capsule Take 1 capsule (60 mg total) by mouth daily.  30 capsule  3   No current facility-administered medications  on file prior to visit.    Allergies  Allergen Reactions  . Actos (Pioglitazone)     tremor  . Peanut-Containing Drug Products Hives, Itching and Swelling  . Penicillins     REACTION: rash and sob  . Sulfa Antibiotics Hives    Family History  Problem Relation Age of Onset  . Diabetes Father   . Hypertension Father   . Hyperlipidemia Father   . Breast cancer Mother   . Colon cancer Neg Hx   . Esophageal cancer Neg Hx   . Stomach cancer Neg Hx   . Rectal cancer Neg Hx     BP 126/74  Pulse 112  Ht 5\' 3"  (1.6 m)  Wt 158 lb (71.668 kg)  BMI 28 kg/m2  SpO2 98%  Review of Systems denies hypoglycemia    Objective:   Physical Exam VITAL SIGNS:  See vs page GENERAL: no distress  Lab Results  Component Value Date   HGBA1C 7.7* 05/23/2012      Assessment & Plan:  DM: Needs increased rx, if it can be done with a regimen that avoids or minimizes hypoglycemia.

## 2012-08-08 ENCOUNTER — Telehealth: Payer: Self-pay | Admitting: Gastroenterology

## 2012-08-08 DIAGNOSIS — K219 Gastro-esophageal reflux disease without esophagitis: Secondary | ICD-10-CM

## 2012-08-08 MED ORDER — DEXLANSOPRAZOLE 60 MG PO CPDR: 60.0000 mg | DELAYED_RELEASE_CAPSULE | Freq: Every day | ORAL | Status: DC

## 2012-08-08 NOTE — Addendum Note (Signed)
Addended by: Jessee Avers on: 08/08/2012 10:34 AM   Modules accepted: Orders

## 2012-08-08 NOTE — Telephone Encounter (Signed)
Patient states she has tried the omeprazole we gave her last year but is not helping her reflux symptoms. She also has tried over the counter Nexium which also did not help with reflux. She would like some Dexilant samples because Dexilant is too expensive to get as a prescription which is what she took in the past. Told patient we only carry very few samples of Dexilant and will give her a few boxes but she is also over due for a follow up visit. Patient scheduled an appt for 09/05/12 at 9:15am. Three boxes of Dexilant samples given let up front for patient to pick up and told patient we cannot give her any more samples or a prescription until she comes in for an appt. Pt verbalized understanding.

## 2012-08-22 ENCOUNTER — Ambulatory Visit (INDEPENDENT_AMBULATORY_CARE_PROVIDER_SITE_OTHER): Payer: BC Managed Care – PPO | Admitting: Endocrinology

## 2012-08-22 ENCOUNTER — Encounter: Payer: Self-pay | Admitting: Endocrinology

## 2012-08-22 VITALS — BP 142/80 | HR 80 | Ht 62.0 in | Wt 155.0 lb

## 2012-08-22 DIAGNOSIS — E119 Type 2 diabetes mellitus without complications: Secondary | ICD-10-CM

## 2012-08-22 MED ORDER — PIOGLITAZONE HCL 45 MG PO TABS: 45.0000 mg | ORAL_TABLET | Freq: Every day | ORAL | Status: DC

## 2012-08-22 NOTE — Progress Notes (Signed)
Subjective:    Patient ID: Lindsay Henderson, female    DOB: [birth year], 57 y.o.   MRN: [id number]  HPI Pt returns for f/u of type 2 DM (dx'ed 2000; she has mild if any neuropathy of the lower extremities.no known associated complications; she has never had severe hypoglycemia or DKA; she takes multiple oral agents).  no cbg record, but states cbg's vary from 78-200.  There is no trend throughout the day.  pt states she feels well in general.  However, she could not tolerate even 1/2 of parlodel. Past Medical History  Diagnosis Date  . Allergic rhinitis   . Asthma   . Anomalous atrioventricular excitation   . History of supraventricular tachycardia   . Hypercholesteremia   . Type II or unspecified type diabetes mellitus without mention of complication, not stated as uncontrolled   . GERD (gastroesophageal reflux disease)   . Gastritis   . Diverticulosis of colon   . Adenomatous colon polyp 11/2005  . Nephrolithiasis   . Renal cyst   . Vitamin D deficiency   . Somatic dysfunction   . Anxiety     no per pt  . Kidney stones     Past Surgical History  Procedure Laterality Date  . Tubal ligation    . Cardiac electrophysiology mapping and ablation    . Colonoscopy      History   Social History  . Marital Status: Married    Spouse Name: N/A    Number of Children: 4  . Years of Education: N/A   Occupational History  . [employer]    Social History Main Topics  . Smoking status: Never Smoker   . Smokeless tobacco: Never Used  . Alcohol Use: No  . Drug Use: No  . Sexual Activity: Not on file   Other Topics Concern  . Not on file   Social History Narrative  . No narrative on file    Current Outpatient Prescriptions on File Prior to Visit  Medication Sig Dispense Refill  . albuterol (PROAIR HFA) 108 (90 BASE) MCG/ACT inhaler Inhale 2 puffs into the lungs every 6 (six) hours as needed.  1 Inhaler  11  . aspirin 81 MG tablet Take 81 mg by mouth daily.      .  cetirizine (ZYRTEC) 10 MG tablet Take 10 mg by mouth daily as needed. For allergies      . dexlansoprazole (DEXILANT) 60 MG capsule Take 1 capsule (60 mg total) by mouth daily.  15 capsule  0  . Fluticasone-Salmeterol (ADVAIR DISKUS) 100-50 MCG/DOSE AEPB Inhale 1 puff into the lungs every 12 (twelve) hours.  60 each  0  . Glucose Blood (ACCU-CHEK AVIVA VI) Check blood sugar daily       . glucose blood (ONE TOUCH ULTRA TEST) test strip Use as directed to check blood sugar 250.00  100 each  10  . linagliptin (TRADJENTA) 5 MG TABS tablet Take 5 mg by mouth daily.      . metFORMIN (GLUCOPHAGE) 500 MG tablet TAKE TWO TABLETS BY MOUTH TWICE DAILY WITH  BREAKFAST  AND  DINNER  960 tablet  0  . mometasone (NASONEX) 50 MCG/ACT nasal spray 2 sprays by Nasal route at bedtime as needed.        . Multiple Vitamins-Minerals (MULTIVITAMIN WITH MINERALS) tablet Take 1 tablet by mouth daily.        . nateglinide (STARLIX) 120 MG tablet Take 1 tablet (120 mg total) by mouth 3 (three) times  daily before meals.  90 tablet  11  . ONE TOUCH ULTRA TEST test strip CHECK BLOOD GLUCOSE ONCE DAILY  100 each  11  . Vitamin D, Ergocalciferol, (DRISDOL) 50000 UNITS CAPS Take 50,000 Units by mouth every 7 (seven) days. On sundays       No current facility-administered medications on file prior to visit.    Allergies  Allergen Reactions  . Actos [Pioglitazone]     tremor  . Peanut-Containing Drug Products Hives, Itching and Swelling  . Penicillins     REACTION: rash and sob  . Sulfa Antibiotics Hives    Family History  Problem Relation Age of Onset  . Diabetes Father   . Hypertension Father   . Hyperlipidemia Father   . Breast cancer Mother   . Colon cancer Neg Hx   . Esophageal cancer Neg Hx   . Stomach cancer Neg Hx   . Rectal cancer Neg Hx     BP 142/80  Pulse 80  Ht 5\' 2"  (1.575 m)  Wt 155 lb (70.308 kg)  BMI 28.34 kg/m2  SpO2 97%    Review of Systems denies hypoglycemia and weight change.     Objective:   Physical Exam VITAL SIGNS:  See vs page.  GENERAL: no distress.  Lab Results  Component Value Date   HGBA1C 7.6* 08/22/2012      Assessment & Plan:  DM: Needs increased rx, if it can be done with a regimen that avoids or minimizes hypoglycemia.  We discussed the nine oral agents available for type 2 diabetes.  This regimen gives the best risk-benefit ratio.   Nausea, due to parlodel.  This limits rx options. Headache, due to actos, in the past.  However, pt says this may have a coincidence, so she wants to try again.

## 2012-08-22 NOTE — Patient Instructions (Addendum)
Please come back for a follow-up appointment in 3 months.  blood tests are being requested for you today.  We'll contact you with results.   If it is high, we'll add "actos."   check your blood sugar once a day.  vary the time of day when you check, between before the 3 meals, and at bedtime.  also check if you have symptoms of your blood sugar being too high or too low.  please keep a record of the readings and bring it to your next appointment here.  please call us sooner if your blood sugar goes below 70, or if you have a lot of readings over 200.

## 2012-08-23 ENCOUNTER — Telehealth: Payer: Self-pay | Admitting: Pulmonary Disease

## 2012-08-23 DIAGNOSIS — F411 Generalized anxiety disorder: Secondary | ICD-10-CM

## 2012-08-23 DIAGNOSIS — E7800 Pure hypercholesterolemia, unspecified: Secondary | ICD-10-CM

## 2012-08-23 DIAGNOSIS — E559 Vitamin D deficiency, unspecified: Secondary | ICD-10-CM

## 2012-08-23 DIAGNOSIS — E119 Type 2 diabetes mellitus without complications: Secondary | ICD-10-CM

## 2012-08-23 NOTE — Telephone Encounter (Signed)
Pt aware. Nothing further needed 

## 2012-08-23 NOTE — Telephone Encounter (Signed)
Per SN: FLP, BMET, LIVER, CBC DIFF, TSH, VIT D THANKS

## 2012-08-23 NOTE — Telephone Encounter (Signed)
Pt last OV 09/29/2011 Pt pending CPX is 08/29/12. Please advise SN thanks

## 2012-08-26 ENCOUNTER — Telehealth: Payer: Self-pay | Admitting: Endocrinology

## 2012-08-26 ENCOUNTER — Ambulatory Visit (INDEPENDENT_AMBULATORY_CARE_PROVIDER_SITE_OTHER): Payer: BC Managed Care – PPO

## 2012-08-26 DIAGNOSIS — E119 Type 2 diabetes mellitus without complications: Secondary | ICD-10-CM

## 2012-08-26 DIAGNOSIS — E7800 Pure hypercholesterolemia, unspecified: Secondary | ICD-10-CM

## 2012-08-26 DIAGNOSIS — F411 Generalized anxiety disorder: Secondary | ICD-10-CM

## 2012-08-26 DIAGNOSIS — E559 Vitamin D deficiency, unspecified: Secondary | ICD-10-CM

## 2012-08-26 LAB — CBC WITH DIFFERENTIAL/PLATELET
Basophils Relative: 0.3 % (ref 0.0–3.0)
Eosinophils Relative: 6.2 % — ABNORMAL HIGH (ref 0.0–5.0)
HCT: 37.9 % (ref 36.0–46.0)
Hemoglobin: 12.9 g/dL (ref 12.0–15.0)
Lymphs Abs: 2.1 10*3/uL (ref 0.7–4.0)
MCV: 86.6 fl (ref 78.0–100.0)
Monocytes Absolute: 0.6 10*3/uL (ref 0.1–1.0)
Monocytes Relative: 6.5 % (ref 3.0–12.0)
Neutro Abs: 5.3 10*3/uL (ref 1.4–7.7)
Platelets: 223 10*3/uL (ref 150.0–400.0)
RBC: 4.38 Mil/uL (ref 3.87–5.11)
WBC: 8.6 10*3/uL (ref 4.5–10.5)

## 2012-08-26 LAB — LIPID PANEL
Cholesterol: 210 mg/dL — ABNORMAL HIGH (ref 0–200)
HDL: 42.3 mg/dL (ref >39.00)
Total CHOL/HDL Ratio: 5
Triglycerides: 136 mg/dL (ref 0.0–149.0)
VLDL: 27.2 mg/dL (ref 0.0–40.0)

## 2012-08-26 LAB — BASIC METABOLIC PANEL
BUN: 10 mg/dL (ref 6–23)
Calcium: 8.8 mg/dL (ref 8.4–10.5)
GFR: 127.14 mL/min (ref >60.00)
Glucose, Bld: 159 mg/dL — ABNORMAL HIGH (ref 70–99)
Potassium: 3.7 mEq/L (ref 3.5–5.1)

## 2012-08-26 LAB — HEPATIC FUNCTION PANEL
AST: 22 U/L (ref 0–37)
Albumin: 3.9 g/dL (ref 3.5–5.2)
Total Bilirubin: 0.3 mg/dL (ref 0.3–1.2)

## 2012-08-26 NOTE — Telephone Encounter (Signed)
Pt states she was given samples on thursday of "Jentadueto 2.5/500, but she already takes Metformin, please advise?

## 2012-08-26 NOTE — Telephone Encounter (Signed)
Pt advised.

## 2012-08-26 NOTE — Telephone Encounter (Signed)
Yes, i gave you these to save you money.  When you take the "jentadueto," reduce your metformin that day by the amount in the jentadueto.

## 2012-08-29 ENCOUNTER — Encounter: Payer: Self-pay | Admitting: Pulmonary Disease

## 2012-08-29 ENCOUNTER — Ambulatory Visit (INDEPENDENT_AMBULATORY_CARE_PROVIDER_SITE_OTHER): Payer: Self-pay | Admitting: Pulmonary Disease

## 2012-08-29 VITALS — BP 132/72 | HR 84 | Temp 98.1°F | Ht 62.0 in | Wt 158.8 lb

## 2012-08-29 DIAGNOSIS — F411 Generalized anxiety disorder: Secondary | ICD-10-CM

## 2012-08-29 DIAGNOSIS — J45909 Unspecified asthma, uncomplicated: Secondary | ICD-10-CM

## 2012-08-29 DIAGNOSIS — E559 Vitamin D deficiency, unspecified: Secondary | ICD-10-CM

## 2012-08-29 DIAGNOSIS — E119 Type 2 diabetes mellitus without complications: Secondary | ICD-10-CM

## 2012-08-29 DIAGNOSIS — K5730 Diverticulosis of large intestine without perforation or abscess without bleeding: Secondary | ICD-10-CM

## 2012-08-29 DIAGNOSIS — Z0000 Encounter for general adult medical examination without abnormal findings: Secondary | ICD-10-CM

## 2012-08-29 DIAGNOSIS — K219 Gastro-esophageal reflux disease without esophagitis: Secondary | ICD-10-CM

## 2012-08-29 DIAGNOSIS — Z86010 Personal history of colonic polyps: Secondary | ICD-10-CM

## 2012-08-29 DIAGNOSIS — J309 Allergic rhinitis, unspecified: Secondary | ICD-10-CM

## 2012-08-29 DIAGNOSIS — I498 Other specified cardiac arrhythmias: Secondary | ICD-10-CM

## 2012-08-29 DIAGNOSIS — E7800 Pure hypercholesterolemia, unspecified: Secondary | ICD-10-CM

## 2012-08-29 MED ORDER — ALBUTEROL SULFATE HFA 108 (90 BASE) MCG/ACT IN AERS: 2.0000 | INHALATION_SPRAY | Freq: Four times a day (QID) | RESPIRATORY_TRACT | Status: DC | PRN

## 2012-08-29 MED ORDER — ROSUVASTATIN CALCIUM 10 MG PO TABS: 10.0000 mg | ORAL_TABLET | Freq: Every day | ORAL | Status: DC

## 2012-08-29 MED ORDER — BECLOMETHASONE DIPROPIONATE 80 MCG/ACT IN AERS: 1.0000 | INHALATION_SPRAY | Freq: Two times a day (BID) | RESPIRATORY_TRACT | Status: DC

## 2012-08-29 MED ORDER — MOMETASONE FUROATE 50 MCG/ACT NA SUSP: 2.0000 | Freq: Every evening | NASAL | Status: DC | PRN

## 2012-08-29 NOTE — Patient Instructions (Addendum)
Today we updated your med list in our EPIC system...    Continue your current medications the same...  We wrote prescriptions for the new QVar, Crestor, and your Proair/ Nasonex/ etc...  Plan to drop by our lab in November- to have your lipid panel done while on the Crestor10mg ...  We reviewed your recent FASTING blood work and gave you a copy...  Continue your regular check ups w/ DrEllison...  Call for any questions...  Let's plan a follow up visit in 29yr, sooner if needed for problems.Marland KitchenMarland Kitchen

## 2012-08-29 NOTE — Progress Notes (Signed)
Subjective:    Patient ID: Lindsay Henderson, female    DOB: [birth year], 57 y.o.   MRN: [id number]  HPI 57 y/o BF here for a follow up visit... she has multiple medical problems including hx Asthma, hx WPW, Chol, DM, GERD/ Divertics/ Polyps, Vit D defic, Anxiety/ Somatization, & hx poor compliance w/ med rx...  ~  September 29, 2010:  51mo ROV & she requests refills for all meds 90d supplies; she declines to receive the 2012 Flu vaccine...    AR & Asthma> on Advair100 but only using this Prn she says (plus Proair HFA as needed); she uses Zyrtek & Nasonex for her nasal allergies; denies breathing problems or asthma exac...    Hx WPW & SVT> followed by DrTaylor w/ hx catheter ablation in 2004; she denies palpit, dizzy, syncope, etc...    CHOL> she has been recommended to take statin therapy but she declines preferring diet alone despite her poor numbers...    DM> on Metformin500-2Bid, Glimep4mg /d, Januvia100mg /d; plus DM diet efforts; today BS=129, A1c=7.7, and she wants to avoid insulin therefore refer to Endocrine/DM; also wants [facility] consult.    GERD> on Dexilant60mg /d & followed by DrStark (Omep doesn't work for her)...    Divertics, Polyps> she had 4mm adenomatous polyp removed 11/07 & 2yr f/u colon due soon, we will send memo to GI/ DrStark...    Elev LFTs> mild incr LFTs, likely FLD (per sonar 2009), she weighs 162# (no change) & we discussed diet, exercise, get wt down...    Vit D Defic> on VitD 1000u OTC daily supplement; but VitD level has only improved to 21 & we will switch to VitD 50K weekly...    Anxiety> she has mult somatic complaints, prev used alprazolam as needed but it is no longer on her list...  ~  March 29, 2011:  57mo ROV & she has seen DrEllison monthly for DM control & has been to the [facility] center;  His notes indicate that she has been inconsistent w/ meds- eg. ran out of Glimep in Feb & A1c is up to 7.9; she indicates that he wanted to change Januvia to  Victoza...  She reports that her breathing is stable on Advair/ Proventil... See prob list below>> LABS 3/13:  FLP- no better, not at goals on diet alone;  Chems- ok x BS=131, A1c=7.9;  CBC- wnl;  TSH=1.27;  VitD=23;  UA=clear  ~  September 29, 2011:  57mo ROV & [name] continues to complain of sinus issues- pressure, drainage, discomfort; on Zyrtek, Nasonex, Advair, Delsym and we discussed Rx w/ alt antihist, Saline, Nasonex, etc...     She continues to follow up w/ DrEllison for DM> last A1c=8.2 in Jun2013; meds adjust & placed on Parlodel2.5mg - "it's a drug that controls everything but it didn't work for me, I felt jittery, etc"; she has upcoming appt & labs 9/13 showed BS=173, A1c=7.8 (sl improved); plans per DrEllison...    We reviewed prob list, meds, xrays and labs> see below for updates >> LABS 9/13:  FLP- at goals x LDL=104 on Cres10;  Chems ok x BS=173 A1c=7.8 on Metform/ Januv/ Amaryl...   ~  August 29, 2012:  70mo ROV & CPX> [name] has had a good yr, followed regularly every 52mo by DrEllison, her CC is her allergies... We reviewed the following medical problems during today's office visit >>     AR & Asthma> prev on Advair100 but she prefers daughter's QVar80- 2spBid (plus Proair HFA as needed); she  uses Zyrtek & Nasonex for her nasal allergies; denies breathing problems or asthma exac...    Hx WPW & SVT> prev on ASA81 but it causes heartburn therefore stopped; followed by DrTaylor w/ hx catheter ablation in 2004; she denies palpit, dizzy, syncope, etc...    CHOL> she nevere filled Cres10 last yr; on diet alone & FLP 8/14 shows TChol 210, TG 136, HDL 42, LDL 153; Rec- start Cres10 daily & f/u FLP 85mo...    DM> on Metformin500-2Bid, Tradjenta5, Starlix120Tid, Actos45; plus DM diet efforts; Labs 8/14 show BS=159, A1c=7.6, and she wants to avoid insulin; has seen [facility], continue w/ DrEllison...    GERD> on Dexilant60mg /d & followed by DrStark w/ EGD in 2009- GERD, gastritis; (Note- Omep  doesn't work for her & GI gives her Dexilant samples)...    Divertics, Polyps> she had 4mm adenomatous polyp removed 11/07 & 34yr f/u colon is overdue, she cancelled Colon in 2013, has f/u appt w/ GI soon...    Elev LFTs> mild incr LFTs in the past, likely FLD (per sonar 2009), she weighs 159# (down 3#) & LFTs remain norm at this time; we discussed diet, exercise, get wt down...    Vit D Defic> on VitD 50K weekly; Labs show VitD level = 42 & rec to continue same...    Anxiety> she has mult somatic complaints, prev used Alprazolam as needed but it is no longer on her list... We reviewed prob list, meds, xrays and labs> see below for updates >>  LABS 8/14:  FLP- not at goals on diet alone LDL=153;  Chems- ok x BS=159 A1c=7.6;  CBC- wnl;  TSH=1.22;  VitD=42...           Problem List:      ALLERGIC RHINITIS (ICD-477.9) - controlled on Zyrtek, Nasonex, & Saline...  Note: ENT eval in past was neg.  ASTHMA (ICD-493.90) - controlled on ADVAIR 100 Bid (but only using it Prn now), & PROAIR Prn... uses Mucinex Prn as well. 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 ~  8/14: prev on Advair100 but she prefers daughter's QVar80- 2spBid (plus Proair HFA as needed); she uses Zyrtek & Nasonex for her nasal allergies; denies breathing problems or asthma exac.  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 ~  Myoview 7/06 was normal w/o ischemia, norm wall motion, EF=93%...  ~  2DEcho 7/06 w/ norm LVF... ~  EKG 6/10 shows NSR, rate 90, NAD... ~  She has intermittent mild palpit, none recently...   HYPERCHOLESTEROLEMIA (ICD-272.0) - prev on Lipitor20 & she tolerated it well, but everytime she starts it- she stops it again w/o explanation (once because her husb had a reaction to it); she declines other statins, declines Lipid Clinic referral,  wants to do diet alone. ~  FLP 9/08 on Lip20 showed TChol 140, TG 89, HDL 37, LDL 85 ~  FLP 6/09 on Lip20 showed TChol 138, TG 55, HDL 35, LDL 92... rec incr exercise, same med. ~  FLP 6/10 ?on Lip20 showed TChol 181, TG 121, HDL 41, LDL 116... rec> take meds regularly! ~  FLP 5/11 off med showed TChol 220, TG 143, HDL 47, LDL 165... rec> restart Lip20 every day! ~  FLP 2/12 on diet alone showed TChol 216, TG 116, HDL 46, LDL 164... she refuses meds, & will do the best she  can on diet alone. ~  FLP 3/13 on diet alone showed TChol 215, TG 80, HDL 58, LDL 150... She agrees to try CRESTOR10mg /d. ~  FLP 9/13 on Cres10 showed TChol 169. TG 102, HDL 45, LDL 104... Improved, continue same, better diet. ~  FLP 8/14 off Cres on diet alone showed TChol 210, TG 136, HDL 42, LDL 153... Rec to restart the Cres10.  DIABETES MELLITUS, TYPE II (ICD-250.00) - now followed by DrEllison w/ freq med changes (his notes are reviewed)... ~  labs 9/08 on GlucovQam showed BS= 122, HgA1c= 7.4... she was told to incr Glucovance Bid- but didn't do it! ~  labs 6/09 showed BS= 166, HgA1c= 8.2.Marland KitchenMarland Kitchen rec to incr the Glucovance to Bid!!! ~  labs 6/10 showed BS= 161, A1c= 8.1, Microalb= neg... decided to change Rx to Metformin 1000Bid, Glimepiride 4mg /d, Januvia 100mg /d. ~  10/10: pt reports that she's not taking the Januvia due to $$, but can fill it now since deduct is met! ~  labs 5/11 ?on 3 meds? showed BS= 95, A1c= 7.7.Marland KitchenMarland Kitchen rec to get on diet, take meds regularly. ~  labs 2/12 on 3 meds showed BS= 127, A1c= 7.5.Marland KitchenMarland Kitchen she is happy w/ this result & doesn't want more med. ~  She saw DrDolan for optometry 3/12 & no retinopathy seen... ~  Labs 9/12 showed BS= 129, A1c= 7.7, and we will refer pt to Endocrine/DM in light of her worsening A1c on 3 max oral meds (also refer to [facility] per request). ~  DM now followed by DrEllison monthly w/ med adjustments==> his med lists reviewed but appeared incomplete (ie Metform & Glimep not  listed at their last OV)... ~  Labs 3/13 on Metform2Bid+Actos45+Januv100 showed BS=131, A1c=7.9.Marland KitchenMarland Kitchen rec to restart the Glimep she ran out of & f/u w/ DrEllison. ~  6/13:  She saw Optometry, DrDolan> no retinopathy... ~  Labs 9/13 on Metform2Bid+Januv100+Glimep2/d, Parlodel2.5/d (not taking) showed BS= 173, A1c= 7.8; plans per endocrine. ~  8/14: on Metformin500-2Bid, Tradjenta5, Starlix120Tid, Actos45; plus DM diet efforts; Labs 8/14 show BS=159, A1c=7.6, and she wants to avoid insulin; has seen [facility], continue w/ DrEllison.  GERD (ICD-530.81) - on DEXILANT 60mg /d... Note: Omep40 didn't work for her. ~  GI eval 9/09 by DrStark- EGD showed gastritis, GERD;   ~  AbdSonar 9/09 showed fatty liver, cyst in left hep lobe, bilat renal cysts & stones.  ~  Hx mild elev LFTs likely due to fatty liver disease... ~  Labs 3/13 showed normal LFTs w/ SGOT=25, SGPT=34 ~  Labs 9/13 showed SGOT= 32, SGPT= 44 ~  8/14: she notes that Prilosec & Nexium don't work for her, needs Dexilant but only using it when she gets samples; Labs show SGOT=22, SGPT=24 w/ wt reduction...  DIVERTICULOSIS OF COLON (ICD-562.10) COLONIC POLYPS (ICD-211.3) ~  last colonoscopy was 11/07 showing divertics and 4mm polyp (adenomatous)... f/u planned 67yrs. ~  9/12:  We will send reminder to DrStark for colonoscopy due... ~  12/12:  DrStark sent letter to pt regarding colonoscopy due... ~  3/13:  We reviewed the need for f/u screening colonoscopy & she is encouraged to call & set this up... ~  5/13:  She saw DrStark & has a follow up colonoscopy scheduled> she cancelled due to the copay required... ~  8/14: she has f/u appt w/ DrStark soon to discuss...  RENAL CYST (ICD-593.2) - followed by ZOXWRUEA... AbdSonar 9/09 showed bilat renal cysts and stones in kidneys.  VITAMIN D DEFICIENCY (ICD-268.9) - supposed  to be taking Vit D OTC supplement 2000 u daily. ~  BMD here 9/08 showed TScores -0.3 in Spine, and -0.4 in left FemNeck ~   labs 6/10 showed Vit D level = 15 and she was started on OTC Vit D 2000u daily. ~  BMD here 6/11 was normal w/ TScores -0.5 in Spine, and -0.3 in FemNecks... rec to continue Calcium, MVI, Vit D. ~  labs 2/12 showed Vit D level = 19 and she is reminded to take supplement daily. ~  Labs 9/12 showed Vit D level = 21... rec to switch to 50000u supplement Rx weekly. ~  Labs 3/13 showed Vit D level = 23... ?if taking the 50K weekly, rec to stay on this! ~  Labs 8/14 showed Vit D level = 42... Continue the 50K weekly.  ANXIETY (ICD-300.00) - prev used Alprazolam 0.5mg  Prn but it is no longer on her med list. SOMATIC DYSFUNCTION (ICD-739.9) - hx mult somatic complaints...  Health Maintenance >> ~  GYN: followed by DrMarshall's office for PAP smears etc... ~  Mammograms at [facility] were OK according to the pt... ~  Immunizations:  she had Flu shot 2010 at work... ?if they gave her a Pneumonia shot... ?last Tetanus vaccine. ~  She declines the 2012 Flu vaccine here, encouraged to get it at work...   Past Surgical History  Procedure Laterality Date  . Tubal ligation    . Cardiac electrophysiology mapping and ablation    . Colonoscopy      Outpatient Encounter Prescriptions as of 08/29/2012  Medication Sig Dispense Refill  . albuterol (PROAIR HFA) 108 (90 BASE) MCG/ACT inhaler Inhale 2 puffs into the lungs every 6 (six) hours as needed.  1 Inhaler  11  . cetirizine (ZYRTEC) 10 MG tablet Take 10 mg by mouth daily as needed. For allergies      . dexlansoprazole (DEXILANT) 60 MG capsule Take 1 capsule (60 mg total) by mouth daily.  15 capsule  0  . Fluticasone-Salmeterol (ADVAIR DISKUS) 100-50 MCG/DOSE AEPB Inhale 1 puff into the lungs every 12 (twelve) hours.  60 each  0  . linagliptin (TRADJENTA) 5 MG TABS tablet Take 5 mg by mouth daily.      . metFORMIN (GLUCOPHAGE) 500 MG tablet TAKE TWO TABLETS BY MOUTH TWICE DAILY WITH  BREAKFAST  AND  DINNER  960 tablet  0  . mometasone (NASONEX) 50 MCG/ACT nasal  spray Place 2 sprays into the nose at bedtime as needed.  17 g  11  . nateglinide (STARLIX) 120 MG tablet Take 1 tablet (120 mg total) by mouth 3 (three) times daily before meals.  90 tablet  11  . pioglitazone (ACTOS) 45 MG tablet Take 1 tablet (45 mg total) by mouth daily.  30 tablet  11  . Vitamin D, Ergocalciferol, (DRISDOL) 50000 UNITS CAPS Take 50,000 Units by mouth every 7 (seven) days. On sundays      . [DISCONTINUED] albuterol (PROAIR HFA) 108 (90 BASE) MCG/ACT inhaler Inhale 2 puffs into the lungs every 6 (six) hours as needed.  1 Inhaler  11  . [DISCONTINUED] mometasone (NASONEX) 50 MCG/ACT nasal spray 2 sprays by Nasal route at bedtime as needed.        . [DISCONTINUED] rosuvastatin (CRESTOR) 10 MG tablet Take 10 mg by mouth daily.      Marland Kitchen aspirin 81 MG tablet Take 81 mg by mouth daily.      . beclomethasone (QVAR) 80 MCG/ACT inhaler Inhale 1 puff into the lungs  2 (two) times daily.  1 Inhaler  6  . rosuvastatin (CRESTOR) 10 MG tablet Take 1 tablet (10 mg total) by mouth daily.  30 tablet  11  . [DISCONTINUED] Glucose Blood (ACCU-CHEK AVIVA VI) Check blood sugar daily       . [DISCONTINUED] glucose blood (ONE TOUCH ULTRA TEST) test strip Use as directed to check blood sugar 250.00  100 each  10  . [DISCONTINUED] Multiple Vitamins-Minerals (MULTIVITAMIN WITH MINERALS) tablet Take 1 tablet by mouth daily.        . [DISCONTINUED] ONE TOUCH ULTRA TEST test strip CHECK BLOOD GLUCOSE ONCE DAILY  100 each  11   No facility-administered encounter medications on file as of 08/29/2012.    Allergies  Allergen Reactions  . Actos [Pioglitazone]     tremor  . Peanut-Containing Drug Products Hives, Itching and Swelling  . Penicillins     REACTION: rash and sob  . Sulfa Antibiotics Hives    Current Medications, Allergies, Past Medical History, Past Surgical History, Family History, and Social History were reviewed in [employer] record.    Review of Systems         See HPI - all other systems neg except as noted... The patient denies anorexia, fever, weight loss, weight gain, vision loss, decreased hearing, hoarseness, chest pain, syncope, dyspnea on exertion, peripheral edema, prolonged cough, headaches, hemoptysis, abdominal pain, melena, hematochezia, severe indigestion/heartburn, hematuria, incontinence, muscle weakness, suspicious skin lesions, transient blindness, difficulty walking, depression, unusual weight change, abnormal bleeding, enlarged lymph nodes, and angioedema.     Objective:   Physical Exam    WD, WN, 57 y/o BF in NAD...   GENERAL:  Alert & oriented; pleasant & cooperative... HEENT:  [place]/AT, EOM-wnl,  EACs-clear, TMs-wnl, NOSE-clear, THROAT-clear & wnl. NECK:  Supple w/ full ROM; no JVD; normal carotid impulses w/o bruits; no thyromegaly or nodules palpated; no lymphadenopathy. CHEST:  Clear to P & A; without wheezes/ rales/ or rhonchi heard... HEART:  Regular Rhythm; without murmurs/ rubs/ or gallops. ABDOMEN:  Soft & nontender; normal bowel sounds; no organomegaly or masses detected, neg CVA tenderness.  EXT: without deformities, mild arthritic changes; no varicose veins/ venous insuffic/ or edema. NEURO:  CN's intact; no focal neuro deficits... DERM:  No lesions noted; no rash etc...  RADIOLOGY DATA:  Reviewed in the EPIC EMR & discussed w/ the patient...  LABORATORY DATA:  Reviewed in the EPIC EMR & discussed w/ the patient...   Assessment & Plan:    AR & Asthma> she uses Zyrtek & Nasonex for her nasal allergies; prefers daughter's QVar & Proair for her Asthma- ok...     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     CHOL> she stopped Cres10 & FLP on diet alone w/ LDL ~150; rec to restart this med...     DM> on mult meds from DrEllison, her DM specialist; she has had freq adjustments in meds & A1c=7.6 now on her 4 med regimen, she still wants to avoid insulin...     GERD>  on Dexilant60mg /d & followed by DrStark (Omep doesn't work for her)...     Divertics, Polyps> she had 4mm adenomatous polyp removed 11/07 & 4yr f/u colon is overdue but she cancelled last yr due to copay required; she has appt to discuss w/ DrStark...     Elev LFTs> mild incr LFTs in past, likely FLD (per sonar 2009), she weighs 159# now & LFTs  remain normal...     Vit D Defic> on VitD 50K weekly & Level= 42, continue same...     Anxiety> she has mult somatic complaints, prev used alprazolam as needed but it is no longer on her list...   Patient's Medications  New Prescriptions   BECLOMETHASONE (QVAR) 80 MCG/ACT INHALER    Inhale 1 puff into the lungs 2 (two) times daily.   ROSUVASTATIN (CRESTOR) 10 MG TABLET    Take 1 tablet (10 mg total) by mouth daily.  Previous Medications   ASPIRIN 81 MG TABLET    Take 81 mg by mouth daily.   CETIRIZINE (ZYRTEC) 10 MG TABLET    Take 10 mg by mouth daily as needed. For allergies   DEXLANSOPRAZOLE (DEXILANT) 60 MG CAPSULE    Take 1 capsule (60 mg total) by mouth daily.   FLUTICASONE-SALMETEROL (ADVAIR DISKUS) 100-50 MCG/DOSE AEPB    Inhale 1 puff into the lungs every 12 (twelve) hours.   LINAGLIPTIN (TRADJENTA) 5 MG TABS TABLET    Take 5 mg by mouth daily.   METFORMIN (GLUCOPHAGE) 500 MG TABLET    TAKE TWO TABLETS BY MOUTH TWICE DAILY WITH  BREAKFAST  AND  DINNER   NATEGLINIDE (STARLIX) 120 MG TABLET    Take 1 tablet (120 mg total) by mouth 3 (three) times daily before meals.   PIOGLITAZONE (ACTOS) 45 MG TABLET    Take 1 tablet (45 mg total) by mouth daily.   VITAMIN D, ERGOCALCIFEROL, (DRISDOL) 50000 UNITS CAPS    Take 50,000 Units by mouth every 7 (seven) days. On sundays  Modified Medications   Modified Medication Previous Medication   ALBUTEROL (PROAIR HFA) 108 (90 BASE) MCG/ACT INHALER albuterol (PROAIR HFA) 108 (90 BASE) MCG/ACT inhaler      Inhale 2 puffs into the lungs every 6 (six) hours as needed.    Inhale 2 puffs into the lungs every 6  (six) hours as needed.   MOMETASONE (NASONEX) 50 MCG/ACT NASAL SPRAY mometasone (NASONEX) 50 MCG/ACT nasal spray      Place 2 sprays into the nose at bedtime as needed.    2 sprays by Nasal route at bedtime as needed.    Discontinued Medications   GLUCOSE BLOOD (ACCU-CHEK AVIVA VI)    Check blood sugar daily    GLUCOSE BLOOD (ONE TOUCH ULTRA TEST) TEST STRIP    Use as directed to check blood sugar 250.00   MULTIPLE VITAMINS-MINERALS (MULTIVITAMIN WITH MINERALS) TABLET    Take 1 tablet by mouth daily.     ONE TOUCH ULTRA TEST TEST STRIP    CHECK BLOOD GLUCOSE ONCE DAILY   ROSUVASTATIN (CRESTOR) 10 MG TABLET    Take 1 tablet (10 mg total) by mouth at bedtime.   ROSUVASTATIN (CRESTOR) 10 MG TABLET    Take 10 mg by mouth daily.

## 2012-09-05 ENCOUNTER — Ambulatory Visit (INDEPENDENT_AMBULATORY_CARE_PROVIDER_SITE_OTHER): Payer: Self-pay | Admitting: Gastroenterology

## 2012-09-05 ENCOUNTER — Encounter: Payer: Self-pay | Admitting: Gastroenterology

## 2012-09-05 VITALS — BP 118/66 | HR 72 | Ht 62.0 in | Wt 156.0 lb

## 2012-09-05 DIAGNOSIS — K219 Gastro-esophageal reflux disease without esophagitis: Secondary | ICD-10-CM

## 2012-09-05 DIAGNOSIS — Z86010 Personal history of colonic polyps: Secondary | ICD-10-CM

## 2012-09-05 NOTE — Patient Instructions (Addendum)
We have provided samples of Dexilant.   You could also take Nexium over the counter 2 tablets by mouth once daily in the place of Dexilant if is too expensive.   You will be due for a recall colonoscopy in 02/2013. We will send you a reminder in the mail when it gets closer to that time.  Thank you for choosing me and [facility].  Venita Lick. Pleas Koch., MD., Clementeen Graham

## 2012-09-05 NOTE — Progress Notes (Signed)
History of Present Illness: This is a 57 year old female with GERD. She is taking Nexium 40 mg daily with control of her symptoms and Dexilant 60 mg daily with control of her symptoms. She is overdue for surveillance colonoscopy and has canceled procedures.  Current Medications, Allergies, Past Medical History, Past Surgical History, Family History and Social History were reviewed in [employer] record.  Physical Exam: General: Well developed , well nourished, no acute distress Head: Normocephalic and atraumatic Eyes:  sclerae anicteric, EOMI Ears: Normal auditory acuity Mouth: No deformity or lesions Lungs: Clear throughout to auscultation Heart: Regular rate and rhythm; no murmurs, rubs or bruits Abdomen: Soft, non tender and non distended. No masses, hepatosplenomegaly or hernias noted. Normal Bowel sounds Musculoskeletal: Symmetrical with no gross deformities  Pulses:  Normal pulses noted Extremities: No clubbing, cyanosis, edema or deformities noted Neurological: Alert oriented x 4, grossly nonfocal Psychological:  Alert and cooperative. Normal mood and affect  Assessment and Recommendations:  1. GERD. Continue Nexium 40 mg daily or Dexilant 60 mg daily. Samples of Dexilant supplied today. She may use Nexium 20 mg OTC 2 every morning or 1 twice a day when her she has used her current Dexilant samples.  2. Personal history of adenomatous colon polyps. Overdue for surveillance colonoscopy. She states she would like to schedule colonoscopy when she has felt insurance and she hopes to have helped insurance early in 2015. We will place a recall reminder for February 2015.

## 2012-11-07 ENCOUNTER — Telehealth: Payer: Self-pay | Admitting: Pulmonary Disease

## 2012-11-07 MED ORDER — AZITHROMYCIN 250 MG PO TABS: ORAL_TABLET | ORAL | Status: DC

## 2012-11-07 MED ORDER — LEVOFLOXACIN 500 MG PO TABS: 500.0000 mg | ORAL_TABLET | Freq: Every day | ORAL | Status: DC

## 2012-11-07 NOTE — Telephone Encounter (Signed)
Per SN---  levaquin is the best, but ok per SN to send in the zpak #1  Take as directed.  i have called the pt and she is aware that this has been sent to the pharmacy.

## 2012-11-07 NOTE — Telephone Encounter (Signed)
Pt calling back [phone]

## 2012-11-07 NOTE — Telephone Encounter (Signed)
Per SN---  levaquin 500 mg  #7  1 daily Algin once daily Nasal saline mist mucinex

## 2012-11-07 NOTE — Telephone Encounter (Signed)
Pt c/o nasal congestion, headaches, sinus drainage (yellow), cough, runny eyes for past several days.  Please advise Allergies  Allergen Reactions  . Actos [Pioglitazone]     tremor  . Peanut-Containing Drug Products Hives, Itching and Swelling  . Penicillins     REACTION: rash and sob  . Sulfa Antibiotics Hives

## 2012-11-07 NOTE — Telephone Encounter (Signed)
Called and spoke with pt and she stated that the levaquin was $117.00.  Pt stated that she is not able to afford this since her insurance has expired.   She is requesting another abx that is generic to be called into the pharmacy.  SN please advise. Thanks  Allergies  Allergen Reactions  . Actos [Pioglitazone]     tremor  . Peanut-Containing Drug Products Hives, Itching and Swelling  . Penicillins     REACTION: rash and sob  . Sulfa Antibiotics Hives

## 2012-11-07 NOTE — Telephone Encounter (Signed)
Returning call.Lindsay Henderson ° °

## 2012-11-07 NOTE — Telephone Encounter (Signed)
Aware of recs per SN Nothing further needed.

## 2012-11-07 NOTE — Telephone Encounter (Signed)
Levaquin 500mg  #7 called into [facility]  Pt needs to be made aware of recs per SN and instructions for OTC meds  LMOM x 1 to rc

## 2012-11-07 NOTE — Telephone Encounter (Signed)
lmomtcb   Allergies  Allergen Reactions  . Actos [Pioglitazone]     tremor  . Peanut-Containing Drug Products Hives, Itching and Swelling  . Penicillins     REACTION: rash and sob  . Sulfa Antibiotics Hives

## 2012-11-14 ENCOUNTER — Other Ambulatory Visit: Payer: Self-pay

## 2012-11-21 ENCOUNTER — Ambulatory Visit: Payer: BC Managed Care – PPO | Admitting: Endocrinology

## 2012-11-28 ENCOUNTER — Ambulatory Visit: Payer: Self-pay | Admitting: Endocrinology

## 2012-12-12 ENCOUNTER — Ambulatory Visit: Payer: Self-pay | Admitting: Endocrinology

## 2012-12-18 ENCOUNTER — Telehealth: Payer: Self-pay | Admitting: *Deleted

## 2012-12-18 NOTE — Telephone Encounter (Signed)
Pt says she has an appt with you in the morning, she wants to know if she can wait until after the 1st of the year to see you as she has no insurance right now.  She states her sugar is okay, a little high in the 200's. She also wants to know if she can pick up more samples of the janumet you gave her last time. Please advise

## 2012-12-18 NOTE — Telephone Encounter (Signed)
We don't have these samples now. Please ome back in January.

## 2012-12-19 ENCOUNTER — Ambulatory Visit: Payer: Self-pay | Admitting: Endocrinology

## 2012-12-20 ENCOUNTER — Encounter: Payer: Self-pay | Admitting: Gastroenterology

## 2012-12-23 ENCOUNTER — Telehealth: Payer: Self-pay | Admitting: Pulmonary Disease

## 2012-12-23 MED ORDER — LEVOFLOXACIN 500 MG PO TABS: 500.0000 mg | ORAL_TABLET | Freq: Every day | ORAL | Status: DC

## 2012-12-23 NOTE — Telephone Encounter (Signed)
Per SN---  levaquin 500 mg  #7  1 daily Align once daily Nasal saline mist  Called and spoke with pt and she is aware of meds sent to her pharmacy.  Nothing further is needed.

## 2012-12-23 NOTE — Telephone Encounter (Signed)
Spoke to pt. Reports sinus congestion and sinus headache. States that when she blows her nose, she gets out green mucus. Wants Levaquin called in. Had Zpack in October but this made her sick.  SN - please advise. Thanks.

## 2012-12-25 ENCOUNTER — Telehealth: Payer: Self-pay | Admitting: *Deleted

## 2012-12-25 MED ORDER — DOXYCYCLINE HYCLATE 100 MG PO TABS: 100.0000 mg | ORAL_TABLET | Freq: Two times a day (BID) | ORAL | Status: DC

## 2012-12-25 NOTE — Telephone Encounter (Signed)
Received form from [facility] stating that the pt was unable to afford the levaquin that was sent in to the pharmacy.  Per SN---can send in the doxycyline 100 mg  #20  1 po bid.  This has been sent to the pharmacy.

## 2013-03-13 ENCOUNTER — Other Ambulatory Visit: Payer: Self-pay | Admitting: Pulmonary Disease

## 2013-03-13 ENCOUNTER — Encounter: Payer: Self-pay | Admitting: Endocrinology

## 2013-03-13 ENCOUNTER — Ambulatory Visit (INDEPENDENT_AMBULATORY_CARE_PROVIDER_SITE_OTHER): Payer: No Typology Code available for payment source | Admitting: Endocrinology

## 2013-03-13 VITALS — BP 110/70 | HR 102 | Temp 98.2°F | Ht 62.0 in | Wt 149.0 lb

## 2013-03-13 DIAGNOSIS — E119 Type 2 diabetes mellitus without complications: Secondary | ICD-10-CM

## 2013-03-13 LAB — HEMOGLOBIN A1C
Hgb A1c MFr Bld: 8.6 % — ABNORMAL HIGH (ref <5.7)
Mean Plasma Glucose: 200 mg/dL — ABNORMAL HIGH (ref <117)

## 2013-03-13 MED ORDER — METFORMIN HCL 500 MG PO TABS: 1000.0000 mg | ORAL_TABLET | Freq: Two times a day (BID) | ORAL | Status: DC

## 2013-03-13 MED ORDER — PIOGLITAZONE HCL 45 MG PO TABS: 45.0000 mg | ORAL_TABLET | Freq: Every day | ORAL | Status: DC

## 2013-03-13 NOTE — Progress Notes (Signed)
Subjective:    Patient ID: Lindsay Henderson, female    DOB: September 21, 1955, 58 y.o.   MRN: [id number]  HPI Pt returns for f/u of type 2 DM (dx'ed 2000; she has mild if any neuropathy of the lower extremities; no known associated complications; she has never had severe hypoglycemia or DKA; she has never taken insulin; she takes 4 oral meds; she did not tolerate parlodel).  Due to a gap in her insurance, she takes only the starlix.  She has regained her insurance.  no cbg record, but states cbg's are in the 200's. Past Medical History  Diagnosis Date  . Allergic rhinitis   . Asthma   . Anomalous atrioventricular excitation   . History of supraventricular tachycardia   . Hypercholesteremia   . Type II or unspecified type diabetes mellitus without mention of complication, not stated as uncontrolled   . GERD (gastroesophageal reflux disease)   . Gastritis   . Diverticulosis of colon   . Adenomatous colon polyp 11/2005  . Nephrolithiasis   . Renal cyst   . Vitamin D deficiency   . Somatic dysfunction   . Anxiety     no per pt  . Kidney stones     Past Surgical History  Procedure Laterality Date  . Tubal ligation    . Cardiac electrophysiology mapping and ablation    . Colonoscopy      History   Social History  . Marital Status: Married    Spouse Name: N/A    Number of Children: 4  . Years of Education: N/A   Occupational History  . [employer]    Social History Main Topics  . Smoking status: Never Smoker   . Smokeless tobacco: Never Used  . Alcohol Use: No  . Drug Use: No  . Sexual Activity: Not on file   Other Topics Concern  . Not on file   Social History Narrative  . No narrative on file    Current Outpatient Prescriptions on File Prior to Visit  Medication Sig Dispense Refill  . albuterol (PROAIR HFA) 108 (90 BASE) MCG/ACT inhaler Inhale 2 puffs into the lungs every 6 (six) hours as needed.  1 Inhaler  11  . aspirin 81 MG tablet Take 81 mg by mouth  daily.      . beclomethasone (QVAR) 80 MCG/ACT inhaler Inhale 1 puff into the lungs 2 (two) times daily.  1 Inhaler  6  . cetirizine (ZYRTEC) 10 MG tablet Take 10 mg by mouth daily as needed. For allergies      . doxycycline (VIBRA-TABS) 100 MG tablet Take 1 tablet (100 mg total) by mouth 2 (two) times daily.  20 tablet  0  . Fluticasone-Salmeterol (ADVAIR DISKUS) 100-50 MCG/DOSE AEPB Inhale 1 puff into the lungs every 12 (twelve) hours.  60 each  0  . linagliptin (TRADJENTA) 5 MG TABS tablet Take 5 mg by mouth daily.      . mometasone (NASONEX) 50 MCG/ACT nasal spray Place 2 sprays into the nose at bedtime as needed.  17 g  11  . nateglinide (STARLIX) 120 MG tablet Take 1 tablet (120 mg total) by mouth 3 (three) times daily before meals.  90 tablet  11  . rosuvastatin (CRESTOR) 10 MG tablet Take 1 tablet (10 mg total) by mouth daily.  30 tablet  11  . Vitamin D, Ergocalciferol, (DRISDOL) 50000 UNITS CAPS Take 50,000 Units by mouth every 7 (seven) days. On sundays      .  dexlansoprazole (DEXILANT) 60 MG capsule Take 1 capsule (60 mg total) by mouth daily.  15 capsule  0   No current facility-administered medications on file prior to visit.    Allergies  Allergen Reactions  . Peanut-Containing Drug Products Hives, Itching and Swelling  . Penicillins     REACTION: rash and sob  . Sulfa Antibiotics Hives  . Zithromax [Azithromycin] Nausea And Vomiting    Family History  Problem Relation Age of Onset  . Diabetes Father   . Hypertension Father   . Hyperlipidemia Father   . Breast cancer Mother   . Colon cancer Neg Hx   . Esophageal cancer Neg Hx   . Stomach cancer Neg Hx   . Rectal cancer Neg Hx     BP 110/70  Pulse 102  Temp(Src) 98.2 F (36.8 C) (Oral)  Ht 5\' 2"  (1.575 m)  Wt 149 lb (67.586 kg)  BMI 27.25 kg/m2  SpO2 94%  Review of Systems denies hypoglycemia.  She has lost weight.      Objective:   Physical Exam VITAL SIGNS:  See vs page GENERAL: no distress  Lab  Results  Component Value Date   HGBA1C 8.6* 03/13/2013      Assessment & Plan:  DM: she needs increased rx Economic circumstances: this are limiting the rx of DM.

## 2013-03-13 NOTE — Patient Instructions (Signed)
blood tests are being requested for you today.  We'll contact you with results. i have sent prescriptions to your pharmacy, to resume your diabetes medications. Please come back for a follow-up appointment in 3 months. check your blood sugar once a day.  vary the time of day when you check, between before the 3 meals, and at bedtime.  also check if you have symptoms of your blood sugar being too high or too low.  please keep a record of the readings and bring it to your next appointment here.  You can write it on any piece of paper.  please call us sooner if your blood sugar goes below 70, or if you have a lot of readings over 200.

## 2013-03-14 LAB — MICROALBUMIN / CREATININE URINE RATIO
Creatinine, Urine: 125.8 mg/dL
MICROALB UR: 1.29 mg/dL (ref 0.00–1.89)
Microalb Creat Ratio: 10.3 mg/g (ref 0.0–30.0)

## 2013-06-03 ENCOUNTER — Telehealth: Payer: Self-pay | Admitting: Pulmonary Disease

## 2013-06-03 NOTE — Telephone Encounter (Signed)
Per SN---  Pt last seen 10/2012.  Needs an appt ----pt will come in tomorrow for appt with SN.  Nothing further is needed.

## 2013-06-03 NOTE — Telephone Encounter (Signed)
Called and spoke with pt and she stated that she has been having the nasal and chest congestion off and on since April.  She has been using delsym, saline spray, nasonex and zyrtec.  She is blowing out thich green congestion from her nose and sometimes coughs up bloody sptum at times.  Sometimes her nose will bleed.  Pt is requesting that something be called to her pharmacy.  SN please advise. Thanks  Allergies  Allergen Reactions  . Peanut-Containing Drug Products Hives, Itching and Swelling  . Penicillins     REACTION: rash and sob  . Sulfa Antibiotics Hives  . Zithromax [Azithromycin] Nausea And Vomiting     Current Outpatient Prescriptions on File Prior to Visit  Medication Sig Dispense Refill  . albuterol (PROAIR HFA) 108 (90 BASE) MCG/ACT inhaler Inhale 2 puffs into the lungs every 6 (six) hours as needed.  1 Inhaler  11  . aspirin 81 MG tablet Take 81 mg by mouth daily.      . beclomethasone (QVAR) 80 MCG/ACT inhaler Inhale 1 puff into the lungs 2 (two) times daily.  1 Inhaler  6  . cetirizine (ZYRTEC) 10 MG tablet Take 10 mg by mouth daily as needed. For allergies      . dexlansoprazole (DEXILANT) 60 MG capsule Take 1 capsule (60 mg total) by mouth daily.  15 capsule  0  . doxycycline (VIBRA-TABS) 100 MG tablet Take 1 tablet (100 mg total) by mouth 2 (two) times daily.  20 tablet  0  . Fluticasone-Salmeterol (ADVAIR DISKUS) 100-50 MCG/DOSE AEPB Inhale 1 puff into the lungs every 12 (twelve) hours.  60 each  0  . linagliptin (TRADJENTA) 5 MG TABS tablet Take 5 mg by mouth daily.      . metFORMIN (GLUCOPHAGE) 500 MG tablet Take 2 tablets (1,000 mg total) by mouth 2 (two) times daily with a meal.  120 tablet  11  . mometasone (NASONEX) 50 MCG/ACT nasal spray Place 2 sprays into the nose at bedtime as needed.  17 g  11  . nateglinide (STARLIX) 120 MG tablet Take 1 tablet (120 mg total) by mouth 3 (three) times daily before meals.  90 tablet  11  . pioglitazone (ACTOS) 45 MG tablet Take 1  tablet (45 mg total) by mouth daily.  30 tablet  11  . rosuvastatin (CRESTOR) 10 MG tablet Take 1 tablet (10 mg total) by mouth daily.  30 tablet  11  . Vitamin D, Ergocalciferol, (DRISDOL) 50000 UNITS CAPS capsule TAKE 1 CAPSULE BY MOUTH EVERY 7 DAYS  4 capsule  1  . Vitamin D, Ergocalciferol, (DRISDOL) 50000 UNITS CAPS Take 50,000 Units by mouth every 7 (seven) days. On sundays       No current facility-administered medications on file prior to visit.

## 2013-06-04 ENCOUNTER — Ambulatory Visit (INDEPENDENT_AMBULATORY_CARE_PROVIDER_SITE_OTHER): Payer: No Typology Code available for payment source | Admitting: Pulmonary Disease

## 2013-06-04 ENCOUNTER — Encounter: Payer: Self-pay | Admitting: Pulmonary Disease

## 2013-06-04 VITALS — BP 130/70 | HR 87 | Temp 98.0°F | Ht 62.0 in | Wt 151.0 lb

## 2013-06-04 DIAGNOSIS — I498 Other specified cardiac arrhythmias: Secondary | ICD-10-CM

## 2013-06-04 DIAGNOSIS — K5730 Diverticulosis of large intestine without perforation or abscess without bleeding: Secondary | ICD-10-CM

## 2013-06-04 DIAGNOSIS — Z86010 Personal history of colonic polyps: Secondary | ICD-10-CM

## 2013-06-04 DIAGNOSIS — F411 Generalized anxiety disorder: Secondary | ICD-10-CM

## 2013-06-04 DIAGNOSIS — J309 Allergic rhinitis, unspecified: Secondary | ICD-10-CM

## 2013-06-04 DIAGNOSIS — E119 Type 2 diabetes mellitus without complications: Secondary | ICD-10-CM

## 2013-06-04 DIAGNOSIS — E7800 Pure hypercholesterolemia, unspecified: Secondary | ICD-10-CM

## 2013-06-04 DIAGNOSIS — J45909 Unspecified asthma, uncomplicated: Secondary | ICD-10-CM

## 2013-06-04 DIAGNOSIS — E559 Vitamin D deficiency, unspecified: Secondary | ICD-10-CM

## 2013-06-04 DIAGNOSIS — K219 Gastro-esophageal reflux disease without esophagitis: Secondary | ICD-10-CM

## 2013-06-04 MED ORDER — DOXYCYCLINE HYCLATE 100 MG PO TABS: 100.0000 mg | ORAL_TABLET | Freq: Every day | ORAL | Status: DC

## 2013-06-04 NOTE — Progress Notes (Addendum)
Subjective:    Patient ID: Lindsay Henderson, female    DOB: 31-Mar-1955, 58 y.o.   MRN: [id number]  HPI 58 y/o BF here for a follow up visit... she has multiple medical problems including hx Asthma, hx WPW, Chol, DM, GERD/ Divertics/ Polyps, Vit D defic, Anxiety/ Somatization, & hx poor compliance w/ med rx...  ~  March 29, 2011:  72moROV & she has seen DrEllison monthly for DM control & [facility];  His notes indicate that she has been inconsistent w/ meds- eg. ran out of Glimep in Feb & A1c is up to 7.9; she indicates that he wanted to change Januvia to Victoza...  She reports that her breathing is stable on Advair/ Proventil... See prob list below>>  LABS 3/13:  FLP- no better, not at goals on diet alone;  Chems- ok x BS=131, A1c=7.9;  CBC- wnl;  TSH=1.27;  VitD=23;  UA=clear  ~  September 29, 2011:  631moOV & Pat continues to complain of sinus issues- pressure, drainage, discomfort; on Zyrtek, Nasonex, Advair, Delsym and we discussed Rx w/ alt antihist, Saline, Nasonex, etc...     She continues to follow up w/ DrEllison for DM> last A1c=8.2 in Jun2013; meds adjust & placed on Parlodel2.91m61m"it's a drug that controls everything but it didn't work for me, I felt jittery, etc"; she has upcoming appt & labs 9/13 showed BS=173, A1c=7.8 (sl improved); plans per DrEllison...    We reviewed prob list, meds, xrays and labs> see below for updates >>  LABS 9/13:  FLP- at goals x LDL=104 on Cres10;  Chems ok x BS=173 A1c=7.8 on Metform/ Januv/ Amaryl...   ~  August 29, 2012:  72m49mo & CPX> Pat Fraser Din had a good yr, followed regularly every 69mo 60morEllison, her CC is her allergies... We reviewed the following medical problems during today's office visit >>     AR & Asthma> prev on Advair100 but she prefers daughter's QVar80- 2spBid (plus Proair HFA as needed); she uses Zyrtek & Nasonex for her nasal allergies; denies breathing problems or asthma exac...    Hx WPW & SVT> prev on  ASA81 but it causes heartburn therefore stopped; followed by DrTaylor w/ hx catheter ablation in 2004; she denies palpit, dizzy, syncope, etc...    CHOL> she nevere filled Cres10 last yr; on diet alone & FLP 8/14 shows TChol 210, TG 136, HDL 42, LDL 153; Rec- start Cres10 daily & f/u FLP 69mo..82mo DM> on Metformin500-2Bid, Tradjenta5, Starlix120Tid, Actos45; plus DM diet efforts; Labs 8/14 show BS=159, A1c=7.6, and she wants to avoid insulin; has seen [facility], continue w/ DrEllison...    GERD> on Dexilant60mg/d72mollowed by DrStark w/ EGD in 2009- GERD, gastritis; (Note- Omep doesn't work for her & GI gives her Dexilant samples)...    Divertics, Polyps> she had 4mm ade22matous polyp removed 11/07 & 60yr f/u 67yrn is overdue, she cancelled Colon in 2013, has f/u appt w/ GI soon...    Elev LFTs> mild incr LFTs in the past, likely FLD (per sonar 2009), she weighs 159# (down 3#) & LFTs remain norm at this time; we discussed diet, exercise, get wt down...    Vit D Defic> on VitD 50K weekly; Labs show VitD level = 42 & rec to continue same...    Anxiety> she has mult somatic complaints, prev used Alprazolam as needed but it is no longer on her list... We reviewed prob list, meds,  xrays and labs> see below for updates >>   LABS 8/14:  FLP- not at goals on diet alone LDL=153;  Chems- ok x BS=159 A1c=7.6;  CBC- wnl;  TSH=1.22;  VitD=42...  ~  Jun 04, 2013:  53moROV & add-on appt requested by pt for 143mox URI, sinus tenderness, congestion, cough w/ green/yellow sput, hoarseness; states she saw ENT (?who ?when- we don't have notes), looked up nose- has dev septum, they wanted to do surg (she is holding off), given shot; she takes Zyrtek, Nasal Saline/ Netti pot, Flonase OTC; prev on allergy shots [facility].  We discussed checking CXR, Sinus films, Labs;  Adjust therapy to avoid steroids given her poorly controlled DM- Rx w/ Doxy x10d at her request, try [name]180, continue Saline nasal mist Q1-2h,  and Flonase vs Dymista Qhs;  [place]...     AR & Asthma> as above; she has albutHFA for prn use & off all other Asthma inhalers at this time...     Hx WPW & SVT> on ASA81 again; followed by DrTaylor w/ hx catheter ablation in 2004; she denies palpit, dizzy, syncope, etc...    CHOL> on Cres10 now but never ret for f/u FLP on drug- last FLP 8/14 on diet alone showed TChol 210, TG 136, HDL 42, LDL 153; she is not fasting today...    DM> on Metformin500-2Bid, Tradjenta5, Starlix120Tid, Actos45 per DrEllison; last labs 3/15 showed BS~200, A1c=8.6; they are considering insulin rx...    GERD> off prev Dexilant & taking OTC Nexium20 prn; followed by DrStark w/ EGD in 2009- GERD, gastritis; (Note- Omep doesn't work for her & GI gave her Dexilant samples)...    Divertics, Polyps> she had 26m23mdenomatous polyp removed 11/07 & 37yr26yr colon is overdue, she cancelled Colon in 2013, & promises to call DrStark ASAP...    Elev LFTs> mild incr LFTs in the past, likely FLD (per sonar 2009), she weighs 151# (down 8#) & LFTs remain norm at last check; we discussed diet, exercise, get wt down...    Vit D Defic> on VitD 50K weekly; Labs show VitD level = 42 & rec to continue same...    Anxiety> she has mult somatic complaints, prev used Alprazolam as needed but it is no longer on her list... We reviewed prob list, meds, xrays and labs> see below for updates >>   CXR 5/15 => norm heart size, mild uncoiling of Ao, clear lungs, NAD...  Sinus Films 5/15 => NAD, hypoplastic frontals, no A/F levels etc...  LABS 5/15 => Chems- ok x BS=190;  CBC- wnl;  IgE= 111 (0-180) PLAN> Rx w/ Doxy x10d, [name]180, Nasal Saline, Flonase vs Dymista qhs, and refer to [facility]...           Problem List:      ALLERGIC RHINITIS (ICD-477.9) - controlled on Zyrtek, Nasonex, & Saline...  Note: ENT eval in past was neg. ~  5/15: presented w/ 46mo 76moinus  congestion, drainage, cough, sput, HA, hoarseness, etc; treated w/ Doxy at her request, plus allegra, Saline, Flonase vs Dymista, & refer to [facility] for testing...  ASTHMA (ICD-493.90) - controlled on ADVAIR 100 Bid (but only using it Prn now), & PROAIR Prn... uses Mucinex Prn as well. 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 ~  8/14: prev on Advair100 but she prefers daughter's QVar80- 2spBid (  plus Proair HFA as needed); she uses Zyrtek & Nasonex for her nasal allergies; denies breathing problems or asthma exac. ~  5/15: off all inhalers x albutHFA as needed; denies asthma exac...  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 ~  Myoview 7/06 was normal w/o ischemia, norm wall motion, EF=93%...  ~  2DEcho 7/06 w/ norm LVF... ~  EKG 6/10 shows NSR, rate 90, NAD... ~  She has intermittent mild palpit, none recently...   HYPERCHOLESTEROLEMIA (ICD-272.0) - prev on Lipitor20 & she tolerated it well, but everytime she starts it- she stops it again w/o explanation (once because her husb had a reaction to it); she declines other statins, declines Lipid Clinic referral, wants to do diet alone. ~  [place] 9/08 on Lip20 showed TChol 140, TG 89, HDL 37, LDL 85 ~  FLP 6/09 on Lip20 showed TChol 138, TG 55, HDL 35, LDL 92... rec incr exercise, same med. ~  FLP 6/10 ?on Lip20 showed TChol 181, TG 121, HDL 41, LDL 116... rec> take meds regularly! ~  FLP 5/11 off med showed TChol 220, TG 143, HDL 47, LDL 165... rec> restart Lip20 every day! ~  [place] 2/12 on diet alone showed TChol 216, TG 116, HDL 46, LDL 164... she refuses meds, & will do the best she can on diet alone. ~  FLP 3/13 on diet alone showed TChol 215, TG 80, HDL 58, LDL 150... She agrees to try CRESTOR61m/d. ~  [place] Cres10 showed TChol 169. TG 102, HDL 45, LDL 104...  Improved, continue same, better diet. ~  FLP 8/14 off Cres on diet alone showed TChol 210, TG 136, HDL 42, LDL 153... Rec to restart the Cres10.  DIABETES MELLITUS, TYPE II (ICD-250.00) - now followed by DrEllison w/ freq med changes (his notes are reviewed)... ~  labs 9/08 on GlucovQam showed BS= 122, HgA1c= 7.4... she was told to [place] but didn't do it! ~  labs 6/09 showed BS= 166, HgA1c= 8.2..Marland KitchenMarland Kitchenrec to incr the Glucovance to Bid!!! ~  labs 6/10 showed BS= 161, A1c= 8.1, Microalb= neg... decided to change Rx to Metformin 1000Bid, Glimepiride 442md, Januvia 10023m. ~  10/10: pt reports that she's not taking the Januvia due to $$, but can fill it now since deduct is met! ~  labs 5/11 ?on 3 meds? showed BS= 95, A1c= 7.7... Marland KitchenMarland Kitchenc to get on diet, take meds regularly. ~  labs 2/12 on 3 meds showed BS= 127, A1c= 7.5... Marland KitchenMarland Kitchene is happy w/ this result & doesn't want more med. ~  She saw DrDolan for optometry 3/12 & no retinopathy seen... ~  Labs 9/12 showed BS= 129, A1c= 7.7, and we will refer pt to Endocrine/DM in light of her worsening A1c on 3 max oral meds ([facility] ~  DM now followed by DrEllison monthly w/ med adjustments==> his med lists reviewed but appeared incomplete (ie Metform & Glimep not listed at their last OV)... ~  Labs 3/13 on Metform2Bid+Actos45+Januv100 showed BS=131, A1c=7.9... Marland KitchenMarland Kitchenc to restart the Glimep she ran out of & f/u w/ DrEllison. ~  6/13:  She saw Optometry, DrDolan> no retinopathy... ~  Labs 9/13 on Metform2Bid+Januv100+Glimep2/d, Parlodel2.5/d (not taking) showed BS= 173, A1c= 7.8; plans per endocrine. ~  8/14: on Metformin500-2Bid, Tradjenta5, Starlix120Tid, Actos45; plus DM diet efforts; Labs 8/14 show BS=159, A1c=7.6, and she wants to  avoid insulin; has seen [facility], continue w/ DrEllison. ~  [place] for [facility]...  GERD (ICD-530.81) - on DEXILANT 58m/d... Note: Omep40 didn't work for her. ~  GI  eval 9/09 by DrStark- EGD showed gastritis, GERD;   ~  AbdSonar 9/09 showed fatty liver, cyst in left hep lobe, bilat renal cysts & stones.  ~  Hx mild elev LFTs likely due to fatty liver disease... ~  Labs 3/13 showed normal LFTs w/ SGOT=25, SGPT=34 ~  Labs 9/13 showed SGOT= 32, SGPT= 44 ~  8/14: she notes that Prilosec & Nexium don't work for her, needs Dexilant but only using it when she gets samples; Labs show SGOT=22, SGPT=24 w/ wt reduction...  DIVERTICULOSIS OF COLON (ICD-562.10) COLONIC POLYPS (ICD-211.3) ~  last colonoscopy was 11/07 showing divertics and 468mpolyp (adenomatous)... f/u planned 5y68yr~  9/12:  We will send reminder to DrStark for colonoscopy due... ~  12/12:  DrStark sent letter to pt regarding colonoscopy due... ~  3/13:  We reviewed the need for f/u screening colonoscopy & she is encouraged to call & set this up... ~  5/13:  She saw DrStark & has a follow up colonoscopy scheduled> she cancelled due to the copay required... ~  8/14: she has f/u appt w/ DrStark soon to discuss...  RENAL CYST (ICD-593.2) - followed by DrRINOMVEHM AbdSonar 9/09 showed bilat renal cysts and stones in kidneys.  VITAMIN D DEFICIENCY (ICD-268.9) - supposed to be taking Vit D OTC supplement 2000 u daily. ~  BMD here 9/08 showed TScores -0.3 in Spine, and -0.4 in left FemNeck ~  labs 6/10 showed Vit D level = 15 and she was started on OTC Vit D 2000u daily. ~  BMD here 6/11 was normal w/ TScores -0.5 in Spine, and -0.3 in FemNecks... rec to continue Calcium, MVI, Vit D. ~  labs 2/12 showed Vit D level = 19 and she is reminded to take supplement daily. ~  Labs 9/12 showed Vit D level = 21... rec to switch to 50000u supplement Rx weekly. ~  Labs 3/13 showed Vit D level = 23... ?if taking the 50K weekly, rec to stay on this! ~  Labs 8/14 showed Vit D level = 42... Continue the 50K weekly.  ANXIETY (ICD-300.00) - prev used Alprazolam 0.5mg55mn but it is no longer on her med list. SOMATIC  DYSFUNCTION (ICD-739.9) - hx mult somatic complaints...  Health Maintenance >> ~  GYN: followed by DrMarshall's office for PAP smears etc... ~  Mammograms at [facility] were OK according to the pt... ~  Immunizations:  she had Flu shot 2010 at work... ?if they gave her a Pneumonia shot... ?last Tetanus vaccine. ~  She declines the 2012 Flu vaccine here, encouraged to get it at work...   Past Surgical History  Procedure Laterality Date  . Tubal ligation    . Cardiac electrophysiology mapping and ablation    . Colonoscopy      Outpatient Encounter Prescriptions as of 06/04/2013  Medication Sig  . albuterol (PROAIR HFA) 108 (90 BASE) MCG/ACT inhaler Inhale 2 puffs into the lungs every 6 (six) hours as needed.  . asMarland Kitchenirin 81 MG tablet Take 81 mg by mouth daily.  . cetirizine (ZYRTEC) 10 MG tablet Take 10 mg by mouth daily as needed. For allergies  . esomeprazole (NEXIUM) 20 MG capsule Take 20 mg by mouth daily at 12 noon.  . fluticasone (FLONASE) 50 MCG/ACT nasal spray Place 2 sprays into both nostrils  daily.  . linagliptin (TRADJENTA) 5 MG TABS tablet Take 5 mg by mouth daily.  . metFORMIN (GLUCOPHAGE) 500 MG tablet Take 2 tablets (1,000 mg total) by mouth 2 (two) times daily with a meal.  . nateglinide (STARLIX) 120 MG tablet Take 1 tablet (120 mg total) by mouth 3 (three) times daily before meals.  . pioglitazone (ACTOS) 45 MG tablet Take 1 tablet (45 mg total) by mouth daily.  . rosuvastatin (CRESTOR) 10 MG tablet Take 1 tablet (10 mg total) by mouth daily.  . Vitamin D, Ergocalciferol, (DRISDOL) 50000 UNITS CAPS capsule TAKE 1 CAPSULE BY MOUTH EVERY 7 DAYS  . beclomethasone (QVAR) 80 MCG/ACT inhaler Inhale 1 puff into the lungs 2 (two) times daily.  Marland Kitchen doxycycline (VIBRA-TABS) 100 MG tablet Take 1 tablet (100 mg total) by mouth daily.  . [DISCONTINUED] dexlansoprazole (DEXILANT) 60 MG capsule Take 1 capsule (60 mg total) by mouth daily.  . [DISCONTINUED] doxycycline (VIBRA-TABS) 100 MG tablet  Take 1 tablet (100 mg total) by mouth 2 (two) times daily.  . [DISCONTINUED] Fluticasone-Salmeterol (ADVAIR DISKUS) 100-50 MCG/DOSE AEPB Inhale 1 puff into the lungs every 12 (twelve) hours.  . [DISCONTINUED] mometasone (NASONEX) 50 MCG/ACT nasal spray Place 2 sprays into the nose at bedtime as needed.  . [DISCONTINUED] Vitamin D, Ergocalciferol, (DRISDOL) 50000 UNITS CAPS Take 50,000 Units by mouth every 7 (seven) days. On sundays    Allergies  Allergen Reactions  . Peanut-Containing Drug Products Hives, Itching and Swelling  . Penicillins     REACTION: rash and sob  . Sulfa Antibiotics Hives  . Zithromax [Azithromycin] Nausea And Vomiting    Current Medications, Allergies, Past Medical History, Past Surgical History, Family History, and Social History were reviewed in [employer] record.    Review of Systems        See HPI - all other systems neg except as noted... The patient denies anorexia, fever, weight loss, weight gain, vision loss, decreased hearing, hoarseness, chest pain, syncope, dyspnea on exertion, peripheral edema, prolonged cough, headaches, hemoptysis, abdominal pain, melena, hematochezia, severe indigestion/heartburn, hematuria, incontinence, muscle weakness, suspicious skin lesions, transient blindness, difficulty walking, depression, unusual weight change, abnormal bleeding, enlarged lymph nodes, and angioedema.     Objective:   Physical Exam    WD, WN, 58 y/o BF in NAD...   GENERAL:  Alert & oriented; pleasant & cooperative... HEENT:  [place]/AT, EOM-wnl,  EACs-clear, TMs-wnl, NOSE-clear, THROAT-clear & wnl. NECK:  Supple w/ full ROM; no JVD; normal carotid impulses w/o bruits; no thyromegaly or nodules palpated; no lymphadenopathy. CHEST:  Clear to P & A; without wheezes/ rales/ or rhonchi heard... HEART:  Regular Rhythm; without murmurs/ rubs/ or gallops. ABDOMEN:  Soft & nontender; normal bowel sounds; no organomegaly or masses detected, neg  CVA tenderness.  EXT: without deformities, mild arthritic changes; no varicose veins/ venous insuffic/ or edema. NEURO:  CN's intact; no focal neuro deficits... DERM:  No lesions noted; no rash etc...  RADIOLOGY DATA:  Reviewed in the EPIC EMR & discussed w/ the patient...  LABORATORY DATA:  Reviewed in the EPIC EMR & discussed w/ the patient...   Assessment & Plan:    AR & Asthma> she uses Zyrtek & Nasonex for her nasal allergies; Rec to incr Rx w/ [name], Saline, Flonase vs Dymista; refer to [facility] for further eval...     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     CHOL> she stopped  Cres10 & FLP on diet alone w/ LDL ~150; rec to restart this med...     DM> on mult meds from [place], her DM specialist; she has had freq adjustments in meds & A1c=7.6 now on her 4 med regimen, she still wants to avoid insulin...     GERD> prev on Dexilant63m/d & followed by DrStark (Omep doesn't work for her)...     Divertics, Polyps> she had 420madenomatous polyp removed 11/07 & 5y6yru colon is overdue but she cancelled last yr due to copay required; she has appt to discuss w/ DrStark...     Elev LFTs> mild incr LFTs in past, likely FLD (per sonar 2009), she weighs 159# now & LFTs remain normal...     Vit D Defic> on VitD 50K weekly & Level= 42, continue same...     Anxiety> she has mult somatic complaints, prev used alprazolam as needed but it is no longer on her list...   Patient's Medications  New Prescriptions   DOXYCYCLINE (VIBRA-TABS) 100 MG TABLET    Take 1 tablet (100 mg total) by mouth daily.  Previous Medications   ALBUTEROL (PROAIR HFA) 108 (90 BASE) MCG/ACT INHALER    Inhale 2 puffs into the lungs every 6 (six) hours as needed.   ASPIRIN 81 MG TABLET    Take 81 mg by mouth daily.   BECLOMETHASONE (QVAR) 80 MCG/ACT INHALER    Inhale 1 puff into the lungs 2 (two) times daily.   CETIRIZINE (ZYRTEC) 10 MG TABLET    Take 10 mg  by mouth daily as needed. For allergies   ESOMEPRAZOLE (NEXIUM) 20 MG CAPSULE    Take 20 mg by mouth daily at 12 noon.   FLUTICASONE (FLONASE) 50 MCG/ACT NASAL SPRAY    Place 2 sprays into both nostrils daily.   LINAGLIPTIN (TRADJENTA) 5 MG TABS TABLET    Take 5 mg by mouth daily.   METFORMIN (GLUCOPHAGE) 500 MG TABLET    Take 2 tablets (1,000 mg total) by mouth 2 (two) times daily with a meal.   NATEGLINIDE (STARLIX) 120 MG TABLET    Take 1 tablet (120 mg total) by mouth 3 (three) times daily before meals.   PIOGLITAZONE (ACTOS) 45 MG TABLET    Take 1 tablet (45 mg total) by mouth daily.   ROSUVASTATIN (CRESTOR) 10 MG TABLET    Take 1 tablet (10 mg total) by mouth daily.   VITAMIN D, ERGOCALCIFEROL, (DRISDOL) 50000 UNITS CAPS CAPSULE    TAKE 1 CAPSULE BY MOUTH EVERY 7 DAYS  Modified Medications   No medications on file  Discontinued Medications   DEXLANSOPRAZOLE (DEXILANT) 60 MG CAPSULE    Take 1 capsule (60 mg total) by mouth daily.   DOXYCYCLINE (VIBRA-TABS) 100 MG TABLET    Take 1 tablet (100 mg total) by mouth 2 (two) times daily.   FLUTICASONE-SALMETEROL (ADVAIR DISKUS) 100-50 MCG/DOSE AEPB    Inhale 1 puff into the lungs every 12 (twelve) hours.   MOMETASONE (NASONEX) 50 MCG/ACT NASAL SPRAY    Place 2 sprays into the nose at bedtime as needed.   VITAMIN D, ERGOCALCIFEROL, (DRISDOL) 50000 UNITS CAPS    Take 50,000 Units by mouth every 7 (seven) days. On sundays

## 2013-06-04 NOTE — Patient Instructions (Signed)
Today we updated your med list in our EPIC system...    Continue your current medications the same...  We decided to change the Zyrtek to [id number]- take one tab daily...  Continue the NASAL SALINE MIST vs Netti pot treatments every 1-2H during the day...  Use the FLONASE vs DYMISTA sample-  2 sp in each ostril at bedtime...  Today we checked a f/u CXR, Sinus films and blood work...    We will contact you w/ the results when available...   Finally we will set up a referral to the [facility] for testing...  Call for any questions.Marland KitchenMarland Kitchen

## 2013-06-05 ENCOUNTER — Other Ambulatory Visit (INDEPENDENT_AMBULATORY_CARE_PROVIDER_SITE_OTHER): Payer: No Typology Code available for payment source

## 2013-06-05 ENCOUNTER — Other Ambulatory Visit: Payer: No Typology Code available for payment source

## 2013-06-05 ENCOUNTER — Ambulatory Visit (INDEPENDENT_AMBULATORY_CARE_PROVIDER_SITE_OTHER)
Admission: RE | Admit: 2013-06-05 | Discharge: 2013-06-05 | Disposition: A | Discharge status: Home / Self Care | Payer: No Typology Code available for payment source | Source: Ambulatory Visit | Attending: Pulmonary Disease | Admitting: Pulmonary Disease

## 2013-06-05 DIAGNOSIS — J309 Allergic rhinitis, unspecified: Secondary | ICD-10-CM

## 2013-06-05 DIAGNOSIS — K5730 Diverticulosis of large intestine without perforation or abscess without bleeding: Secondary | ICD-10-CM

## 2013-06-05 DIAGNOSIS — Z86010 Personal history of colonic polyps: Secondary | ICD-10-CM

## 2013-06-05 DIAGNOSIS — K219 Gastro-esophageal reflux disease without esophagitis: Secondary | ICD-10-CM

## 2013-06-05 DIAGNOSIS — E119 Type 2 diabetes mellitus without complications: Secondary | ICD-10-CM

## 2013-06-05 DIAGNOSIS — J45909 Unspecified asthma, uncomplicated: Secondary | ICD-10-CM

## 2013-06-05 LAB — CBC WITH DIFFERENTIAL/PLATELET
Basophils Absolute: 0 10*3/uL (ref 0.0–0.1)
Basophils Relative: 0.2 % (ref 0.0–3.0)
Eosinophils Absolute: 0.3 10*3/uL (ref 0.0–0.7)
Eosinophils Relative: 3.4 % (ref 0.0–5.0)
HCT: 41.1 % (ref 36.0–46.0)
Hemoglobin: 13.7 g/dL (ref 12.0–15.0)
Lymphocytes Relative: 23.7 % (ref 12.0–46.0)
Lymphs Abs: 2 10*3/uL (ref 0.7–4.0)
MCHC: 33.3 g/dL (ref 30.0–36.0)
MCV: 88.8 fl (ref 78.0–100.0)
MONOS PCT: 5.3 % (ref 3.0–12.0)
Monocytes Absolute: 0.4 10*3/uL (ref 0.1–1.0)
Neutro Abs: 5.6 10*3/uL (ref 1.4–7.7)
Neutrophils Relative %: 67.4 % (ref 43.0–77.0)
PLATELETS: 242 10*3/uL (ref 150.0–400.0)
RBC: 4.63 Mil/uL (ref 3.87–5.11)
RDW: 13.2 % (ref 11.5–15.5)
WBC: 8.3 10*3/uL (ref 4.0–10.5)

## 2013-06-05 LAB — BASIC METABOLIC PANEL
BUN: 9 mg/dL (ref 6–23)
CALCIUM: 9.7 mg/dL (ref 8.4–10.5)
CHLORIDE: 102 meq/L (ref 96–112)
CO2: 25 mEq/L (ref 19–32)
Creatinine, Ser: 0.6 mg/dL (ref 0.4–1.2)
GFR: 122.24 mL/min (ref >60.00)
Glucose, Bld: 190 mg/dL — ABNORMAL HIGH (ref 70–99)
Potassium: 4.1 mEq/L (ref 3.5–5.1)
SODIUM: 139 meq/L (ref 135–145)

## 2013-06-05 NOTE — Addendum Note (Signed)
Addended by: Parke Poisson E on: 06/05/2013 09:55 AM   Modules accepted: Orders

## 2013-06-06 LAB — IGE: IgE (Immunoglobulin E), Serum: 111.3 IU/mL (ref 0.0–180.0)

## 2013-06-12 ENCOUNTER — Ambulatory Visit: Payer: No Typology Code available for payment source | Admitting: Endocrinology

## 2013-06-19 ENCOUNTER — Encounter: Payer: Self-pay | Admitting: Endocrinology

## 2013-06-19 ENCOUNTER — Ambulatory Visit (INDEPENDENT_AMBULATORY_CARE_PROVIDER_SITE_OTHER): Payer: No Typology Code available for payment source | Admitting: Endocrinology

## 2013-06-19 VITALS — BP 124/80 | HR 87 | Temp 98.1°F | Ht 62.0 in | Wt 148.0 lb

## 2013-06-19 DIAGNOSIS — E119 Type 2 diabetes mellitus without complications: Secondary | ICD-10-CM

## 2013-06-19 LAB — HEMOGLOBIN A1C: Hgb A1c MFr Bld: 9 % — ABNORMAL HIGH (ref 4.6–6.5)

## 2013-06-19 MED ORDER — CANAGLIFLOZIN 300 MG PO TABS: 1.0000 | ORAL_TABLET | Freq: Every day | ORAL | Status: DC

## 2013-06-19 MED ORDER — LINAGLIPTIN 5 MG PO TABS: 5.0000 mg | ORAL_TABLET | Freq: Every day | ORAL | Status: DC

## 2013-06-19 MED ORDER — FLUCONAZOLE 150 MG PO TABS: 150.0000 mg | ORAL_TABLET | Freq: Once | ORAL | Status: DC

## 2013-06-19 NOTE — Patient Instructions (Addendum)
blood tests are being requested for you today.  We'll contact you with results. Based on the results, we may need to add "invokana." here is a discount card.   Drink plenty of fluids while taking this.  If you get a yeast infection, here is a prescription for "fluconazole."   Please come back for a follow-up appointment in 3 months.

## 2013-06-19 NOTE — Progress Notes (Signed)
Subjective:    Patient ID: Lindsay Henderson, female    DOB: July 11, 1955, 58 y.o.   MRN: [id number]  HPI Pt returns for f/u of type 2 DM (dx'ed 2000; she has mild if any neuropathy of the lower extremities; no known associated complications; she has never had severe hypoglycemia or DKA; she has never taken insulin; she takes 4 oral meds; she did not tolerate parlodel).  She did not tolerate the actos, due to diarrhea. She stopped it, and the diarrhea resolved.   She takes the other 3 meds as rx'ed.  no cbg record, but states cbg's are in the high-100's.  Past Medical History  Diagnosis Date  . Allergic rhinitis   . Asthma   . Anomalous atrioventricular excitation   . History of supraventricular tachycardia   . Hypercholesteremia   . Type II or unspecified type diabetes mellitus without mention of complication, not stated as uncontrolled   . GERD (gastroesophageal reflux disease)   . Gastritis   . Diverticulosis of colon   . Adenomatous colon polyp 11/2005  . Nephrolithiasis   . Renal cyst   . Vitamin D deficiency   . Somatic dysfunction   . Anxiety     no per pt  . Kidney stones     Past Surgical History  Procedure Laterality Date  . Tubal ligation    . Cardiac electrophysiology mapping and ablation    . Colonoscopy      History   Social History  . Marital Status: Married    Spouse Name: N/A    Number of Children: 4  . Years of Education: N/A   Occupational History  . [employer]    Social History Main Topics  . Smoking status: Never Smoker   . Smokeless tobacco: Never Used  . Alcohol Use: No  . Drug Use: No  . Sexual Activity: Not on file   Other Topics Concern  . Not on file   Social History Narrative  . No narrative on file    Current Outpatient Prescriptions on File Prior to Visit  Medication Sig Dispense Refill  . albuterol (PROAIR HFA) 108 (90 BASE) MCG/ACT inhaler Inhale 2 puffs into the lungs every 6 (six) hours as needed.  1 Inhaler  11  .  aspirin 81 MG tablet Take 81 mg by mouth daily.      . beclomethasone (QVAR) 80 MCG/ACT inhaler Inhale 1 puff into the lungs 2 (two) times daily.  1 Inhaler  6  . cetirizine (ZYRTEC) 10 MG tablet Take 10 mg by mouth daily as needed. For allergies      . doxycycline (VIBRA-TABS) 100 MG tablet Take 1 tablet (100 mg total) by mouth daily.  10 tablet  0  . esomeprazole (NEXIUM) 20 MG capsule Take 20 mg by mouth daily at 12 noon.      . fluticasone (FLONASE) 50 MCG/ACT nasal spray Place 2 sprays into both nostrils daily.      . metFORMIN (GLUCOPHAGE) 500 MG tablet Take 2 tablets (1,000 mg total) by mouth 2 (two) times daily with a meal.  120 tablet  11  . nateglinide (STARLIX) 120 MG tablet Take 1 tablet (120 mg total) by mouth 3 (three) times daily before meals.  90 tablet  11  . rosuvastatin (CRESTOR) 10 MG tablet Take 1 tablet (10 mg total) by mouth daily.  30 tablet  11  . Vitamin D, Ergocalciferol, (DRISDOL) 50000 UNITS CAPS capsule TAKE 1 CAPSULE BY MOUTH EVERY 7  DAYS  4 capsule  1   No current facility-administered medications on file prior to visit.    Allergies  Allergen Reactions  . Peanut-Containing Drug Products Hives, Itching and Swelling  . Penicillins     REACTION: rash and sob  . Sulfa Antibiotics Hives  . Zithromax [Azithromycin] Nausea And Vomiting    Family History  Problem Relation Age of Onset  . Diabetes Father   . Hypertension Father   . Hyperlipidemia Father   . Breast cancer Mother   . Colon cancer Neg Hx   . Esophageal cancer Neg Hx   . Stomach cancer Neg Hx   . Rectal cancer Neg Hx     BP 124/80  Pulse 87  Temp(Src) 98.1 F (36.7 C) (Oral)  Ht 5\' 2"  (1.575 m)  Wt 148 lb (67.132 kg)  BMI 27.06 kg/m2  SpO2 93%    Review of Systems She has lost a few lbs.  She denies edema.      Objective:   Physical Exam VITAL SIGNS:  See vs page GENERAL: no distress Pulses: dorsalis pedis intact bilat.   Feet: no deformity. normal color and temp.  no  edema Skin:  no ulcer on the feet.   Neuro: sensation is intact to touch on the feet   Lab Results  Component Value Date   HGBA1C 9.0* 06/19/2013      Assessment & Plan:  Diarrhea: new.  uncertain if due to actos.   DM: moderate exacerbation.       Patient is advised the following: Patient Instructions  blood tests are being requested for you today.  We'll contact you with results. Based on the results, we may need to add "invokana." here is a discount card.   Drink plenty of fluids while taking this.  If you get a yeast infection, here is a prescription for "fluconazole."   Please come back for a follow-up appointment in 3 months.

## 2013-07-01 ENCOUNTER — Other Ambulatory Visit: Payer: Self-pay | Admitting: Endocrinology

## 2013-07-24 ENCOUNTER — Encounter: Payer: Self-pay | Admitting: Gastroenterology

## 2013-09-10 ENCOUNTER — Other Ambulatory Visit: Payer: Self-pay | Admitting: Endocrinology

## 2013-09-18 ENCOUNTER — Ambulatory Visit: Payer: No Typology Code available for payment source | Admitting: Endocrinology

## 2013-09-19 ENCOUNTER — Telehealth: Payer: Self-pay | Admitting: Pulmonary Disease

## 2013-09-19 NOTE — Telephone Encounter (Signed)
Pt returning call again.Lindsay Henderson ° °

## 2013-09-19 NOTE — Telephone Encounter (Signed)
Pt returning call.Lindsay Henderson ° °

## 2013-09-19 NOTE — Telephone Encounter (Signed)
lmomtcb x1 

## 2013-09-19 NOTE — Telephone Encounter (Signed)
Called and spoke with pt and she stated that she has no insurance at this time.  She stated that the qvar is $199 and she cannot afford this.  SN is there any other generic medication that she can take that would be more affordable.  Please advise. Thanks Allergies  Allergen Reactions  . Peanut-Containing Drug Products Hives, Itching and Swelling  . Penicillins     REACTION: rash and sob  . Sulfa Antibiotics Hives  . Zithromax [Azithromycin] Nausea And Vomiting    Current Outpatient Prescriptions on File Prior to Visit  Medication Sig Dispense Refill  . albuterol (PROAIR HFA) 108 (90 BASE) MCG/ACT inhaler Inhale 2 puffs into the lungs every 6 (six) hours as needed.  1 Inhaler  11  . aspirin 81 MG tablet Take 81 mg by mouth daily.      . beclomethasone (QVAR) 80 MCG/ACT inhaler Inhale 1 puff into the lungs 2 (two) times daily.  1 Inhaler  6  . Canagliflozin (INVOKANA) 300 MG TABS Take 1 tablet (300 mg total) by mouth daily.  30 tablet  11  . cetirizine (ZYRTEC) 10 MG tablet Take 10 mg by mouth daily as needed. For allergies      . doxycycline (VIBRA-TABS) 100 MG tablet Take 1 tablet (100 mg total) by mouth daily.  10 tablet  0  . esomeprazole (NEXIUM) 20 MG capsule Take 20 mg by mouth daily at 12 noon.      . fluconazole (DIFLUCAN) 150 MG tablet Take 1 tablet (150 mg total) by mouth once.  1 tablet  3  . fluticasone (FLONASE) 50 MCG/ACT nasal spray Place 2 sprays into both nostrils daily.      Marland Kitchen linagliptin (TRADJENTA) 5 MG TABS tablet Take 1 tablet (5 mg total) by mouth daily.  30 tablet  11  . metFORMIN (GLUCOPHAGE) 500 MG tablet Take 2 tablets (1,000 mg total) by mouth 2 (two) times daily with a meal.  120 tablet  11  . nateglinide (STARLIX) 120 MG tablet TAKE ONE TABLET BY MOUTH THREE TIMES DAILY BEFORE MEAL(S)  90 tablet  0  . rosuvastatin (CRESTOR) 10 MG tablet Take 1 tablet (10 mg total) by mouth daily.  30 tablet  11  . Vitamin D, Ergocalciferol, (DRISDOL) 50000 UNITS CAPS capsule  TAKE 1 CAPSULE BY MOUTH EVERY 7 DAYS  4 capsule  1   No current facility-administered medications on file prior to visit.

## 2013-09-19 NOTE — Telephone Encounter (Signed)
lmomtcb for pt 

## 2013-09-19 NOTE — Telephone Encounter (Signed)
[email or web address].Lindsay Henderson, CMA  

## 2013-09-23 NOTE — Telephone Encounter (Signed)
LMTCB

## 2013-09-23 NOTE — Telephone Encounter (Signed)
Per SN---  No generic avail for any of these inhalers.   We rarley get samples in of these. ?what will her insurance cover?  She will need to get a formulary so we can see what meds to choose.

## 2013-09-24 NOTE — Telephone Encounter (Signed)
[email or web address]

## 2013-09-25 NOTE — Telephone Encounter (Signed)
Pt returned call, [phone]

## 2013-09-25 NOTE — Telephone Encounter (Signed)
lmomtcb x1 

## 2013-09-25 NOTE — Telephone Encounter (Signed)
Pt returned call, [phone]

## 2013-09-25 NOTE — Telephone Encounter (Signed)
Spoke with the pt and notified of recs per SN She verbalized understanding and will call once she has the formulary list

## 2013-09-25 NOTE — Telephone Encounter (Signed)
LMTCB

## 2013-10-02 ENCOUNTER — Ambulatory Visit (INDEPENDENT_AMBULATORY_CARE_PROVIDER_SITE_OTHER): Payer: Self-pay | Admitting: Endocrinology

## 2013-10-02 ENCOUNTER — Encounter: Payer: Self-pay | Admitting: Endocrinology

## 2013-10-02 VITALS — BP 138/90 | HR 97 | Temp 98.3°F | Wt 148.0 lb

## 2013-10-02 DIAGNOSIS — E119 Type 2 diabetes mellitus without complications: Secondary | ICD-10-CM

## 2013-10-02 LAB — BASIC METABOLIC PANEL
BUN: 11 mg/dL (ref 6–23)
CALCIUM: 9.2 mg/dL (ref 8.4–10.5)
CHLORIDE: 100 meq/L (ref 96–112)
CO2: 26 meq/L (ref 19–32)
Creatinine, Ser: 0.6 mg/dL (ref 0.4–1.2)
GFR: 131.54 mL/min (ref >60.00)
Glucose, Bld: 214 mg/dL — ABNORMAL HIGH (ref 70–99)
Potassium: 3.6 mEq/L (ref 3.5–5.1)
Sodium: 134 mEq/L — ABNORMAL LOW (ref 135–145)

## 2013-10-02 LAB — HEMOGLOBIN A1C: HEMOGLOBIN A1C: 9.2 % — AB (ref 4.6–6.5)

## 2013-10-02 MED ORDER — GLIMEPIRIDE 4 MG PO TABS: 4.0000 mg | ORAL_TABLET | Freq: Every day | ORAL | Status: DC

## 2013-10-02 NOTE — Patient Instructions (Addendum)
please call [phone] (old office), to get an appointment with a new primary doctor.   blood tests are being requested for you today.  We'll contact you with results. Please change the nateglinide to glimepiride. i have sent a prescription to your pharmacy. Please come back for a follow-up appointment in 2 weeks.   Please see Vaughan Basta the same day, to discuss insulin.   check your blood sugar once a day.  vary the time of day when you check, between before the 3 meals, and at bedtime.  also check if you have symptoms of your blood sugar being too high or too low.  please keep a record of the readings and bring it to your next appointment here.  You can write it on any piece of paper.  please call us sooner if your blood sugar goes below 70, or if you have a lot of readings over 200.

## 2013-10-02 NOTE — Progress Notes (Signed)
Subjective:    Patient ID: Lindsay Henderson, female    DOB: 05-18-1955, 58 y.o.   MRN: [id number]  HPI Pt returns for f/u of diabetes mellitus:  DM type: 2 Dx'ed: 7096 Complications: none Therapy: 4 orals GDM: never DKA: never Severe hypoglycemia: never Pancreatitis: never Other info:   Interval history: She could not afford the invokana.  no cbg record, but states cbg's are in the mid to high-100's.  pt states she feels well in general. Past Medical History  Diagnosis Date  . Allergic rhinitis   . Asthma   . Anomalous atrioventricular excitation   . History of supraventricular tachycardia   . Hypercholesteremia   . Type II or unspecified type diabetes mellitus without mention of complication, not stated as uncontrolled   . GERD (gastroesophageal reflux disease)   . Gastritis   . Diverticulosis of colon   . Adenomatous colon polyp 11/2005  . Nephrolithiasis   . Renal cyst   . Vitamin D deficiency   . Somatic dysfunction   . Anxiety     no per pt  . Kidney stones     Past Surgical History  Procedure Laterality Date  . Tubal ligation    . Cardiac electrophysiology mapping and ablation    . Colonoscopy      History   Social History  . Marital Status: Married    Spouse Name: N/A    Number of Children: 4  . Years of Education: N/A   Occupational History  . [employer]    Social History Main Topics  . Smoking status: Never Smoker   . Smokeless tobacco: Never Used  . Alcohol Use: No  . Drug Use: No  . Sexual Activity: Not on file   Other Topics Concern  . Not on file   Social History Narrative  . No narrative on file    Current Outpatient Prescriptions on File Prior to Visit  Medication Sig Dispense Refill  . albuterol (PROAIR HFA) 108 (90 BASE) MCG/ACT inhaler Inhale 2 puffs into the lungs every 6 (six) hours as needed.  1 Inhaler  11  . aspirin 81 MG tablet Take 81 mg by mouth daily.      . beclomethasone (QVAR) 80 MCG/ACT inhaler Inhale  1 puff into the lungs 2 (two) times daily.  1 Inhaler  6  . cetirizine (ZYRTEC) 10 MG tablet Take 10 mg by mouth daily as needed. For allergies      . doxycycline (VIBRA-TABS) 100 MG tablet Take 1 tablet (100 mg total) by mouth daily.  10 tablet  0  . esomeprazole (NEXIUM) 20 MG capsule Take 20 mg by mouth daily at 12 noon.      . fluticasone (FLONASE) 50 MCG/ACT nasal spray Place 2 sprays into both nostrils daily.      Marland Kitchen linagliptin (TRADJENTA) 5 MG TABS tablet Take 1 tablet (5 mg total) by mouth daily.  30 tablet  11  . metFORMIN (GLUCOPHAGE) 500 MG tablet Take 2 tablets (1,000 mg total) by mouth 2 (two) times daily with a meal.  120 tablet  11  . rosuvastatin (CRESTOR) 10 MG tablet Take 1 tablet (10 mg total) by mouth daily.  30 tablet  11  . Vitamin D, Ergocalciferol, (DRISDOL) 50000 UNITS CAPS capsule TAKE 1 CAPSULE BY MOUTH EVERY 7 DAYS  4 capsule  1   No current facility-administered medications on file prior to visit.    Allergies  Allergen Reactions  . Parlodel [Bromocriptine Mesylate] Nausea  Only    nausea  . Peanut-Containing Drug Products Hives, Itching and Swelling  . Penicillins     REACTION: rash and sob  . Pioglitazone Diarrhea    tremor  . Sulfa Antibiotics Hives  . Zithromax [Azithromycin] Nausea And Vomiting    Family History  Problem Relation Age of Onset  . Diabetes Father   . Hypertension Father   . Hyperlipidemia Father   . Breast cancer Mother   . Colon cancer Neg Hx   . Esophageal cancer Neg Hx   . Stomach cancer Neg Hx   . Rectal cancer Neg Hx     BP 138/90  Pulse 97  Temp(Src) 98.3 F (36.8 C) (Oral)  Wt 148 lb (67.132 kg)  SpO2 97%    Review of Systems     Objective:   Physical Exam VITAL SIGNS:  See vs page GENERAL: no distress Pulses: dorsalis pedis intact bilat.   Feet: no deformity.  no edema Skin:  no ulcer on the feet.  normal color and temp. Neuro: sensation is intact to touch on the feet   Lab Results  Component Value  Date   HGBA1C 9.2* 10/02/2013       Assessment & Plan:  DM: moderate exacerbation.  We discussed insulin.  Pt says she would like to learn more.   Noncompliance with cbg recording: I'll work around this as best I can  Patient is advised the following: Patient Instructions  please call [phone] (old office), to get an appointment with a new primary doctor.   blood tests are being requested for you today.  We'll contact you with results. Please change the nateglinide to glimepiride. i have sent a prescription to your pharmacy. Please come back for a follow-up appointment in 2 weeks.   Please see Vaughan Basta the same day, to discuss insulin.   check your blood sugar once a day.  vary the time of day when you check, between before the 3 meals, and at bedtime.  also check if you have symptoms of your blood sugar being too high or too low.  please keep a record of the readings and bring it to your next appointment here.  You can write it on any piece of paper.  please call us sooner if your blood sugar goes below 70, or if you have a lot of readings over 200.

## 2013-10-15 ENCOUNTER — Encounter: Payer: No Typology Code available for payment source | Admitting: Nutrition

## 2013-10-15 ENCOUNTER — Ambulatory Visit: Payer: No Typology Code available for payment source | Admitting: Endocrinology

## 2013-10-16 ENCOUNTER — Ambulatory Visit: Payer: No Typology Code available for payment source | Admitting: Endocrinology

## 2013-10-22 ENCOUNTER — Encounter: Payer: No Typology Code available for payment source | Admitting: Nutrition

## 2013-11-03 ENCOUNTER — Encounter (HOSPITAL_COMMUNITY): Payer: Self-pay

## 2013-11-04 ENCOUNTER — Encounter (HOSPITAL_COMMUNITY): Payer: Self-pay

## 2013-11-04 ENCOUNTER — Ambulatory Visit (HOSPITAL_COMMUNITY)
Admission: RE | Admit: 2013-11-04 | Discharge: 2013-11-04 | Disposition: A | Discharge status: Home / Self Care | Payer: No Typology Code available for payment source | Source: Ambulatory Visit | Attending: Obstetrics and Gynecology | Admitting: Obstetrics and Gynecology

## 2013-11-04 VITALS — BP 142/94 | Ht 62.0 in | Wt 149.4 lb

## 2013-11-04 DIAGNOSIS — Z1239 Encounter for other screening for malignant neoplasm of breast: Secondary | ICD-10-CM

## 2013-11-04 NOTE — Patient Instructions (Signed)
Explained to Lindsay Henderson that she did not need a Pap smear today due to last Pap smear was in November 2013 per patient. Let her know [facility] will cover Pap smears every 3 years unless has a history of abnormal Pap smears. Referred patient to [facility] for a right breast biopsy per recommendation. Appointment scheduled for Thursday, November 06, 2013 at 0800. Patient aware of appointment and will be there. Lindsay Henderson verbalized understanding.  Kamilo Och, Arvil Chaco, RN 1:49 PM

## 2013-11-04 NOTE — Progress Notes (Signed)
Patient referred to [facility] by [facility] due to recommending a right breast biopsy. Screening mammogram completed 10/23/2013 and right breast diagnostic mammogram completed 10/29/2013.  Pap Smear: Pap smear not completed today. Last Pap smear was in November 2013 at Dr. Marcheta Grammes office and normal per patient. Per patient has no history of an abnormal Pap smear. No Pap smear results in EPIC.  Physical exam: Breasts Breasts symmetrical. No skin abnormalities bilateral breasts. No nipple retraction bilateral breasts. No nipple discharge bilateral breasts. No lymphadenopathy. No lumps palpated bilateral breasts. No complaints of pain or tenderness on exam. Referred patient to [facility] for a right breast biopsy per recommendation. Appointment scheduled for Thursday, November 06, 2013 at 0800.    Pelvic/Bimanual No Pap smear completed today since last Pap smear was in November 2013 per patient. Pap smear not indicated per [facility] guidelines.

## 2013-11-10 ENCOUNTER — Encounter (HOSPITAL_COMMUNITY): Payer: Self-pay

## 2013-11-16 ENCOUNTER — Other Ambulatory Visit: Payer: Self-pay | Admitting: Pulmonary Disease

## 2013-11-18 ENCOUNTER — Other Ambulatory Visit: Payer: Self-pay | Admitting: Pulmonary Disease

## 2013-11-20 ENCOUNTER — Other Ambulatory Visit: Payer: Self-pay

## 2013-11-20 ENCOUNTER — Ambulatory Visit (HOSPITAL_BASED_OUTPATIENT_CLINIC_OR_DEPARTMENT_OTHER): Payer: No Typology Code available for payment source

## 2013-11-20 VITALS — BP 120/70 | HR 82 | Temp 98.4°F | Resp 18 | Ht 61.75 in | Wt 150.8 lb

## 2013-11-20 DIAGNOSIS — Z0000 Encounter for general adult medical examination without abnormal findings: Secondary | ICD-10-CM

## 2013-11-20 LAB — GLUCOSE (CC13): Glucose: 157 mg/dl — ABNORMAL HIGH (ref 70–140)

## 2013-11-20 LAB — LIPID PANEL
CHOLESTEROL: 234 mg/dL — AB (ref 0–200)
HDL: 54 mg/dL (ref >39)
LDL Cholesterol: 158 mg/dL — ABNORMAL HIGH (ref 0–99)
Total CHOL/HDL Ratio: 4.3 Ratio
Triglycerides: 109 mg/dL (ref <150)
VLDL: 22 mg/dL (ref 0–40)

## 2013-11-20 LAB — HEMOGLOBIN A1C
Hgb A1c MFr Bld: 8 % — ABNORMAL HIGH (ref <5.7)
MEAN PLASMA GLUCOSE: 183 mg/dL — AB (ref <117)

## 2013-11-20 NOTE — Progress Notes (Signed)
Patient is a new patient to the [facility] program and is currently a [facility] patient effective 11/04/2013.   Clinical Measurements: Patient is 5 ft. 1.75 inches, weight 150.8 lbs, waist circumference 32 inches, and hip circumference 39.5 inches.   Medical History: Patient states she has a history of high cholesterol and doesn't take any medications presently for it. Per patient doctor gave her Crestor but developed pain so told not to take and take omega -3's instead of Crestor. Patient does not have a history of hypertension. Per patient has a history of  being  diabetic  Patient states is on Metformin  And another medication. Per patient no diagnosed history of coronary heart disease, heart attack, heart failure, stroke/TIA, vascular disease or congenital heart defects.   Blood Pressure, Self-measurement: Patient states that has never had a need to check Blood Pressure.  Nutrition Assessment: Patient stated that only eats about three cups of fruit a day. Patient states she loves vegetables eating at least 3 cups daily. Patient states does eat 3 or more ounces of whole grains daily. Patient does eat two or more servings of fish weekly. Patient states she does not drink more than 36 ounces or 450 calories of beverages with added sugars weekly. Patient stated she does not watch her salt intake.   Physical Activity Assessment: Patient stated she does around 285 minutes of moderate exercise weekly including walking and household chores. Patient stated she does not do any vigorous physical activity on a regular basis.    Smoking Status: Patient states she has never smoked and is not around smoke in closed areas..  Quality of Life Assessment: In assessing patient's Physical quality of life she stated that out of the past 30 days that she has felt her health was good all of them.  Patient also stated that in the past 30 days that her mental health was good for 30 days including stress, depression and  problems with emotions. , work or recreation, Nothing has kept her from doing her usual activities  Plan: Lab work will be done today including a lipid panel, glucose and Hgb A1C. Patient will be referred to [facility] for follow up if has any abnormal lab work. Patient wants to work on nutrition and activity.

## 2013-11-20 NOTE — Patient Instructions (Signed)
Discussed health assessment with patient.She will be called with results of lab work and we will then discussed any further follow up the patient needs. Patient verbalized understanding. 

## 2013-12-23 ENCOUNTER — Telehealth: Payer: Self-pay

## 2013-12-23 NOTE — Telephone Encounter (Signed)
Called to inform about lab work from 11/20/13. I informed patient: cholesterol- 234, HDL- 54, LDL- 97, triglycerides - 158, Bld Glucose -157 and HBG-A1C - 8. Patient stated that had been into portal to check labs. Set up health Coaching for December 17 at [facility] PM at [facility] for nutrition, activity and cholesterol.

## 2013-12-25 ENCOUNTER — Ambulatory Visit: Payer: Self-pay

## 2013-12-25 NOTE — Progress Notes (Signed)
Patient returns today for Health Coaching regarding Nutrition for her  AIC, cholesterol, LDL's and activity.   NUTRITION: Patient and I reviewed lab results. A1C down from 9.2 to 8.0. Discussed increasing fiber in diet, reading labels and serving sizes.  Discussed watching carbohydrates, how to count them, serving sizes and number of carbs per day allowance. Patient reviewed BMI (27. ) chart which was in overweight range. Per patient she does moderate activity and does not eat much. Discussed the importance of serving sizes, counting carbohydrates and increasing fiber in diet. Patient went over 1,600 cal diet and we broke it down to the number of servings she could have. Discussed eating more  Omega three fish choices for meals. Patient, husband and I starting doing assessments in [employer] [facility]. Patient is eating fruit in syrup and sugar cereal. In showing tips and setting goals patient saw what needed to be doing.   Patient received and reviewed the following handouts: My Plate, Carb counting Menu, Diabetes Meal Plan Basics,1600 calorie meal plan My Plate tips on Increasing grains, 8 way to improve Cholesterol, [employer] Book (cook book &. [employer] Activity). Gave patient measuring cup to measure serving sizes and demonstrated the serving sizes. Patient received water bottle and magnet.  ACTIVITY:  Discussed activity and walking Patient discussed other activity. Reviewed [employer] Activity booklet and received a stretch band. Received pedometer and encourage to walk 7,500 steps three times a week.    PLAN: Patient will increase amount of walking per day and add other exercises to plan, Decrease carbohydrates in diet. Lose weight. Follow up times three per phone unless needs and can call. Return to weigh in and recheck A1C.

## 2013-12-25 NOTE — Patient Instructions (Signed)
Patient will follow 1600 calorie diet plan. Will review all handouts and exercise/activity book. Will increase exercise and use pedometer. Will stop eating fruit in syrup. Work through the [facility] [employer]. Will measure portion sizes. Will call if has any questions. Will call patient in three weeks. Patient verbalized understanding.

## 2014-01-27 ENCOUNTER — Telehealth: Payer: Self-pay

## 2014-01-27 NOTE — Telephone Encounter (Signed)
Called to FU and see in wanted to do a weigh in. Left message to return call.

## 2014-01-29 ENCOUNTER — Other Ambulatory Visit: Payer: Self-pay | Admitting: Endocrinology

## 2014-02-12 ENCOUNTER — Ambulatory Visit (INDEPENDENT_AMBULATORY_CARE_PROVIDER_SITE_OTHER): Payer: No Typology Code available for payment source | Admitting: Internal Medicine

## 2014-02-12 ENCOUNTER — Other Ambulatory Visit (INDEPENDENT_AMBULATORY_CARE_PROVIDER_SITE_OTHER): Payer: No Typology Code available for payment source

## 2014-02-12 ENCOUNTER — Encounter: Payer: Self-pay | Admitting: Internal Medicine

## 2014-02-12 VITALS — BP 140/80 | HR 94 | Temp 98.3°F | Wt 149.0 lb

## 2014-02-12 DIAGNOSIS — IMO0002 Reserved for concepts with insufficient information to code with codable children: Secondary | ICD-10-CM

## 2014-02-12 DIAGNOSIS — E1165 Type 2 diabetes mellitus with hyperglycemia: Secondary | ICD-10-CM

## 2014-02-12 DIAGNOSIS — E7800 Pure hypercholesterolemia, unspecified: Secondary | ICD-10-CM

## 2014-02-12 DIAGNOSIS — J309 Allergic rhinitis, unspecified: Secondary | ICD-10-CM | POA: Diagnosis not present

## 2014-02-12 LAB — LIPID PANEL
CHOL/HDL RATIO: 4
CHOLESTEROL: 228 mg/dL — AB (ref 0–200)
HDL: 53.2 mg/dL (ref >39.00)
LDL Cholesterol: 150 mg/dL — ABNORMAL HIGH (ref 0–99)
NonHDL: 174.8
TRIGLYCERIDES: 125 mg/dL (ref 0.0–149.0)
VLDL: 25 mg/dL (ref 0.0–40.0)

## 2014-02-12 LAB — COMPREHENSIVE METABOLIC PANEL
ALBUMIN: 4.4 g/dL (ref 3.5–5.2)
ALK PHOS: 86 U/L (ref 39–117)
ALT: 12 U/L (ref 0–35)
AST: 14 U/L (ref 0–37)
BILIRUBIN TOTAL: 0.3 mg/dL (ref 0.2–1.2)
BUN: 13 mg/dL (ref 6–23)
CO2: 28 mEq/L (ref 19–32)
Calcium: 9.9 mg/dL (ref 8.4–10.5)
Chloride: 104 mEq/L (ref 96–112)
Creatinine, Ser: 0.63 mg/dL (ref 0.40–1.20)
GFR: 124.19 mL/min (ref >60.00)
Glucose, Bld: 74 mg/dL (ref 70–99)
Potassium: 4 mEq/L (ref 3.5–5.1)
Sodium: 139 mEq/L (ref 135–145)
TOTAL PROTEIN: 7.2 g/dL (ref 6.0–8.3)

## 2014-02-12 LAB — HEMOGLOBIN A1C: Hgb A1c MFr Bld: 7.9 % — ABNORMAL HIGH (ref 4.6–6.5)

## 2014-02-12 MED ORDER — LEVOCETIRIZINE DIHYDROCHLORIDE 5 MG PO TABS: 5.0000 mg | ORAL_TABLET | Freq: Every evening | ORAL | Status: DC

## 2014-02-12 MED ORDER — VITAMIN D (ERGOCALCIFEROL) 1.25 MG (50000 UNIT) PO CAPS: ORAL_CAPSULE | ORAL | Status: DC

## 2014-02-12 NOTE — Patient Instructions (Addendum)
We will send in a stronger medicine for the allergies called xyzal. Take 1 pill a day, stop taking the allegra when you get it.   You can use the flonase twice a day to help with the allergies more. You do not need any antibiotics right now.   We will have you come back for labs tomorrow and don't forget to call Dr. Loanne Drilling to come back in. We will call you with the results.   We have given you a sheet with the exercises for the dizziness, please try to do it once a day.   Diabetes and Exercise Exercising regularly is important. It is not just about losing weight. It has many health benefits, such as:  Improving your overall fitness, flexibility, and endurance.  Increasing your bone density.  Helping with weight control.  Decreasing your body fat.  Increasing your muscle strength.  Reducing stress and tension.  Improving your overall health. People with diabetes who exercise gain additional benefits because exercise:  Reduces appetite.  Improves the body's use of blood sugar (glucose).  Helps lower or control blood glucose.  Decreases blood pressure.  Helps control blood lipids (such as cholesterol and triglycerides).  Improves the body's use of the hormone insulin by:  Increasing the body's insulin sensitivity.  Reducing the body's insulin needs.  Decreases the risk for heart disease because exercising:  Lowers cholesterol and triglycerides levels.  Increases the levels of good cholesterol (such as high-density lipoproteins [HDL]) in the body.  Lowers blood glucose levels. YOUR ACTIVITY PLAN  Choose an activity that you enjoy and set realistic goals. Your health care provider or diabetes educator can help you make an activity plan that works for you. Exercise regularly as directed by your health care provider. This includes:  Performing resistance training twice a week such as push-ups, sit-ups, lifting weights, or using resistance bands.  Performing 150 minutes  of cardio exercises each week such as walking, running, or playing sports.  Staying active and spending no more than 90 minutes at one time being inactive. Even short bursts of exercise are good for you. Three 10-minute sessions spread throughout the day are just as beneficial as a single 30-minute session. Some exercise ideas include:  Taking the dog for a walk.  Taking the stairs instead of the elevator.  Dancing to your favorite song.  Doing an exercise video.  Doing your favorite exercise with a friend. RECOMMENDATIONS FOR EXERCISING WITH TYPE 1 OR TYPE 2 DIABETES   Check your blood glucose before exercising. If blood glucose levels are greater than 240 mg/dL, check for urine ketones. Do not exercise if ketones are present.  Avoid injecting insulin into areas of the body that are going to be exercised. For example, avoid injecting insulin into:  The arms when playing tennis.  The legs when jogging.  Keep a record of:  Food intake before and after you exercise.  Expected peak times of insulin action.  Blood glucose levels before and after you exercise.  The type and amount of exercise you have done.  Review your records with your health care provider. Your health care provider will help you to develop guidelines for adjusting food intake and insulin amounts before and after exercising.  If you take insulin or oral hypoglycemic agents, watch for signs and symptoms of hypoglycemia. They include:  Dizziness.  Shaking.  Sweating.  Chills.  Confusion.  Drink plenty of water while you exercise to prevent dehydration or heat stroke. Body water is  lost during exercise and must be replaced.  Talk to your health care provider before starting an exercise program to make sure it is safe for you. Remember, almost any type of activity is better than none. Document Released: 03/18/2003 Document Revised: 05/12/2013 Document Reviewed: 06/04/2012 [facility] Patient Information  2015 [place], [place]. This information is not intended to replace advice given to you by your health care provider. Make sure you discuss any questions you have with your health care provider.

## 2014-02-12 NOTE — Progress Notes (Signed)
Pre visit review using our clinic review tool, if applicable. No additional management support is needed unless otherwise documented below in the visit note. 

## 2014-02-12 NOTE — Progress Notes (Signed)
   Subjective:    Patient ID: Lindsay Henderson, female    DOB: 01-Aug-1955, 59 y.o.   MRN: [id number]  HPI The patient is a 59 YO female who is a new patient coming in with sinus problems. She has had them for about 2 weeks. She was having some facial tenderness but it is gone now. She is having some mild dizziness which she gets with her sinuses. Denies associated fevers, chills. She is having mild cough (non-productive) but no SOB or wheezing. She is having nasal drainage which is persistent and green drainage. She is using flonase at home and allegra to help. She has also tried some sudafed for congestion which helps well. She also has DM type 2 (uncontrolled, no complications, on metformin, trajenta, amaryl).   PMH, [place], social history, PSH, allergies, medications, problem list reviewed and updated.   Review of Systems  Constitutional: Negative for fever, activity change, appetite change, fatigue and unexpected weight change.  HENT: Positive for congestion, postnasal drip and rhinorrhea. Negative for ear discharge, ear pain, facial swelling, hearing loss, sinus pressure and sore throat.   Eyes: Negative.   Respiratory: Positive for cough. Negative for chest tightness, shortness of breath and wheezing.   Cardiovascular: Negative for chest pain, palpitations and leg swelling.  Gastrointestinal: Negative.   Musculoskeletal: Negative.   Skin: Negative.   Neurological: Negative.   Psychiatric/Behavioral: Negative.       Objective:   Physical Exam  Constitutional: She is oriented to person, place, and time. She appears well-developed and well-nourished.  HENT:  Head: Normocephalic and atraumatic.  Right Ear: External ear normal.  Left Ear: External ear normal.  Oropharynx with erythema, no purulent discharge. Nares with swelling and redness no crusting.   Eyes: EOM are normal.  Neck: Normal range of motion.  Cardiovascular: Normal rate and regular rhythm.   Pulmonary/Chest: Effort  normal and breath sounds normal. No respiratory distress. She has no wheezes. She has no rales.  Abdominal: Soft. Bowel sounds are normal. She exhibits no distension. There is no tenderness. There is no rebound.  Musculoskeletal: She exhibits no edema.  Lymphadenopathy:    She has no cervical adenopathy.  Neurological: She is alert and oriented to person, place, and time. Coordination normal.  Skin: Skin is warm and dry.   Filed Vitals:   02/12/14 1606  BP: 140/80  Pulse: 94  Temp: 98.3 F (36.8 C)  TempSrc: Oral  Weight: 149 lb (67.586 kg)  SpO2: 97%      Assessment & Plan:  Patient declines pneumonia shot and flu shot today.

## 2014-02-12 NOTE — Assessment & Plan Note (Addendum)
Has not been seen or treatment adjusted for this in some time. She was supposed to return to endocrinology for insulin discussion but never went. Have asked her to return to endocrinology. Check HgA1c, BMP, lipid panel today. Last HgA1c uncontrolled but she claims to have lost some weight since then which may have helped. Likely will need regimen titration. Declined pneumonia shot today.

## 2014-02-12 NOTE — Assessment & Plan Note (Signed)
Last LDL not at goal, on crestor 10 mg daily without side effects and likely will need titration of therapy.

## 2014-02-12 NOTE — Assessment & Plan Note (Signed)
She will continue with saline rinses. She can increase flonase to BID for next week. Start xyzal since she has tried and failed allegra, claritin, zyrtec OTC. No indication for antibiotics at this time. No wheezing or noises in the lungs.

## 2014-02-23 ENCOUNTER — Telehealth: Payer: Self-pay | Admitting: Internal Medicine

## 2014-02-23 MED ORDER — ATORVASTATIN CALCIUM 20 MG PO TABS: 20.0000 mg | ORAL_TABLET | Freq: Every day | ORAL | Status: DC

## 2014-02-23 NOTE — Telephone Encounter (Signed)
Left message for patient to call me back. 

## 2014-02-23 NOTE — Telephone Encounter (Signed)
Pt continues to have headaches and sinus pressure. Wants to talk to nurse.

## 2014-02-23 NOTE — Telephone Encounter (Signed)
Have sent in the cholesterol medicine. We may need to see her back to see if she needs an antibiotic for her sinuses. Has she been doing flonase twice a day, xyzal daily, and therob sinus rinses?

## 2014-02-23 NOTE — Telephone Encounter (Signed)
Patient says she has taken the meds you prescribed. She has also done the exercises you asked her to do. She says she is still having headaches and sinus pressure. She wants to know if you can prescribe anything else. She also says that we were supposed to call in a cholesterol medication for her. Please advise, thanks.

## 2014-02-23 NOTE — Telephone Encounter (Signed)
Patient will schedule an office visit and will go pick up medication from the pharmacy.

## 2014-02-24 ENCOUNTER — Ambulatory Visit: Payer: No Typology Code available for payment source | Admitting: Internal Medicine

## 2014-02-26 ENCOUNTER — Ambulatory Visit (INDEPENDENT_AMBULATORY_CARE_PROVIDER_SITE_OTHER): Payer: No Typology Code available for payment source | Admitting: Internal Medicine

## 2014-02-26 ENCOUNTER — Encounter: Payer: Self-pay | Admitting: Internal Medicine

## 2014-02-26 VITALS — BP 132/80 | HR 89 | Temp 98.2°F | Resp 12 | Ht 62.0 in | Wt 149.0 lb

## 2014-02-26 DIAGNOSIS — J309 Allergic rhinitis, unspecified: Secondary | ICD-10-CM

## 2014-02-26 MED ORDER — DOXYCYCLINE HYCLATE 100 MG PO TABS: 100.0000 mg | ORAL_TABLET | Freq: Two times a day (BID) | ORAL | Status: DC

## 2014-02-26 NOTE — Progress Notes (Signed)
   Subjective:    Patient ID: Lindsay Henderson, female    DOB: 11/21/1955, 59 y.o.   MRN: [id number]  HPI The patient is a 59 YO female who is coming in for her sinuses. She has been having the same problems for the last 2-3 weeks. Last time we started her on xyzal and doubled her flonase dosage which she has done. She has also been using nose sprays. They have helped but she is still having some pain in her face and headaches. She is still having nasal congestion. She denies fevers or chills. Overall the sinuses feel like they have plateaued and are not improving anymore. She rates them a 5/10 pain and pressure all the time and 7-8/10 when they get bad.   Review of Systems  Constitutional: Negative for fever, activity change, appetite change, fatigue and unexpected weight change.  HENT: Positive for congestion, facial swelling, postnasal drip and sinus pressure. Negative for ear discharge, ear pain and sore throat.   Respiratory: Positive for cough. Negative for chest tightness, shortness of breath and wheezing.        Mild, improved  Cardiovascular: Negative for chest pain, palpitations and leg swelling.  Gastrointestinal: Negative.   Musculoskeletal: Negative.   Neurological: Negative.       Objective:   Physical Exam  Constitutional: She appears well-developed and well-nourished.  HENT:  Head: Normocephalic and atraumatic.  Oropharynx red, turbinates red and inflamed.   Eyes: EOM are normal.  Neck: Normal range of motion.  Cardiovascular: Normal rate and regular rhythm.   Pulmonary/Chest: Effort normal and breath sounds normal. No respiratory distress. She has no wheezes. She has no rales.  Abdominal: Soft. Bowel sounds are normal.   Filed Vitals:   02/26/14 0829  BP: 132/80  Pulse: 89  Temp: 98.2 F (36.8 C)  TempSrc: Oral  Resp: 12  Height: 5\' 2"  (1.575 m)  Weight: 149 lb (67.586 kg)  SpO2: 95%      Assessment & Plan:

## 2014-02-26 NOTE — Assessment & Plan Note (Signed)
At this time likely bacterial invasion. Given her allergy to penicillins will use doxycycline 100 mg BID for 14 days. She has tolerated in the past.

## 2014-02-26 NOTE — Patient Instructions (Signed)
We will give you an antibiotic called doxycycline. Take 1 pill twice a day for the next 2 weeks until it is gone. This should help to take care of the congestion in the sinuses.   If you want, it may be worthwhile to try to allergy center to see if they can help you from getting all these problems with the sinuses.   Keep going with the nose spray and the medicine (xyzal) for the allergies.

## 2014-02-26 NOTE — Progress Notes (Signed)
Pre visit review using our clinic review tool, if applicable. No additional management support is needed unless otherwise documented below in the visit note. 

## 2014-03-06 ENCOUNTER — Other Ambulatory Visit: Payer: Self-pay | Admitting: Endocrinology

## 2014-03-06 ENCOUNTER — Telehealth: Payer: Self-pay

## 2014-03-06 NOTE — Telephone Encounter (Signed)
Patient called to follow up because saw were I had called her in December.Patient stated that was doing good. She has had sinus and ear problem a lot. Patient stated that had brought Cholesterol and LDL's down some. Per patient doctor started on Lipitor. Patient stated that really enjoyed following the [place]. Per patient the program has helped a lot. Last doctors visit her Hbg A1C was down again  To 7,9. Patient stated that would like to do another face to face visit. Scheduled another Health Coaching visit. (10 minutes).

## 2014-03-12 ENCOUNTER — Ambulatory Visit (INDEPENDENT_AMBULATORY_CARE_PROVIDER_SITE_OTHER): Payer: No Typology Code available for payment source | Admitting: Endocrinology

## 2014-03-12 ENCOUNTER — Ambulatory Visit: Payer: No Typology Code available for payment source

## 2014-03-12 ENCOUNTER — Encounter: Payer: Self-pay | Admitting: Endocrinology

## 2014-03-12 VITALS — BP 130/84 | HR 100 | Temp 98.4°F | Ht 62.0 in | Wt 149.0 lb

## 2014-03-12 DIAGNOSIS — Z789 Other specified health status: Secondary | ICD-10-CM

## 2014-03-12 DIAGNOSIS — E1165 Type 2 diabetes mellitus with hyperglycemia: Secondary | ICD-10-CM

## 2014-03-12 DIAGNOSIS — IMO0002 Reserved for concepts with insufficient information to code with codable children: Secondary | ICD-10-CM

## 2014-03-12 MED ORDER — LINAGLIPTIN 5 MG PO TABS: 5.0000 mg | ORAL_TABLET | Freq: Every day | ORAL | Status: DC

## 2014-03-12 MED ORDER — GLIMEPIRIDE 2 MG PO TABS: 2.0000 mg | ORAL_TABLET | Freq: Every day | ORAL | Status: DC

## 2014-03-12 NOTE — Progress Notes (Signed)
Patient returns today for more Health Coaching regarding nutrition and exercise related to diabetes and cholesterol.  NUTRITION: Patient has brought her A1C down to 7.9. Patient stated that weight has been steady at 149. Patient stated that is still using regular sugar in cooking. Per patient is also tasting and eating cakes and sweets that daughter brings into the house. Discussed Stevia and Splenda brown sugar for cooking. Patient stated that craves sweets and can not stop with one piece. Discussed option could do. Discussed S&S of hpo- and hyperglycemia. Per patient yes she has increase hunger when blood sugar is up. Discussed that needed to keep down so would not have those cravings and needed to have multiple plans to deal with problem when it occurs. Discussed some alternate sweet like things that could make but needs to remember that desserts are carbohydrates usually (cake, fruit, etc.). Went over 1200 calorie meal plan with patient. Patient has maintained weight and needs to lose between 5 to 10% of body weight. Percentage was calculated into pounds and given to patient. Patient stated that was not measuring serving sizes. Patient was informed that really needed to read labels and measure servings. That doing these things it would help her to lose weight and bring A1C down more. Patient received and reviewed the following handouts: Carb counting and meal planning; Small Steps. Big Rewards; 1200 calorie meal plan; Signs and symptoms of hyper- and hypoglycemia; know your numbers. Patient received magnets, water bottle and snack container.  PHYSICAL ACTIVITY: Will increase walking over next couple of weeks to at least to 30 minutes 5 times a week.  PLAN: Will cut regular sugar out of patients diet, Will lose at least 5% of body Weight, Increase exercise. Will follow up with patient in a month.

## 2014-03-12 NOTE — Patient Instructions (Signed)
Patient will follow 1200 calorie meal plan. Will review all handouts. Will measure portion sizes and read labels of products. Will increase exercise to 5 times a week.Will change over to sugar substitutes. Will call if has any questions. Will call patient in three weeks. Patient verbalized understanding.

## 2014-03-12 NOTE — Patient Instructions (Addendum)
Please resume the tradjenta, and reduce the glimepiride to 2 mg each morning. Please come back for a follow-up appointment in 2 months.  check your blood sugar once a day.  vary the time of day when you check, between before the 3 meals, and at bedtime.  also check if you have symptoms of your blood sugar being too high or too low.  please keep a record of the readings and bring it to your next appointment here.  You can write it on any piece of paper.  please call us sooner if your blood sugar goes below 70, or if you have a lot of readings over 200.

## 2014-03-12 NOTE — Progress Notes (Signed)
Subjective:    Patient ID: Lindsay Henderson, female    DOB: 05/08/1955, 59 y.o.   MRN: [id number]  HPI Pt returns for f/u of diabetes mellitus: DM type: 2 Dx'ed: 5361 Complications: none Therapy: 3 oral meds GDM: never DKA: never Severe hypoglycemia: never Pancreatitis: never Other: she has never been on insulin; she could not afford the invokana. Interval history: She is overdue for f/u.  pt states she feels well in general, except with hypoglycemia.  no cbg record, but states cbg's vary from 60-200.   Past Medical History  Diagnosis Date  . Allergic rhinitis   . Asthma   . Anomalous atrioventricular excitation   . History of supraventricular tachycardia   . Hypercholesteremia   . Type II or unspecified type diabetes mellitus without mention of complication, not stated as uncontrolled   . GERD (gastroesophageal reflux disease)   . Gastritis   . Diverticulosis of colon   . Adenomatous colon polyp 11/2005  . Nephrolithiasis   . Renal cyst   . Vitamin D deficiency   . Somatic dysfunction   . Anxiety     no per pt  . Kidney stones     Past Surgical History  Procedure Laterality Date  . Tubal ligation    . Cardiac electrophysiology mapping and ablation    . Colonoscopy      History   Social History  . Marital Status: Married    Spouse Name: N/A  . Number of Children: 4  . Years of Education: N/A   Occupational History  . [employer]    Social History Main Topics  . Smoking status: Never Smoker   . Smokeless tobacco: Never Used  . Alcohol Use: No  . Drug Use: No  . Sexual Activity: Not on file   Other Topics Concern  . Not on file   Social History Narrative    Current Outpatient Prescriptions on File Prior to Visit  Medication Sig Dispense Refill  . albuterol (PROAIR HFA) 108 (90 BASE) MCG/ACT inhaler Inhale 2 puffs into the lungs every 6 (six) hours as needed. 1 Inhaler 11  . aspirin 81 MG tablet Take 81 mg by mouth daily.    Marland Kitchen atorvastatin  (LIPITOR) 20 MG tablet Take 1 tablet (20 mg total) by mouth daily. 90 tablet 1  . beclomethasone (QVAR) 80 MCG/ACT inhaler Inhale 1 puff into the lungs 2 (two) times daily. 1 Inhaler 6  . cetirizine (ZYRTEC) 10 MG tablet Take 10 mg by mouth daily as needed. For allergies    . doxycycline (VIBRA-TABS) 100 MG tablet Take 1 tablet (100 mg total) by mouth 2 (two) times daily. 28 tablet 0  . esomeprazole (NEXIUM) 20 MG capsule Take 20 mg by mouth daily at 12 noon.    . fluticasone (FLONASE) 50 MCG/ACT nasal spray Place 2 sprays into both nostrils daily.    Marland Kitchen levocetirizine ([name]) 5 MG tablet Take 1 tablet (5 mg total) by mouth every evening. 30 tablet 0  . metFORMIN (GLUCOPHAGE) 500 MG tablet Take 2 tablets (1,000 mg total) by mouth 2 (two) times daily with a meal. 120 tablet 11  . Vitamin D, Ergocalciferol, (DRISDOL) 50000 UNITS CAPS capsule TAKE 1 CAPSULE BY MOUTH EVERY 7 DAYS 4 capsule 0   No current facility-administered medications on file prior to visit.    Allergies  Allergen Reactions  . Parlodel [Bromocriptine Mesylate] Nausea Only    nausea  . Peanut-Containing Drug Products Hives, Itching and Swelling  .  Penicillins     REACTION: rash and sob  . Pioglitazone Diarrhea    tremor  . Sulfa Antibiotics Hives  . Zithromax [Azithromycin] Nausea And Vomiting    Family History  Problem Relation Age of Onset  . Diabetes Father   . Hypertension Father   . Hyperlipidemia Father   . Breast cancer Mother   . Colon cancer Neg Hx   . Esophageal cancer Neg Hx   . Stomach cancer Neg Hx   . Rectal cancer Neg Hx     BP 130/84 mmHg  Pulse 100  Temp(Src) 98.4 F (36.9 C) (Oral)  Ht 5\' 2"  (1.575 m)  Wt 149 lb (67.586 kg)  BMI 27.25 kg/m2  SpO2 97%  Review of Systems Denies LOC and weight change.    Objective:   Physical Exam VITAL SIGNS:  See vs page GENERAL: no distress Pulses: dorsalis pedis intact bilat.   MSK: no deformity of the feet CV: no leg edema Skin:  no ulcer on  the feet.  normal color and temp on the feet. Neuro: sensation is intact to touch on the feet.    Lab Results  Component Value Date   HGBA1C 7.9* 02/12/2014      Assessment & Plan:  DM: mild exacerbation. hypoglycemia, due to amaryl, worse.   Patient is advised the following: Patient Instructions  Please resume the tradjenta, and reduce the glimepiride to 2 mg each morning. Please come back for a follow-up appointment in 2 months.  check your blood sugar once a day.  vary the time of day when you check, between before the 3 meals, and at bedtime.  also check if you have symptoms of your blood sugar being too high or too low.  please keep a record of the readings and bring it to your next appointment here.  You can write it on any piece of paper.  please call us sooner if your blood sugar goes below 70, or if you have a lot of readings over 200.

## 2014-03-17 ENCOUNTER — Telehealth: Payer: Self-pay

## 2014-03-17 NOTE — Telephone Encounter (Signed)
Pt was suppose to receive a coupon for Tradjenta when she was in the office on Thursday. Her husband stopped by to pick it up but there are none available. Pt would like a cheaper option because it is too expensive. Please advise

## 2014-03-18 NOTE — Telephone Encounter (Signed)
Left message to advise pt of note

## 2014-03-18 NOTE — Telephone Encounter (Signed)
We do have the cards.  Please come by to pick 1 up.  Sorry fort he mixup.

## 2014-04-06 ENCOUNTER — Other Ambulatory Visit: Payer: Self-pay | Admitting: Internal Medicine

## 2014-04-09 ENCOUNTER — Telehealth: Payer: Self-pay

## 2014-04-09 NOTE — Telephone Encounter (Signed)
Rec'd fax from pharmacy to request PA for Levocetirizine Dihydrochloride; PA provided via Cover my meds. Per Cover my meds rep, this was denied.   Requested fax denial letter; Call placed directly to benefit manager at [phone].   Nurse stated to benefit manager patient had tried zyrtec. Was told that patient needs to fail one other otc med, as Claritin or allergra.  Call placed to the patient for call back regarding history of OTC meds.

## 2014-04-10 NOTE — Telephone Encounter (Signed)
The patient confirmed she has taken Zyrtec and [name] 180mg  but her allergies are still not resolved. She has been on these otc meds and they are no longer effective; [name] was advised by Dr. Lenna Gilford  Call placed to the pharmacy; [phone] and spoke with pa representative;  Levocertirizine approved by [place] from today's date 04/10/2014 until 04/09/2017. Fax rec'd with auth/ Will fax over approval letter to Pharmacy (f:[phone]) and called the patient at  [phone] to let her know.

## 2014-04-13 ENCOUNTER — Telehealth (HOSPITAL_COMMUNITY): Payer: Self-pay | Admitting: *Deleted

## 2014-04-13 ENCOUNTER — Telehealth: Payer: Self-pay | Admitting: Endocrinology

## 2014-04-13 NOTE — Telephone Encounter (Signed)
Patient stated that she need a prescription for methimazole 500 mg, she is going out of town and need it today, please advise

## 2014-04-13 NOTE — Telephone Encounter (Signed)
Pt needed a refill of Metformin 500 mg sent not Methimazole. Rx sent per pt's request.

## 2014-04-13 NOTE — Telephone Encounter (Signed)
Telephoned patient at home # and advised was time for 6 month follow up at [facility]. Patient will need to call and schedule appointment. Patient voiced understanding.

## 2014-04-22 ENCOUNTER — Encounter: Payer: Self-pay | Admitting: Gastroenterology

## 2014-05-25 ENCOUNTER — Other Ambulatory Visit: Payer: Self-pay

## 2014-05-25 MED ORDER — METFORMIN HCL 500 MG PO TABS: ORAL_TABLET | ORAL | Status: DC

## 2014-06-03 ENCOUNTER — Encounter (HOSPITAL_COMMUNITY): Payer: Self-pay | Admitting: *Deleted

## 2014-06-11 ENCOUNTER — Ambulatory Visit (AMBULATORY_SURGERY_CENTER): Payer: Self-pay | Admitting: *Deleted

## 2014-06-11 VITALS — Ht 62.0 in | Wt 150.0 lb

## 2014-06-11 DIAGNOSIS — Z86010 Personal history of colonic polyps: Secondary | ICD-10-CM

## 2014-06-11 MED ORDER — NA SULFATE-K SULFATE-MG SULF 17.5-3.13-1.6 GM/177ML PO SOLN: ORAL | Status: DC

## 2014-06-11 NOTE — Progress Notes (Signed)
No egg or soy allergies Not on home 02 No previous anesthesia complications Emmi video emailed to home email address. No diet or weight loss meds

## 2014-06-16 ENCOUNTER — Encounter: Payer: Self-pay | Admitting: Gastroenterology

## 2014-06-22 ENCOUNTER — Telehealth: Payer: Self-pay | Admitting: Gastroenterology

## 2014-06-22 NOTE — Telephone Encounter (Signed)
Pt states her prep with insurance is 89.00 and she cannot afford. Instructed pt she can come get a sample.  She will pick up today  Medication Samples have been provided to the patient.  Drug name: suorep  Qty: 1  LOT: [id number]  Exp.Date: 70-18  The patient has been instructed regarding the correct time, dose, and frequency of taking this medication, including desired effects and most common side effects.   Lindsay Henderson 1:24 PM 06/22/2014

## 2014-06-22 NOTE — Telephone Encounter (Signed)
Tried pt at 1242 pm with no answer. [place]. Lelan Pons PV

## 2014-06-22 NOTE — Telephone Encounter (Signed)
949 am called number , no answer, LMTRC.  Lelan Pons PV

## 2014-06-23 ENCOUNTER — Encounter: Payer: Self-pay | Admitting: Gastroenterology

## 2014-06-25 ENCOUNTER — Ambulatory Visit (AMBULATORY_SURGERY_CENTER): Payer: No Typology Code available for payment source | Admitting: Gastroenterology

## 2014-06-25 ENCOUNTER — Encounter: Payer: Self-pay | Admitting: Gastroenterology

## 2014-06-25 VITALS — BP 121/71 | HR 80 | Temp 97.4°F | Resp 22 | Ht 62.0 in | Wt 150.0 lb

## 2014-06-25 DIAGNOSIS — D128 Benign neoplasm of rectum: Secondary | ICD-10-CM

## 2014-06-25 DIAGNOSIS — K621 Rectal polyp: Secondary | ICD-10-CM

## 2014-06-25 DIAGNOSIS — Z86010 Personal history of colonic polyps: Secondary | ICD-10-CM | POA: Diagnosis not present

## 2014-06-25 DIAGNOSIS — D129 Benign neoplasm of anus and anal canal: Secondary | ICD-10-CM

## 2014-06-25 LAB — GLUCOSE, CAPILLARY
GLUCOSE-CAPILLARY: 146 mg/dL — AB (ref 65–99)
Glucose-Capillary: 134 mg/dL — ABNORMAL HIGH (ref 65–99)

## 2014-06-25 MED ORDER — SODIUM CHLORIDE 0.9 % IV SOLN: 500.0000 mL | INTRAVENOUS | Status: DC

## 2014-06-25 NOTE — Progress Notes (Signed)
  [facility] [facility] Post-op Note  Patient: Lindsay Henderson  Procedure(s) Performed: colonoscopy  Patient Location: [facility] - Recovery Area  Anesthesia Type: Deep Sedation/Propofol  Level of Consciousness: awake, oriented and patient cooperative  Airway and Oxygen Therapy: Patient Spontanous Breathing  Post-op Pain: none  Post-op Assessment:  Post-op Vital signs reviewed, Patient's Cardiovascular Status Stable, Respiratory Function Stable, Patent Airway, No signs of Nausea or vomiting and Pain level controlled  Post-op Vital Signs: Reviewed and stable  Complications: No apparent anesthesia complications  Alisi Lupien E 9:58 AM

## 2014-06-25 NOTE — Patient Instructions (Addendum)
YOU HAD AN ENDOSCOPIC PROCEDURE TODAY AT [facility]:   Refer to the procedure report that was given to you for any specific questions about what was found during the examination.  If the procedure report does not answer your questions, please call your gastroenterologist to clarify.  If you requested that your care partner not be given the details of your procedure findings, then the procedure report has been included in a sealed envelope for you to review at your convenience later.  YOU SHOULD EXPECT: Some feelings of bloating in the abdomen. Passage of more gas than usual.  Walking can help get rid of the air that was put into your GI tract during the procedure and reduce the bloating. If you had a lower endoscopy (such as a colonoscopy or flexible sigmoidoscopy) you may notice spotting of blood in your stool or on the toilet paper. If you underwent a bowel prep for your procedure, you may not have a normal bowel movement for a few days.  Please Note:  You might notice some irritation and congestion in your nose or some drainage.  This is from the oxygen used during your procedure.  There is no need for concern and it should clear up in a day or so.  SYMPTOMS TO REPORT IMMEDIATELY:   Following lower endoscopy (colonoscopy or flexible sigmoidoscopy):  Excessive amounts of blood in the stool  Significant tenderness or worsening of abdominal pains  Swelling of the abdomen that is new, acute  Fever of 100F or higher    For urgent or emergent issues, a gastroenterologist can be reached at any hour by calling [phone].   DIET: Your first meal following the procedure should be a small meal and then it is ok to progress to your normal diet. Heavy or fried foods are harder to digest and may make you feel nauseous or bloated.  Likewise, meals heavy in dairy and vegetables can increase bloating.  Drink plenty of fluids but you should avoid alcoholic beverages for 24  hours.  ACTIVITY:  You should plan to take it easy for the rest of today and you should NOT DRIVE or use heavy machinery until tomorrow (because of the sedation medicines used during the test).    FOLLOW UP: Our staff will call the number listed on your records the next business day following your procedure to check on you and address any questions or concerns that you may have regarding the information given to you following your procedure. If we do not reach you, we will leave a message.  However, if you are feeling well and you are not experiencing any problems, there is no need to return our call.  We will assume that you have returned to your regular daily activities without incident.  If any biopsies were taken you will be contacted by phone or by letter within the next 1-3 weeks.  Please call us at [phone] if you have not heard about the biopsies in 3 weeks.    SIGNATURES/CONFIDENTIALITY: You and/or your care partner have signed paperwork which will be entered into your electronic medical record.  These signatures attest to the fact that that the information above on your After Visit Summary has been reviewed and is understood.  Full responsibility of the confidentiality of this discharge information lies with you and/or your care-partner.   Information on polyps ,diverticulosis &high fiber diet given to you today

## 2014-06-25 NOTE — Progress Notes (Signed)
Called to room to assist during endoscopic procedure.  Patient ID and intended procedure confirmed with present staff. Received instructions for my participation in the procedure from the performing physician.  

## 2014-06-25 NOTE — Op Note (Signed)
[place]  [address] [place], [address]   COLONOSCOPY PROCEDURE REPORT  PATIENT: Lindsay, Henderson  MR#: [id number] BIRTHDATE: December 26, 1955 , 75  yrs. old GENDER: female ENDOSCOPIST: Ladene Artist, MD, [facility] PROCEDURE DATE:  06/25/2014 PROCEDURE:   Colonoscopy, surveillance and Colonoscopy with biopsy First Screening Colonoscopy - Avg.  risk and is 50 yrs.  old or older - No.  Prior Negative Screening - Now for repeat screening. N/A  History of Adenoma - Now for follow-up colonoscopy & has been > or = to 3 yrs.  Yes hx of adenoma.  Has been 3 or more years since last colonoscopy.  Polyps removed today? Yes ASA CLASS:   Class II INDICATIONS:Surveillance due to prior colonic neoplasia and PH Colon Adenoma. MEDICATIONS: Monitored anesthesia care and Propofol 200 mg IV DESCRIPTION OF PROCEDURE:   After the risks benefits and alternatives of the procedure were thoroughly explained, informed consent was obtained.  The digital rectal exam revealed no abnormalities of the rectum.   The LB [id number]  endoscope was introduced through the anus and advanced to the cecum, which was identified by both the appendix and ileocecal valve. No adverse events experienced.   The quality of the prep was good.  (Suprep was used)  The instrument was then slowly withdrawn as the colon was fully examined. Estimated blood loss is zero unless otherwise noted in this procedure report.    COLON FINDINGS: Two sessile polyps measuring 4 mm in size were found in the rectum.  Polypectomies were performed with cold forceps. The resection was complete, the polyp tissue was completely retrieved and sent to histology.   There was mild diverticulosis noted in the sigmoid colon.   The examination was otherwise normal. Retroflexed views revealed no abnormalities. The time to cecum = 2.5 Withdrawal time = 13.9   The scope was withdrawn and the procedure completed. COMPLICATIONS: There  were no immediate complications.  ENDOSCOPIC IMPRESSION: 1.   Two sessile polyps in the rectum; polypectomies performed with cold forceps 2.   Mild diverticulosis in the sigmoid colon 3.   The examination was otherwise normal  RECOMMENDATIONS: 1.  Await pathology results 2.  High fiber diet with liberal fluid intake. 3.  Continue current colorectal screening recommendations for "routine risk" patients with a repeat colonoscopy in 10 years.  eSigned:  Ladene Artist, MD, [facility] 06/25/2014 9:58 AM

## 2014-06-29 ENCOUNTER — Telehealth: Payer: Self-pay

## 2014-06-29 NOTE — Telephone Encounter (Signed)
  Follow up Call-  Call back number 06/25/2014  Post procedure Call Back phone  # [phone]  Permission to leave phone message Yes     Patient questions:  Do you have a fever, pain , or abdominal swelling? No. Pain Score  0 *  Have you tolerated food without any problems? Yes.    Have you been able to return to your normal activities? Yes.    Do you have any questions about your discharge instructions: Diet   No. Medications  No. Follow up visit  No.  Do you have questions or concerns about your Care? No.  Actions: * If pain score is 4 or above: No action needed, pain <4.

## 2014-07-02 ENCOUNTER — Encounter: Payer: Self-pay | Admitting: Gastroenterology

## 2014-07-09 ENCOUNTER — Telehealth: Payer: Self-pay

## 2014-07-09 NOTE — Telephone Encounter (Signed)
Called to follow up on how was doing following meal plan and exercising. Left my phone number and asked patient to return my call.

## 2014-07-22 ENCOUNTER — Telehealth: Payer: Self-pay

## 2014-07-22 NOTE — Telephone Encounter (Signed)
Patient returned call from 6/30. Patient stated that was exercising. Trying to eat like she should. Discussed insurance she has but is considered underinsured by [employer]. Patient asked when and if could come back.and discussed her recent colonoscopy. Patient stated that had her goals that she is working on. Will do follow up assessment in one month.(15 minutes)

## 2014-08-18 ENCOUNTER — Telehealth: Payer: Self-pay

## 2014-08-18 NOTE — Telephone Encounter (Signed)
Called to do follow up assessment and left message to return my call.

## 2014-08-18 NOTE — Telephone Encounter (Signed)
This is a follow up Assessment following Health Coaching with [facility]. .  Medication Status : Patient states is not taking medication for high cholesterol or hypertension. Patient states takes medicine for diabetes.   Blood Pressure, Self-measurement: Patient states has no reason to check Blood pressure.  Nutrition Assessment: Patient stated that eats 2 fruits every day. Patient states she eats 4 to 5 servings of vegetables a day. Per Patient eats 3 or more ounces of whole grains daily. Patient stated does eat two or more servings of fish weekly.  Patient states she does not drink more than 36 ounces or 450 calories of beverages with added sugars weekly. Patient stated she does watch her salt intake.   Physical Activity Assessment: Patient stated uses her treadmill for 40 minutes 3 days per week. Per patient is doing around 540 minutes of moderate activity and no vigorous per week.  Smoking Status: Patient had never smoked and is not exposed to smoke.  Quality of Life Assessment: In assessing patient's quality of life she stated that out of the past 30 days that she has felt her health was not good for 2 to 3 days. Patient also stated that in the past 30 days that her mental health was good including stress, depression and problems with emotions for all days. Patient did state that out of the past 30 days she felt her physical or mental health had kept her from doing her usual activities including self-care, work or recreation for around 2 to 3 days.

## 2014-10-28 ENCOUNTER — Other Ambulatory Visit (INDEPENDENT_AMBULATORY_CARE_PROVIDER_SITE_OTHER): Payer: 59

## 2014-10-28 ENCOUNTER — Encounter: Payer: Self-pay | Admitting: Internal Medicine

## 2014-10-28 ENCOUNTER — Ambulatory Visit (INDEPENDENT_AMBULATORY_CARE_PROVIDER_SITE_OTHER): Payer: 59 | Admitting: Internal Medicine

## 2014-10-28 VITALS — BP 136/70 | HR 108 | Temp 98.4°F | Resp 14 | Ht 62.0 in | Wt 150.0 lb

## 2014-10-28 DIAGNOSIS — E559 Vitamin D deficiency, unspecified: Secondary | ICD-10-CM | POA: Diagnosis not present

## 2014-10-28 DIAGNOSIS — Z794 Long term (current) use of insulin: Secondary | ICD-10-CM

## 2014-10-28 DIAGNOSIS — E1165 Type 2 diabetes mellitus with hyperglycemia: Secondary | ICD-10-CM

## 2014-10-28 DIAGNOSIS — R300 Dysuria: Secondary | ICD-10-CM | POA: Diagnosis not present

## 2014-10-28 DIAGNOSIS — IMO0002 Reserved for concepts with insufficient information to code with codable children: Secondary | ICD-10-CM

## 2014-10-28 DIAGNOSIS — E118 Type 2 diabetes mellitus with unspecified complications: Secondary | ICD-10-CM

## 2014-10-28 DIAGNOSIS — R309 Painful micturition, unspecified: Secondary | ICD-10-CM | POA: Diagnosis not present

## 2014-10-28 LAB — COMPREHENSIVE METABOLIC PANEL
ALT: 12 U/L (ref 0–35)
AST: 12 U/L (ref 0–37)
Albumin: 4 g/dL (ref 3.5–5.2)
Alkaline Phosphatase: 80 U/L (ref 39–117)
BUN: 12 mg/dL (ref 6–23)
CHLORIDE: 103 meq/L (ref 96–112)
CO2: 28 mEq/L (ref 19–32)
CREATININE: 0.75 mg/dL (ref 0.40–1.20)
Calcium: 9.2 mg/dL (ref 8.4–10.5)
GFR: 101.31 mL/min (ref >60.00)
GLUCOSE: 279 mg/dL — AB (ref 70–99)
POTASSIUM: 3.6 meq/L (ref 3.5–5.1)
Sodium: 140 mEq/L (ref 135–145)
TOTAL PROTEIN: 7 g/dL (ref 6.0–8.3)
Total Bilirubin: 0.4 mg/dL (ref 0.2–1.2)

## 2014-10-28 LAB — LIPID PANEL
Cholesterol: 227 mg/dL — ABNORMAL HIGH (ref 0–200)
HDL: 50.4 mg/dL (ref >39.00)
LDL Cholesterol: 150 mg/dL — ABNORMAL HIGH (ref 0–99)
NONHDL: 177.06
Total CHOL/HDL Ratio: 5
Triglycerides: 133 mg/dL (ref 0.0–149.0)
VLDL: 26.6 mg/dL (ref 0.0–40.0)

## 2014-10-28 LAB — HEMOGLOBIN A1C: Hgb A1c MFr Bld: 7.7 % — ABNORMAL HIGH (ref 4.6–6.5)

## 2014-10-28 MED ORDER — NITROFURANTOIN MACROCRYSTAL 100 MG PO CAPS: 100.0000 mg | ORAL_CAPSULE | Freq: Two times a day (BID) | ORAL | Status: DC

## 2014-10-28 NOTE — Progress Notes (Signed)
Pre visit review using our clinic review tool, if applicable. No additional management support is needed unless otherwise documented below in the visit note. 

## 2014-10-28 NOTE — Patient Instructions (Signed)
We will send in a medicine for urinary tract infection called macrobid. Take 1 pill twice a day for 1 week.   If you are still having spotting after you finish the antibiotic we will need you to go back to an Ob/Gyn to get that checked out. It can be normal to have some blood with a urinary infection.   Urinary Tract Infection Urinary tract infections (UTIs) can develop anywhere along your urinary tract. Your urinary tract is your body's drainage system for removing wastes and extra water. Your urinary tract includes two kidneys, two ureters, a bladder, and a urethra. Your kidneys are a pair of bean-shaped organs. Each kidney is about the size of your fist. They are located below your ribs, one on each side of your spine. CAUSES Infections are caused by microbes, which are microscopic organisms, including fungi, viruses, and bacteria. These organisms are so small that they can only be seen through a microscope. Bacteria are the microbes that most commonly cause UTIs. SYMPTOMS  Symptoms of UTIs may vary by age and gender of the patient and by the location of the infection. Symptoms in young women typically include a frequent and intense urge to urinate and a painful, burning feeling in the bladder or urethra during urination. Older women and men are more likely to be tired, shaky, and weak and have muscle aches and abdominal pain. A fever may mean the infection is in your kidneys. Other symptoms of a kidney infection include pain in your back or sides below the ribs, nausea, and vomiting. DIAGNOSIS To diagnose a UTI, your caregiver will ask you about your symptoms. Your caregiver will also ask you to provide a urine sample. The urine sample will be tested for bacteria and white blood cells. White blood cells are made by your body to help fight infection. TREATMENT  Typically, UTIs can be treated with medication. Because most UTIs are caused by a bacterial infection, they usually can be treated with the  use of antibiotics. The choice of antibiotic and length of treatment depend on your symptoms and the type of bacteria causing your infection. HOME CARE INSTRUCTIONS  If you were prescribed antibiotics, take them exactly as your caregiver instructs you. Finish the medication even if you feel better after you have only taken some of the medication.  Drink enough water and fluids to keep your urine clear or pale yellow.  Avoid caffeine, tea, and carbonated beverages. They tend to irritate your bladder.  Empty your bladder often. Avoid holding urine for long periods of time.  Empty your bladder before and after sexual intercourse.  After a bowel movement, women should cleanse from front to back. Use each tissue only once. SEEK MEDICAL CARE IF:   You have back pain.  You develop a fever.  Your symptoms do not begin to resolve within 3 days. SEEK IMMEDIATE MEDICAL CARE IF:   You have severe back pain or lower abdominal pain.  You develop chills.  You have nausea or vomiting.  You have continued burning or discomfort with urination. MAKE SURE YOU:   Understand these instructions.  Will watch your condition.  Will get help right away if you are not doing well or get worse.   This information is not intended to replace advice given to you by your health care provider. Make sure you discuss any questions you have with your health care provider.   Document Released: 10/05/2004 Document Revised: 09/16/2014 Document Reviewed: 02/03/2011 Elsevier Interactive Patient Education 2016  [employer].

## 2014-10-30 DIAGNOSIS — R300 Dysuria: Secondary | ICD-10-CM | POA: Insufficient documentation

## 2014-10-30 NOTE — Assessment & Plan Note (Signed)
Checking HgA1c as she was not well controlled before and has not followed up with her diabetes since February.

## 2014-10-30 NOTE — Assessment & Plan Note (Signed)
Treat as UTI with macrobid. If she is still having the spotting after resolution of her UTI symptoms needs to see her gynecologist for the post-menopausal bleeding.

## 2014-10-30 NOTE — Progress Notes (Signed)
   Subjective:    Patient ID: Lindsay Henderson, female    DOB: 1955-03-03, 59 y.o.   MRN: [id number]  HPI The patient is a 59 YO female coming in for dysuria and frequency. She is having some suprapubic discomfort off and on for the last 1 week or so. Tried increasing her water intake and drinking cranberry juice which helped but the symptoms returned. Denies fevers or chills. Denies back pain. Also having some spotting in her undergarments and not sure if there is blood when she wipes. Is menopausal and prior to this no spotting. Has not seen a gynecologist in some time.  Review of Systems  Constitutional: Negative.   Respiratory: Negative.   Gastrointestinal: Positive for abdominal pain. Negative for nausea, diarrhea, constipation and abdominal distention.  Genitourinary: Positive for dysuria, urgency and frequency.       Some spotting on undergarments  Musculoskeletal: Negative.   Psychiatric/Behavioral: Negative.       Objective:   Physical Exam  Constitutional: She appears well-developed and well-nourished.  HENT:  Head: Normocephalic and atraumatic.  Eyes: EOM are normal.  Neck: Normal range of motion.  Cardiovascular: Normal rate and regular rhythm.   Pulmonary/Chest: Effort normal and breath sounds normal. No respiratory distress. She has no wheezes.  Abdominal: Soft. Bowel sounds are normal. She exhibits no distension. There is tenderness. There is no rebound.  Mild suprapubic tenderness  Neurological: Coordination normal.  Skin: Skin is warm and dry.   Filed Vitals:   10/28/14 1112  BP: 136/70  Pulse: 108  Temp: 98.4 F (36.9 C)  TempSrc: Oral  Resp: 14  Height: 5\' 2"  (1.575 m)  Weight: 150 lb (68.04 kg)  SpO2: 97%      Assessment & Plan:

## 2014-10-30 NOTE — Assessment & Plan Note (Signed)
Recheck levels and likely stop her high dose vitamin D supplementation.

## 2014-11-02 ENCOUNTER — Other Ambulatory Visit: Payer: Self-pay

## 2014-11-02 MED ORDER — METFORMIN HCL 500 MG PO TABS: ORAL_TABLET | ORAL | Status: DC

## 2014-11-14 IMAGING — CR DG CHEST 2V
2 series · 2 of 2 positions shown · non-contrast
Comparison: [DATE]

CLINICAL DATA: Cough and congestion with asthma and wheezing

EXAM:
CHEST  2 VIEW

[view not recorded (1 of 2)]
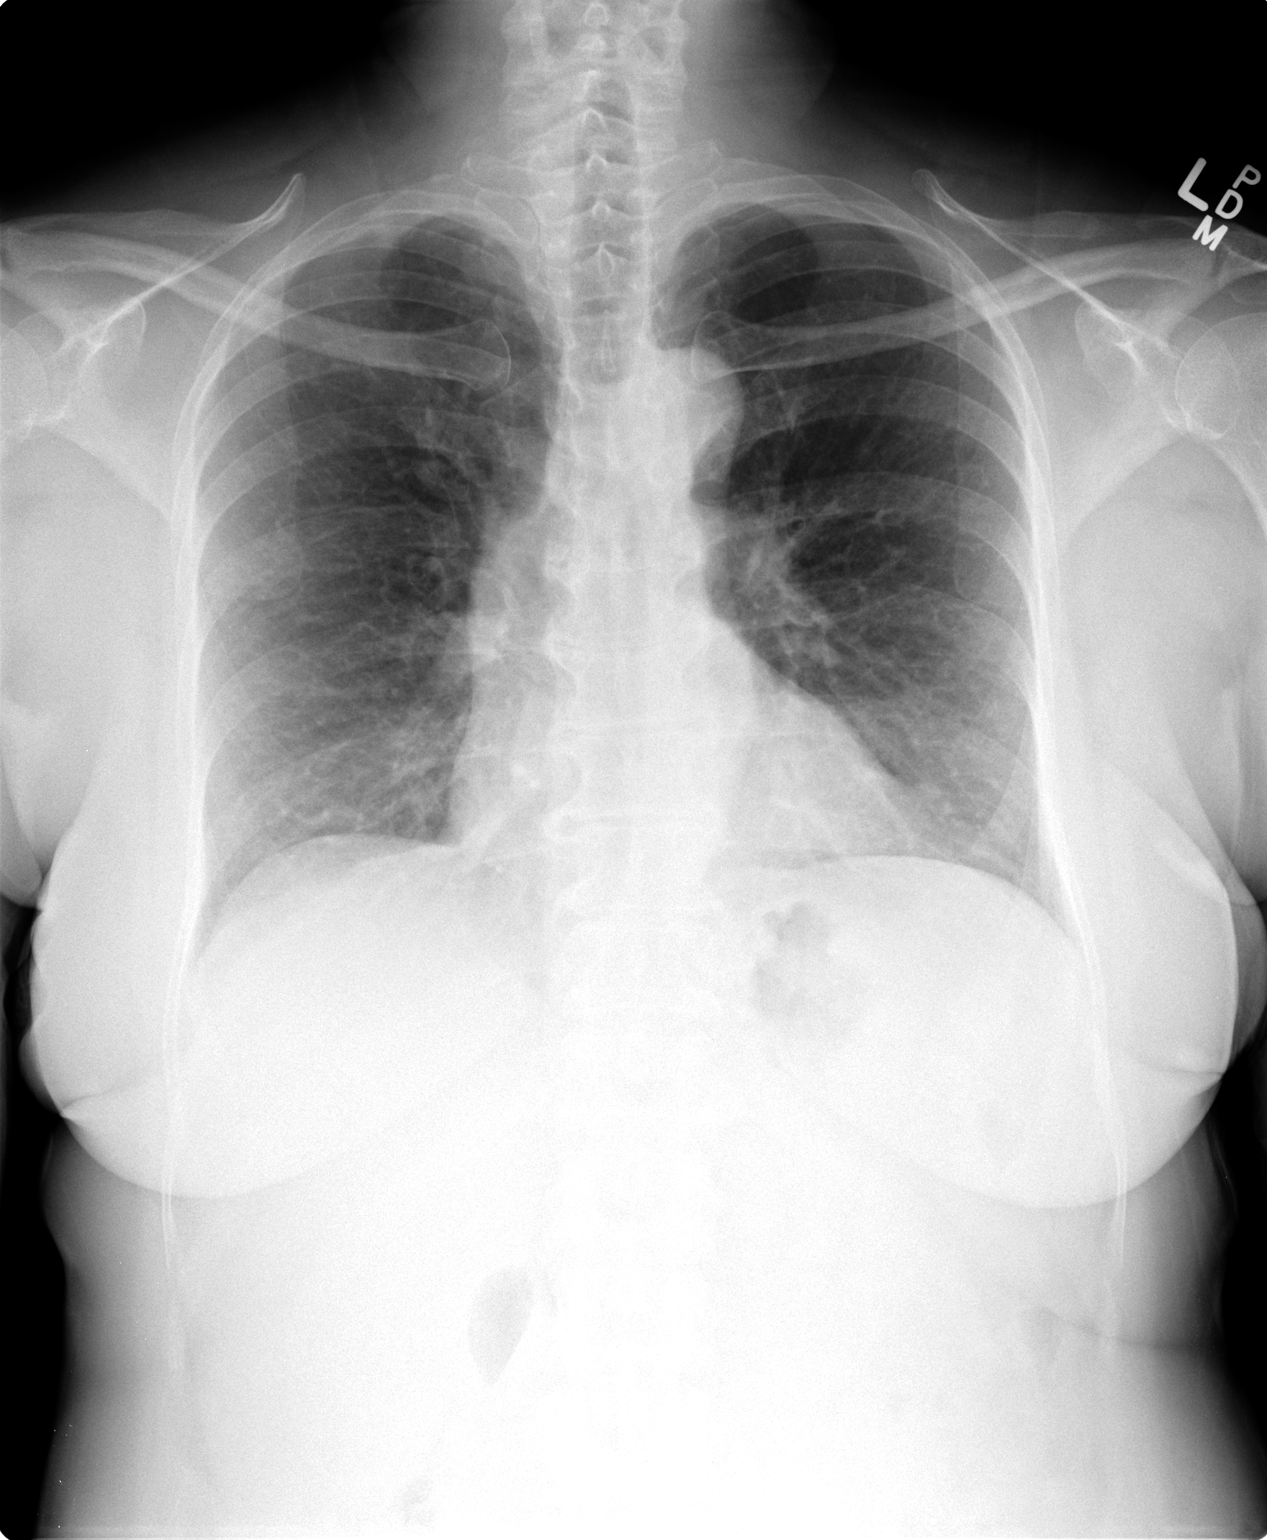

[view not recorded (2 of 2)]
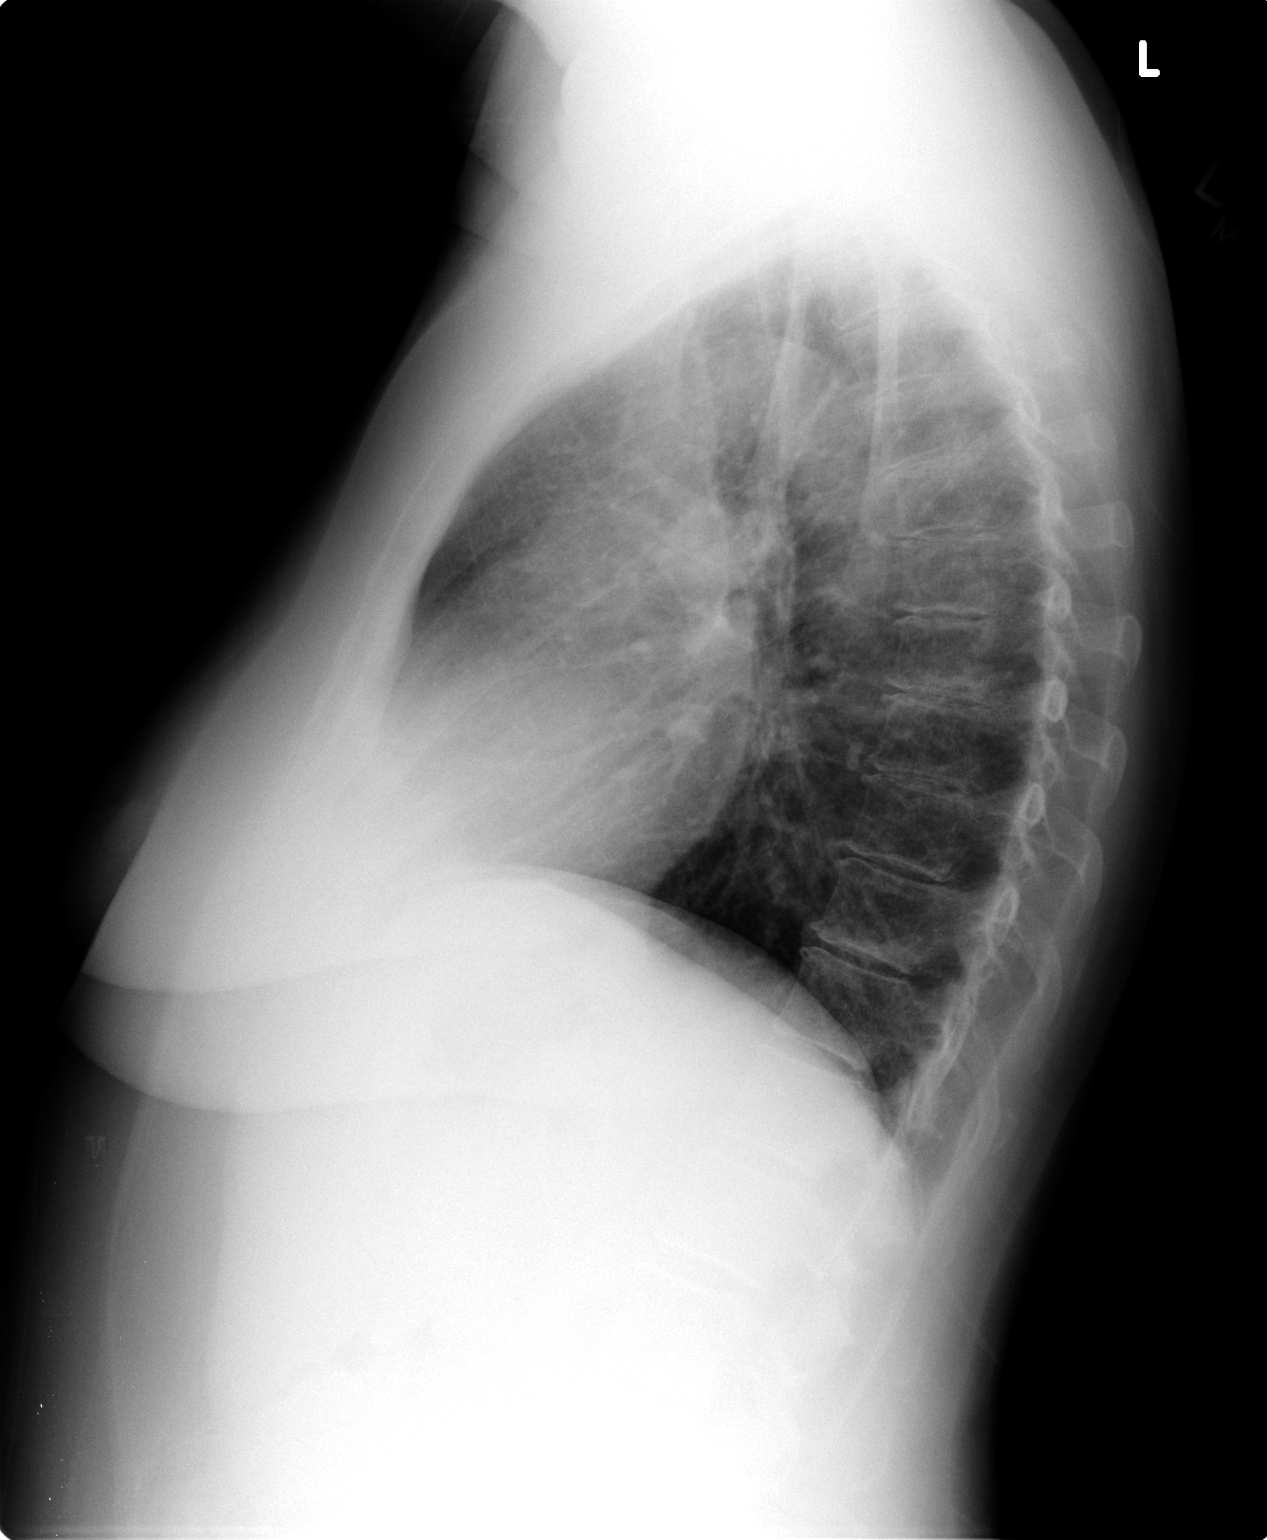

[2 of 2 positions shown; findings below may reference images not displayed]

FINDINGS: Heart size and vascular pattern are normal. There is mild uncoiling
of the aorta. There is no consolidation or effusion. There is normal
degree inflation.
IMPRESSION: No active cardiopulmonary disease.

## 2014-11-18 ENCOUNTER — Telehealth: Payer: Self-pay | Admitting: Internal Medicine

## 2014-11-18 DIAGNOSIS — R300 Dysuria: Secondary | ICD-10-CM

## 2014-11-18 NOTE — Telephone Encounter (Signed)
I will send to Dr. Sharlet Salina. If patient is not able to wait, she must go to an urgent care, or ER. Patient aware.

## 2014-11-18 NOTE — Telephone Encounter (Signed)
Patient was in a few weeks ago with a uti and it did clear up but now its back. She's wondering if you can send another refill for the medication.

## 2014-11-19 ENCOUNTER — Other Ambulatory Visit (INDEPENDENT_AMBULATORY_CARE_PROVIDER_SITE_OTHER): Payer: 59

## 2014-11-19 DIAGNOSIS — R300 Dysuria: Secondary | ICD-10-CM | POA: Diagnosis not present

## 2014-11-19 LAB — URINALYSIS, ROUTINE W REFLEX MICROSCOPIC
BILIRUBIN URINE: NEGATIVE
HGB URINE DIPSTICK: NEGATIVE
Ketones, ur: NEGATIVE
LEUKOCYTES UA: NEGATIVE
NITRITE: NEGATIVE
RBC / HPF: NONE SEEN (ref 0–?)
Specific Gravity, Urine: 1.025 (ref 1.000–1.030)
TOTAL PROTEIN, URINE-UPE24: NEGATIVE
Urobilinogen, UA: 0.2 (ref 0.0–1.0)
pH: 5.5 (ref 5.0–8.0)

## 2014-11-19 NOTE — Telephone Encounter (Signed)
She would need to come leave a urine sample in the lab since she was not able to do one at the last visit where she was treated.

## 2014-11-19 NOTE — Telephone Encounter (Signed)
Spoke with patient and she does not have an infection. There was sugar in the urine. Per Dr. Sharlet Salina drink plenty of water and don't eat a lot of food with sugar.

## 2014-11-26 ENCOUNTER — Ambulatory Visit (HOSPITAL_COMMUNITY)
Admission: RE | Admit: 2014-11-26 | Discharge: 2014-11-26 | Disposition: A | Discharge status: Home / Self Care | Payer: 59 | Source: Ambulatory Visit | Attending: Obstetrics and Gynecology | Admitting: Obstetrics and Gynecology

## 2014-11-26 ENCOUNTER — Encounter (HOSPITAL_COMMUNITY): Payer: Self-pay

## 2014-11-26 VITALS — BP 118/76 | Temp 98.1°F | Ht 62.0 in | Wt 149.0 lb

## 2014-11-26 DIAGNOSIS — Z01419 Encounter for gynecological examination (general) (routine) without abnormal findings: Secondary | ICD-10-CM

## 2014-11-26 NOTE — Patient Instructions (Addendum)
Educational materials on breast self awareness given. Explained to Lindsay Henderson that [place] will cover Pap smears and HPV typing  every 5 years unless has a history of abnormal Pap smears. Referred patient to [facility] for bilateral diagnostic mammogram per recommendation. Appointment scheduled for Monday, December 07, 2014 at 0815. Patient aware of appointment and will be there. Let patient know will follow up with her within the next couple weeks with results of Pap smear by phone. Lindsay Henderson verbalized understanding.  Cheronda Erck, Arvil Chaco, RN 9:03 AM

## 2014-11-26 NOTE — Progress Notes (Signed)
No complaints today.  Pap Smear:  Pap smear completed today. Last Pap smear was in November 2013 at Dr. Marcheta Grammes office and normal per patient. Per patient has no history of an abnormal Pap smear. No Pap smear results in EPIC.  Physical exam: Breasts Breasts symmetrical. No skin abnormalities bilateral breasts. No nipple retraction bilateral breasts. No nipple discharge bilateral breasts. No lymphadenopathy. No lumps palpated bilateral breasts. No complaints of pain or tenderness on exam. Referred patient to [facility] for bilateral diagnostic mammogram per recommendation. Appointment scheduled for Monday, December 07, 2014 at 0815.           Pelvic/Bimanual   Ext Genitalia No lesions, no swelling and no discharge observed on external genitalia.         Vagina Vagina pink and normal texture. No lesions or discharge observed in vagina.          Cervix Cervix is present. Cervix pink and of normal texture. No discharge observed.     Uterus Uterus is present and palpable. Uterus in normal position and normal size.        Adnexae Bilateral ovaries present and palpable. No tenderness on palpation.          Rectovaginal No rectal exam completed today since patient had no rectal complaints. No skin abnormalities observed on exam.

## 2014-11-30 LAB — CYTOLOGY - PAP

## 2014-12-10 ENCOUNTER — Ambulatory Visit: Payer: 59

## 2014-12-10 ENCOUNTER — Other Ambulatory Visit: Payer: 59

## 2014-12-10 ENCOUNTER — Ambulatory Visit (HOSPITAL_BASED_OUTPATIENT_CLINIC_OR_DEPARTMENT_OTHER): Payer: 59

## 2014-12-10 VITALS — BP 116/68 | HR 88 | Temp 97.9°F | Resp 16 | Ht 62.0 in | Wt 147.3 lb

## 2014-12-10 DIAGNOSIS — Z0000 Encounter for general adult medical examination without abnormal findings: Secondary | ICD-10-CM

## 2014-12-10 LAB — LIPID PANEL
CHOLESTEROL: 218 mg/dL — AB (ref 125–200)
HDL: 48 mg/dL (ref >=46)
LDL CALC: 150 mg/dL — AB (ref <130)
TRIGLYCERIDES: 99 mg/dL (ref <150)
Total CHOL/HDL Ratio: 4.5 Ratio (ref <=5.0)
VLDL: 20 mg/dL (ref <30)

## 2014-12-10 LAB — HEMOGLOBIN A1C
HEMOGLOBIN A1C: 7.9 % — AB (ref <5.7)
MEAN PLASMA GLUCOSE: 180 mg/dL — AB (ref <117)

## 2014-12-10 LAB — GLUCOSE (CC13): GLUCOSE: 134 mg/dL (ref 70–140)

## 2014-12-10 NOTE — Patient Instructions (Signed)
Will continue not to consume too many Carbohydrates or sugars. Will try to get back to regular routine on treadmill. Will  Think about goals need to work on.

## 2014-12-10 NOTE — Progress Notes (Signed)
Patient is returning as a re screen to the [employer] and is currently a [facility] patient effective 11/26/2014. Patient signed consent and went over and received handout on [facility]   Clinical Measurements: Patient is 5 ft. 2 inches, weight 147.3 lbs, BMI 27 . This is a weight change of 3.5 pounds over 12 1/2 months.  Medical History: Patient has history of high cholesterol and diabetes. Patient states is not on medication for cholesterol because medicine cause muscle pains. Patient stated that is om Amaryl and metformin for diabetes.Patient does not have a history of hypertension. Per patient no diagnosed history of coronary heart disease, heart attack, heart failure, stroke/TIA, vascular disease or congenital heart defects.   Blood Pressure, Self-measurement: Patient states has no reason to check Blood pressure.  Nutrition Assessment: Patient stated that eats 2 fruits every day. Patient states she eats 4 servings of vegetables a day. Per patient states does not eat 3 or more ounces or more of whole grains daily. Patient stated does eat two or more servings of fish weekly. Patient states she does not drink more than 36 ounces or 450 calories of beverages with added sugars weekly. Patient stated she does not watch her salt intake.  Physical Activity Assessment: Patient stated that cleans house vigorously and walks for total of 450 minutes of moderate exercise. Patient rarely does any vigorous exercise.  Smoking Status: Patient has never smoked and is not exposed to smoke.   Quality of Life Assessment: In assessing patient's quality of life she stated that out of the past 30 days that she has felt her health is good all of them. Patient also stated that in the past 30 days that her mental health was good including stress, depression and problems with emotions for all days. Patient did state that out of the past 30 days she felt her physical or mental health had not kept her from doing  her usual activities including self-care, work or recreation.   Plan: Lab work will be done today including a lipid panel, blood glucose, and Hgb A1C. Will call lab results when they are finished. Will discuss risk reduction counseling when call results,

## 2014-12-11 ENCOUNTER — Telehealth: Payer: Self-pay

## 2014-12-11 NOTE — Telephone Encounter (Signed)
Called to inform about lab work from 12/10/14. I informed patient: cholesterol- 218, HDL- 48, LDL- 150, triglycerides - 99, Bld Glucose -134 and HBG-A1C - 7.9. Did risk reduction counseling concerning fiber, exercise, carbohydrates and sugars. Patient stated that was interested in [place] to set new goals. Set up health Coaching for January 5 at 2:00 PM at cancer center.

## 2014-12-21 ENCOUNTER — Telehealth (HOSPITAL_COMMUNITY): Payer: Self-pay | Admitting: *Deleted

## 2014-12-21 NOTE — Telephone Encounter (Signed)
Attempted to call patient to discuss Pap smear results. No one answered phone. Left voicemail for patient to call me back.

## 2014-12-22 ENCOUNTER — Telehealth (HOSPITAL_COMMUNITY): Payer: Self-pay | Admitting: *Deleted

## 2014-12-22 NOTE — Telephone Encounter (Signed)
Patient returned my phone call. Let patient know that her Pap smear was normal and HPV negative. Informed her that her next Pap smear will be due in 5 years since she has no history of an abnormal Pap smear. Patient verbalized understanding. Patient told me she rescheduled her diagnostic mammogram appointment due to a conflict she had with the date originally scheduled. Her appointment is Friday, January 01, 2015 at 1300 at [facility].

## 2015-01-14 ENCOUNTER — Ambulatory Visit: Payer: 59

## 2015-01-25 ENCOUNTER — Encounter (HOSPITAL_COMMUNITY): Payer: Self-pay | Admitting: *Deleted

## 2015-04-04 ENCOUNTER — Other Ambulatory Visit: Payer: Self-pay | Admitting: Endocrinology

## 2015-04-05 NOTE — Telephone Encounter (Signed)
Please refill x 1 Ov is due  

## 2015-04-05 NOTE — Telephone Encounter (Signed)
Please advise if ok to refill. Last appointment was 03/12/2014.  Thanks!

## 2015-04-06 NOTE — Telephone Encounter (Signed)
Appointment letter mailed to the pt.  

## 2015-04-15 ENCOUNTER — Telehealth: Payer: Self-pay | Admitting: Endocrinology

## 2015-04-15 ENCOUNTER — Encounter: Payer: Self-pay | Admitting: Endocrinology

## 2015-04-15 ENCOUNTER — Ambulatory Visit (INDEPENDENT_AMBULATORY_CARE_PROVIDER_SITE_OTHER): Payer: Self-pay | Admitting: Endocrinology

## 2015-04-15 VITALS — BP 136/84 | HR 100 | Temp 98.4°F | Ht 62.0 in | Wt 150.0 lb

## 2015-04-15 DIAGNOSIS — E1165 Type 2 diabetes mellitus with hyperglycemia: Secondary | ICD-10-CM | POA: Diagnosis not present

## 2015-04-15 DIAGNOSIS — IMO0001 Reserved for inherently not codable concepts without codable children: Secondary | ICD-10-CM

## 2015-04-15 LAB — MICROALBUMIN / CREATININE URINE RATIO
Creatinine,U: 182.2 mg/dL
MICROALB UR: 2.7 mg/dL — AB (ref 0.0–1.9)
Microalb Creat Ratio: 1.5 mg/g (ref 0.0–30.0)

## 2015-04-15 LAB — POCT GLYCOSYLATED HEMOGLOBIN (HGB A1C): Hemoglobin A1C: 8.7

## 2015-04-15 MED ORDER — SAXAGLIPTIN HCL 5 MG PO TABS: 5.0000 mg | ORAL_TABLET | Freq: Every day | ORAL | Status: DC

## 2015-04-15 MED ORDER — FLUCONAZOLE 150 MG PO TABS: 150.0000 mg | ORAL_TABLET | Freq: Once | ORAL | Status: DC

## 2015-04-15 NOTE — Progress Notes (Signed)
Subjective:    Patient ID: Lindsay Henderson, female    DOB: 1955/03/20, 60 y.o.   MRN: [id number]  HPI Pt returns for f/u of diabetes mellitus: DM type: 2 Dx'ed: [id number] Complications: retinopathy Therapy: 2 oral meds GDM: never DKA: never Severe hypoglycemia: never Pancreatitis: never Other: she has never been on insulin; she could not afford the invokana.  Interval history: pt states she feels well in general, except with hypoglycemia.  no cbg record, but states cbg's vary from 199-250. She has regained her insurance.    Past Medical History  Diagnosis Date  . Allergic rhinitis   . Asthma   . Anomalous atrioventricular excitation   . History of supraventricular tachycardia   . Hypercholesteremia   . Type II or unspecified type diabetes mellitus without mention of complication, not stated as uncontrolled   . GERD (gastroesophageal reflux disease)   . Gastritis   . Diverticulosis of colon   . Adenomatous colon polyp 11/2005  . Nephrolithiasis   . Renal cyst   . Vitamin D deficiency   . Somatic dysfunction   . Anxiety     no per pt  . Kidney stones   . Allergy     Past Surgical History  Procedure Laterality Date  . Tubal ligation    . Cardiac electrophysiology mapping and ablation  2004  . Colonoscopy  2007    Social History   Social History  . Marital Status: Married    Spouse Name: N/A  . Number of Children: 4  . Years of Education: N/A   Occupational History  . [employer]    Social History Main Topics  . Smoking status: Never Smoker   . Smokeless tobacco: Never Used  . Alcohol Use: No  . Drug Use: No  . Sexual Activity: Not Currently   Other Topics Concern  . Not on file   Social History Narrative    Current Outpatient Prescriptions on File Prior to Visit  Medication Sig Dispense Refill  . albuterol (PROAIR HFA) 108 (90 BASE) MCG/ACT inhaler Inhale 2 puffs into the lungs every 6 (six) hours as needed. 1 Inhaler 11  . beclomethasone  (QVAR) 80 MCG/ACT inhaler Inhale 1 puff into the lungs 2 (two) times daily. 1 Inhaler 6  . esomeprazole (NEXIUM) 20 MG capsule Take 20 mg by mouth daily at 12 noon.    . fluticasone (FLONASE) 50 MCG/ACT nasal spray Place 2 sprays into both nostrils daily.    Marland Kitchen glimepiride (AMARYL) 2 MG tablet TAKE ONE TABLET BY MOUTH ONCE DAILY BEFORE BREAKFAST 30 tablet 0  . levocetirizine ([name]) 5 MG tablet TAKE ONE TABLET BY MOUTH IN THE EVENING 30 tablet 0  . metFORMIN (GLUCOPHAGE) 500 MG tablet TAKE TWO TABLETS BY MOUTH TWICE DAILY WITH A MEAL 120 tablet 3  . Pseudoephedrine-DM-GG (SUDAFED COUGH PO) Take by mouth daily as needed.    . Vitamin D, Ergocalciferol, (DRISDOL) 50000 UNITS CAPS capsule TAKE 1 CAPSULE BY MOUTH EVERY 7 DAYS 4 capsule 0  . atorvastatin (LIPITOR) 20 MG tablet Take 1 tablet (20 mg total) by mouth daily. (Patient not taking: Reported on 04/15/2015) 90 tablet 1   No current facility-administered medications on file prior to visit.    Allergies  Allergen Reactions  . Parlodel [Bromocriptine Mesylate] Nausea Only    nausea  . Peanut-Containing Drug Products Hives, Itching and Swelling  . Penicillins     REACTION: rash and sob  . Pioglitazone Diarrhea    tremor  .  Sulfa Antibiotics Hives  . Zithromax [Azithromycin] Nausea And Vomiting    Family History  Problem Relation Age of Onset  . Diabetes Father   . Hypertension Father   . Hyperlipidemia Father   . Breast cancer Mother   . Esophageal cancer Neg Hx   . Stomach cancer Neg Hx   . Colon cancer Neg Hx   . Rectal cancer Neg Hx     BP 136/84 mmHg  Pulse 100  Temp(Src) 98.4 F (36.9 C) (Oral)  Ht 5\' 2"  (1.575 m)  Wt 150 lb (68.04 kg)  BMI 27.43 kg/m2  SpO2 97%  Review of Systems She denies hypoglycemia.  Her vaginal itching her recurred.    Objective:   Physical Exam VITAL SIGNS:  See vs page GENERAL: no distress Pulses: dorsalis pedis intact bilat.   MSK: no deformity of the feet CV: no leg edema Skin:  no  ulcer on the feet.  normal color and temp on the feet. Neuro: sensation is intact to touch on the feet   A1c=8.7%    Assessment & Plan:  DM: worse.   Yeast vaginitis, recurrent.    Patient is advised the following: Patient Instructions  Please start taking onglyza (i have sent a prescription to your pharmacy), and: Please continue the same other medications.   Please come back for a follow-up appointment in 6 weeks.  i have also sent a prescription to your pharmacy, for the yeast infection (skip the atorvastatin that day, due to an interaction). check your blood sugar once a day.  vary the time of day when you check, between before the 3 meals, and at bedtime.  also check if you have symptoms of your blood sugar being too high or too low.  please keep a record of the readings and bring it to your next appointment here.  You can write it on any piece of paper.  please call us sooner if your blood sugar goes below 70, or if you have a lot of readings over 200.

## 2015-04-15 NOTE — Telephone Encounter (Signed)
Rx submitted per pt's request.  

## 2015-04-15 NOTE — Patient Instructions (Addendum)
Please start taking onglyza (i have sent a prescription to your pharmacy), and: Please continue the same other medications.   Please come back for a follow-up appointment in 6 weeks.  i have also sent a prescription to your pharmacy, for the yeast infection (skip the atorvastatin that day, due to an interaction). check your blood sugar once a day.  vary the time of day when you check, between before the 3 meals, and at bedtime.  also check if you have symptoms of your blood sugar being too high or too low.  please keep a record of the readings and bring it to your next appointment here.  You can write it on any piece of paper.  please call us sooner if your blood sugar goes below 70, or if you have a lot of readings over 200.

## 2015-04-15 NOTE — Telephone Encounter (Signed)
Pt states [facility] did not receive the rx for diflucan please call it in again

## 2015-04-20 ENCOUNTER — Telehealth: Payer: Self-pay

## 2015-04-20 NOTE — Telephone Encounter (Signed)
Called to follow up on nutrition and exercise.Left message to return call.

## 2015-04-21 ENCOUNTER — Telehealth: Payer: Self-pay

## 2015-04-21 NOTE — Telephone Encounter (Signed)
Scheduled for Nutritional Health Coaching  For Aril 20 at 8 AM.

## 2015-04-22 ENCOUNTER — Encounter: Payer: BLUE CROSS/BLUE SHIELD | Admitting: Dietician

## 2015-04-29 ENCOUNTER — Encounter: Payer: BLUE CROSS/BLUE SHIELD | Attending: Endocrinology | Admitting: Dietician

## 2015-04-29 ENCOUNTER — Encounter: Payer: Self-pay | Admitting: Dietician

## 2015-04-29 ENCOUNTER — Other Ambulatory Visit: Payer: Self-pay

## 2015-04-29 ENCOUNTER — Ambulatory Visit: Payer: BLUE CROSS/BLUE SHIELD

## 2015-04-29 VITALS — Ht 62.0 in | Wt 152.0 lb

## 2015-04-29 DIAGNOSIS — IMO0002 Reserved for concepts with insufficient information to code with codable children: Secondary | ICD-10-CM

## 2015-04-29 DIAGNOSIS — E1165 Type 2 diabetes mellitus with hyperglycemia: Secondary | ICD-10-CM | POA: Insufficient documentation

## 2015-04-29 DIAGNOSIS — E118 Type 2 diabetes mellitus with unspecified complications: Secondary | ICD-10-CM

## 2015-04-29 DIAGNOSIS — Z789 Other specified health status: Secondary | ICD-10-CM

## 2015-04-29 MED ORDER — METFORMIN HCL 500 MG PO TABS: ORAL_TABLET | ORAL | Status: DC

## 2015-04-29 NOTE — Patient Instructions (Addendum)
Will go to nutritionist/dietitian with open mind. Will make necessary changes in diet. Will increase activity by walking 5 days a week on treadmill or outside.

## 2015-04-29 NOTE — Progress Notes (Signed)
Medical Nutrition Therapy:  Appt start time: 1430 end time:  [id number].   Assessment:  Primary concerns today: Patient is here with her husband.  She would like to better learn how to control her diabetes and count carbohydrates.   Her diet is very high in added sugar.  Hx includes type 2 diabetes since 2002, h/o gestational diabetes, GERD hyperlipidemia, CKD and vitamin D deficiency.  She has seen Rise Paganini RD in the past and attended diabetes education classes.  Her HgbA1C was 8.7% 04/15/15 increased from 7.7% 10/2014.  Her philosophy was to eat whatever she wanted and let the medication control her blood sugar but she does not want to start insulin and is now more willing to make changes.  Her blood sugars have decreased in the past month.  AM cbg has decreased from 150 to 105-135 and her HS blood sugar has decreased from 200 to 140-150. She is a patient of Dr. Loanne Drilling.  Patient lives with her husband and daughter.  Patient shops and cooks with husbands assistance at times.  She works at a call center part time.  Preferred Learning Style:   No preference indicated   Learning Readiness:   Ready  Change in progress   MEDICATIONS: see list to include amaryl, metformin, and onglyza   DIETARY INTAKE:  Usual eating pattern includes 2-3 meals and 1-2 snacks per day.  Everyday foods include high amounts of added sugar.  Avoided foods include artificial sweeteners.    24-hr recall:  B (8 AM): oatmeal (homemade) with lemon, vanilla, butter, milk, and 1/2 cup sugar OR bacon egg, and cheese biscuit 3 times per week.  Snk ( AM): regular yoplait yogurt or applesauce  L (3-4 PM):   [employer] or [employer] or [employer] OR home:  [employer] or frosted flakes if sugar is low Snk ( PM): Nabs D ( PM): if lunch is late then she will only eat popcorn or a frosty Snk ( PM): or Little Debbie cake or honey bun Beverages: water, regular coke  Usual physical activity: none.  Used to use the treadmill at  her house but has not used it for 2 months due to increased pain due to plantar facientis.  Estimated energy needs: 1400 calories 158 g carbohydrates 88 g protein 47 g fat  Progress Towards Goal(s):  In progress.   Nutritional Diagnosis:  NB-1.1 Food and nutrition-related knowledge deficit As related to balance of carbohydrates, protein, and fats.  As evidenced by diet hx.    Intervention:  Nutrition counseling and diabetes education initiated. Discussed Carb Counting by food group as method of portion control, reading food labels, and benefits of increased activity. Also discussed basic physiology of Diabetes, target BG ranges pre and post meals, and A1c. Discussed meal planning at home and out to eat.    Consider Calorie [employer] Pace app to help make better food choices when eating out and know your carbohydrate intake. Rethink what you drink.  Choose water, unsweetened tea. Reduce your intake of added sugar and sweets.  Aim for 3 Carb Choices per meal (45 grams) +/- 1 either way  Aim for 0-1 Carbs per snack if hungry  Include protein in moderation with your meals and snacks Consider reading food labels for Total Carbohydrate and Fat Grams of foods Consider  increasing your activity level by walking for 30 minutes daily as tolerated Consider checking BG at alternate times per day as directed by MD  Consider taking medication as directed by MD  Teaching Method Utilized:  Visual Auditory Hands on  Handouts given during visit include: Living Well with Diabetes Carb Counting and Food Label handouts Meal Plan Card A1C sheet  Snack list  Barriers to learning/adherence to lifestyle change: significant changes to make  Demonstrated degree of understanding via:  Teach Back   Monitoring/Evaluation:  Dietary intake, exercise, and body weight in 1 month(s).

## 2015-04-29 NOTE — Patient Instructions (Addendum)
Consider Calorie [employer] Pace app to help make better food choices when eating out and know your carbohydrate intake. Rethink what you drink.  Choose water, unsweetened tea. Reduce your intake of added sugar and sweets.  Aim for 3 Carb Choices per meal (45 grams) +/- 1 either way  Aim for 0-1 Carbs per snack if hungry  Include protein in moderation with your meals and snacks Consider reading food labels for Total Carbohydrate and Fat Grams of foods Consider  increasing your activity level by walking for 30 minutes daily as tolerated Consider checking BG at alternate times per day as directed by MD  Consider taking medication as directed by MD

## 2015-04-29 NOTE — Progress Notes (Signed)
[place] Patient returns today for Health Coaching. Patient coming mainly because BMI 27, should be less than 25, and A1C went from 7.9 to 8.7 in 4 months.  HEALTH COACHING:  Patient and I went over lab results. Patient stated that she thought that it was because of her not having the money to get her new diabetic medicine for awhile. Discussed and reviewed out all the portions that should eat a day and serving sizes. Explained that sugars and carbohydrates affected A1C the most. To decrease patient's LDL's we discussed that needs to exercise more and increase fiber in her diet. Discussed foods like broccoli, brown rice instead of white, etc. Patient stated that could not eat wheat bread so I told to ask Nutritionist which was best to eat. Patient pulled out jelly bean to eat because stated that CBG was 115 this morning and that was too low for her. It made her weak. Discussed that needed a peanut butter cracker instead because jelly bean make glycemic index rise too fast. Peanut butter a better choice.  Discussed exercise like walking. Patient stated that has a treadmill. Discussed starting out walking 5 minutes a day and increasing minutes until doing at least 150 minutes a week.  Patient stated that during winter ate a lot of chili beans and other beans. The patient stated had forgotten that they all were starchy and need to add them into the count of starches. Though patient believes that is partially why A1C was up but also still the medicine because could not get because cost too much.  Per patient still adding white sugar to cereals and eating little Debbie's or honey buns at night. Asked patient to ask nutritionist/dietitian about stevia or honey and a good snack/bread for patient to eat  PLAN: Will Call in a couple weeks. Will decrease starches.

## 2015-05-06 ENCOUNTER — Other Ambulatory Visit: Payer: Self-pay | Admitting: Endocrinology

## 2015-05-21 ENCOUNTER — Telehealth: Payer: Self-pay

## 2015-05-21 NOTE — Telephone Encounter (Signed)
[place] Patient called per phone today for Health Coaching. Patient's areas of concern were A1C, LDL, and exercise.  HEALTH COACHING: Patient stated had decreased the amount carbohydrates eating. Stated that went to see nutritionist and she told her many of same things I had told patient. Per patient can have 45 carbohydrates per meal and some for snacks. Discussed getting mind on doing what is best for ourselves. Patient states is felling much better.  Patient stated that did good the first couple of weeks doing the treadmill but had stopped. She stated that was going to start back and remembered that baby steps become big steps.  PLAN: Will Call in 3 to 4 weeks. Will continue with increasing exercise by walking on treadmill. Will continue with the good meal and nutrition changes. Will be doing follow up assessment when call next.

## 2015-05-27 ENCOUNTER — Encounter: Payer: Self-pay | Admitting: Endocrinology

## 2015-05-27 ENCOUNTER — Encounter: Payer: Self-pay | Admitting: Dietician

## 2015-05-27 ENCOUNTER — Encounter: Payer: BLUE CROSS/BLUE SHIELD | Attending: Endocrinology | Admitting: Dietician

## 2015-05-27 ENCOUNTER — Ambulatory Visit (INDEPENDENT_AMBULATORY_CARE_PROVIDER_SITE_OTHER): Payer: BLUE CROSS/BLUE SHIELD | Admitting: Endocrinology

## 2015-05-27 VITALS — BP 134/94 | HR 94 | Wt 152.2 lb

## 2015-05-27 VITALS — Wt 152.0 lb

## 2015-05-27 DIAGNOSIS — IMO0002 Reserved for concepts with insufficient information to code with codable children: Secondary | ICD-10-CM

## 2015-05-27 DIAGNOSIS — E1165 Type 2 diabetes mellitus with hyperglycemia: Secondary | ICD-10-CM | POA: Diagnosis present

## 2015-05-27 DIAGNOSIS — E119 Type 2 diabetes mellitus without complications: Secondary | ICD-10-CM

## 2015-05-27 DIAGNOSIS — E118 Type 2 diabetes mellitus with unspecified complications: Secondary | ICD-10-CM

## 2015-05-27 MED ORDER — REPAGLINIDE 2 MG PO TABS: 2.0000 mg | ORAL_TABLET | Freq: Three times a day (TID) | ORAL | Status: DC

## 2015-05-27 NOTE — Progress Notes (Signed)
Medical Nutrition Therapy:  Appt start time: 0915 end time:  0930.   Assessment: 04/29/15 Primary concerns today: Patient is here with her husband.  She would like to better learn how to control her diabetes and count carbohydrates.   Her diet is very high in added sugar.  Hx includes type 2 diabetes since 2002, h/o gestational diabetes, GERD hyperlipidemia, CKD and vitamin D deficiency.  She has seen Rise Paganini RD in the past and attended diabetes education classes.  Her HgbA1C was 8.7% 04/15/15 increased from 7.7% 10/2014.  Her philosophy was to eat whatever she wanted and let the medication control her blood sugar but she does not want to start insulin and is now more willing to make changes.  Her blood sugars have decreased in the past month.  AM cbg has decreased from 150 to 105-135 and her HS blood sugar has decreased from 200 to 140-150. She is a patient of Dr. Loanne Drilling.  Patient lives with her husband and daughter.  Patient shops and cooks with husbands assistance at times.  She works at a call center part time.  05/27/15 follow up: Patient is here today alone.  Her weight is 152 lbs and is stable from her last visit.  She has made multiple changes. She now uses the treadmill 4 times per week and has just increased this to 30 minutes.  Her blood sugar gets low at times.  Her medication is changing.  Glimepiride is changing to repaglinide to help with this.  She will continue the metformin and onglyza.  She has also been taking 1000 units of vitamin D daily and will ask to have her vitamin D level checked on her next physical.  She has decreased her sugar intake and salt intake significantly and is no longer craving sugar.  She is participating in [place] [employer] with Carla Drape and this has also given her motivation for change.  Preferred Learning Style:   No preference indicated   Learning Readiness:   Ready  Change in progress   MEDICATIONS: see list to include amaryl, metformin, and onglyza    DIETARY INTAKE:  Usual eating pattern includes 2-3 meals and 1-2 snacks per day.  Everyday foods include high amounts of added sugar.  Avoided foods include artificial sweeteners.    24-hr recall:  B (8 AM): oatmeal (homemade) with lemon, vanilla, 1/2 tspbutter, milk, and 1/4 tsp OR bacon, egg, and cheese biscuit 3 times per week.  Snk ( AM): yogurt or fruit L (3-4 PM):  [employer] or other restaurant or unsweetened cereal Snk ( PM): skinny popcorn and  D ( PM): if lunch is late then she will only eat popcorn  Snk ( PM): popcorn (skinny pop) Beverages: water, regular coke  Usual physical activity: none. Treadmill 4 times per week for 30 minutes.  Estimated energy needs: 1400 calories 158 g carbohydrates 88 g protein 47 g fat  Progress Towards Goal(s):  In progress.   Nutritional Diagnosis:  NB-1.1 Food and nutrition-related knowledge deficit As related to balance of carbohydrates, protein, and fats.  As evidenced by diet hx.    Intervention:  Nutrition counseling and diabetes education initiated. Discussed Carb Counting by food group as method of portion control, reading food labels, and benefits of increased activity. Also discussed basic physiology of Diabetes, target BG ranges pre and post meals, and A1c. Discussed meal planning at home and out to eat.    Consider Calorie [employer] app to help make better food choices when eating out  and know your carbohydrate intake. Rethink what you drink.  Choose water, unsweetened tea. Reduce your intake of added sugar and sweets.  Aim for 3 Carb Choices per meal (45 grams) +/- 1 either way  Aim for 0-1 Carbs per snack if hungry  Include protein in moderation with your meals and snacks Consider reading food labels for Total Carbohydrate and Fat Grams of foods Consider  increasing your activity level by walking for 30 minutes daily as tolerated Consider checking BG at alternate times per day as directed by MD  Consider taking medication as  directed by MD Keep up all of the good work!  Teaching Method Utilized:  Visual Auditory Hands on  Handouts given during last visit include: Living Well with Diabetes Carb Counting and Food Label handouts Meal Plan Card A1C sheet  Snack list  Barriers to learning/adherence to lifestyle change: significant changes to make  Demonstrated degree of understanding via:  Teach Back   Monitoring/Evaluation:  Dietary intake, exercise, and body weight prn.

## 2015-05-27 NOTE — Progress Notes (Signed)
Subjective:    Patient ID: Mercy Riding, female    DOB: [birth year], 60 y.o.   MRN: [id number]  HPI Pt returns for f/u of diabetes mellitus: DM type: 2 Dx'ed: [id number] Complications: retinopathy Therapy: 3 oral meds GDM: (1987 and 1992) DKA: never Severe hypoglycemia: never Pancreatitis: never Other: she took insulin only for GDM; she could not afford invokana.  Interval history: pt states she feels well in general, except for frequent mild hypoglycemia.  no cbg record, but states cbg's vary from 70-200. She has regained her insurance.  Past Medical History  Diagnosis Date  . Allergic rhinitis   . Asthma   . Anomalous atrioventricular excitation   . History of supraventricular tachycardia   . Hypercholesteremia   . Type II or unspecified type diabetes mellitus without mention of complication, not stated as uncontrolled   . GERD (gastroesophageal reflux disease)   . Gastritis   . Diverticulosis of colon   . Adenomatous colon polyp 11/2005  . Nephrolithiasis   . Renal cyst   . Vitamin D deficiency   . Somatic dysfunction   . Anxiety     no per pt  . Kidney stones   . Allergy     Past Surgical History  Procedure Laterality Date  . Tubal ligation    . Cardiac electrophysiology mapping and ablation  2004  . Colonoscopy  2007    Social History   Social History  . Marital Status: Married    Spouse Name: N/A  . Number of Children: 4  . Years of Education: N/A   Occupational History  . [employer]    Social History Main Topics  . Smoking status: Never Smoker   . Smokeless tobacco: Never Used  . Alcohol Use: No  . Drug Use: No  . Sexual Activity: Not Currently   Other Topics Concern  . Not on file   Social History Narrative    Current Outpatient Prescriptions on File Prior to Visit  Medication Sig Dispense Refill  . albuterol (PROAIR HFA) 108 (90 BASE) MCG/ACT inhaler Inhale 2 puffs into the lungs every 6 (six) hours as needed. 1 Inhaler 11  .  atorvastatin (LIPITOR) 20 MG tablet Take 1 tablet (20 mg total) by mouth daily. 90 tablet 1  . beclomethasone (QVAR) 80 MCG/ACT inhaler Inhale 1 puff into the lungs 2 (two) times daily. 1 Inhaler 6  . esomeprazole (NEXIUM) 20 MG capsule Take 20 mg by mouth daily at 12 noon.    . fexofenadine ([name]) 180 MG tablet Take 180 mg by mouth daily.    . fluconazole (DIFLUCAN) 150 MG tablet Take 1 tablet (150 mg total) by mouth once. 1 tablet 3  . fluticasone (FLONASE) 50 MCG/ACT nasal spray Place 2 sprays into both nostrils daily.    Marland Kitchen levocetirizine ([name]) 5 MG tablet TAKE ONE TABLET BY MOUTH IN THE EVENING 30 tablet 0  . metFORMIN (GLUCOPHAGE) 500 MG tablet TAKE TWO TABLETS BY MOUTH TWICE DAILY WITH A MEAL 120 tablet 3  . Pseudoephedrine-DM-GG (SUDAFED COUGH PO) Take by mouth daily as needed.    . saxagliptin HCl (ONGLYZA) 5 MG TABS tablet Take 1 tablet (5 mg total) by mouth daily. 30 tablet 11  . Vitamin D, Ergocalciferol, (DRISDOL) 50000 UNITS CAPS capsule TAKE 1 CAPSULE BY MOUTH EVERY 7 DAYS (Patient not taking: Reported on 05/27/2015) 4 capsule 0   No current facility-administered medications on file prior to visit.    Allergies  Allergen Reactions  . Parlodel [  Bromocriptine Mesylate] Nausea Only    nausea  . Peanut-Containing Drug Products Hives, Itching and Swelling  . Penicillins     REACTION: rash and sob  . Pioglitazone Diarrhea    tremor  . Sulfa Antibiotics Hives  . Zithromax [Azithromycin] Nausea And Vomiting    Family History  Problem Relation Age of Onset  . Diabetes Father   . Hypertension Father   . Hyperlipidemia Father   . Breast cancer Mother   . Esophageal cancer Neg Hx   . Stomach cancer Neg Hx   . Colon cancer Neg Hx   . Rectal cancer Neg Hx     BP 134/94 mmHg  Pulse 94  Wt 152 lb 3.2 oz (69.037 kg)  SpO2 94%   Review of Systems Denies LOC    Objective:   Physical Exam VITAL SIGNS:  See vs page GENERAL: no distress Ext: no edema   Lab Results    Component Value Date   HGBA1C 8.7 04/15/2015   Lab Results  Component Value Date   CREATININE 0.75 10/28/2014   BUN 12 10/28/2014   NA 140 10/28/2014   K 3.6 10/28/2014   CL 103 10/28/2014   CO2 28 10/28/2014      Assessment & Plan:  DM: she needs increased rx, if it can be done with a regimen that avoids or minimizes hypoglycemia.    Patient is advised the following: Patient Instructions  i have sent a prescription to your pharmacy, to change glimepiride to "repaglinide." Please come back for a follow-up appointment in 2 months.  check your blood sugar once a day.  vary the time of day when you check, between before the 3 meals, and at bedtime.  also check if you have symptoms of your blood sugar being too high or too low.  please keep a record of the readings and bring it to your next appointment here.  You can write it on any piece of paper.  please call us sooner if your blood sugar goes below 70, or if you have a lot of readings over 200.

## 2015-05-27 NOTE — Patient Instructions (Addendum)
i have sent a prescription to your pharmacy, to change glimepiride to "repaglinide." Please come back for a follow-up appointment in 2 months.  check your blood sugar once a day.  vary the time of day when you check, between before the 3 meals, and at bedtime.  also check if you have symptoms of your blood sugar being too high or too low.  please keep a record of the readings and bring it to your next appointment here.  You can write it on any piece of paper.  please call us sooner if your blood sugar goes below 70, or if you have a lot of readings over 200.

## 2015-05-27 NOTE — Patient Instructions (Signed)
Keep up all of the good work!

## 2015-05-29 DIAGNOSIS — E113593 Type 2 diabetes mellitus with proliferative diabetic retinopathy without macular edema, bilateral: Secondary | ICD-10-CM | POA: Insufficient documentation

## 2015-05-29 DIAGNOSIS — E119 Type 2 diabetes mellitus without complications: Secondary | ICD-10-CM | POA: Insufficient documentation

## 2015-06-10 ENCOUNTER — Other Ambulatory Visit: Payer: Self-pay | Admitting: Endocrinology

## 2015-06-10 NOTE — Telephone Encounter (Signed)
Please advise if ok to refill. Medication is not on current med list. Thanks!  

## 2015-07-26 ENCOUNTER — Telehealth: Payer: Self-pay

## 2015-07-26 NOTE — Telephone Encounter (Signed)
Called to do final Follow up assessment.left message for patient to return the call.

## 2015-07-27 ENCOUNTER — Telehealth: Payer: Self-pay

## 2015-07-27 NOTE — Telephone Encounter (Signed)
Patient called for final  Follow Up assessment [facility].  ASSESSMENT :This is a follow up Assessment following [place] . Marland Kitchen  Medication Status : Patient states is not taking medication for high cholesterol or hypertension. Patient is still on medication for diabetes.   Blood Pressure, Self-measurement: Patient states has no reason to check Blood pressure.  Nutrition Assessment: Patient stated that eats 3 servings fruit every day. Patient states she eats 3 servings of vegetables a day. Per Patient does not eat 3 or more ounces of whole grains daily due to her wheat allergy. Patient stated does eat two or more servings of fish weekly. Patient states she does not drink more than 36 ounces or 450 calories of beverages with added sugars weekly. Patient stated she does not watch her salt intake.   Physical Activity Assessment: Patient stated she does around 120 minutes of moderate activity and no vigorous per week.  Smoking Status: Patient stated has never smoked and is not exposed to smoke.  Quality of Life Assessment: In assessing patient's Physical quality of life she stated that out of the past 30 days that she has felt her health was good all of them. Patient also stated that in the past 30 days that her mental health was good including stress, depression and problems with emotions for all days. Patient did state that out of the past 30 days she felt her physical or mental health had not kept her from doing her usual activities including self-care, work or recreation.   PLAN: Informed patient that could call if needed to but patient is aware that she does not qualify for program any more unless loses her insurance.  TIME SPENT with PATIENT: 15 minutes

## 2015-08-03 ENCOUNTER — Ambulatory Visit: Payer: BLUE CROSS/BLUE SHIELD | Admitting: Endocrinology

## 2015-08-24 ENCOUNTER — Encounter: Payer: Self-pay | Admitting: Endocrinology

## 2015-08-24 LAB — HM DIABETES EYE EXAM

## 2015-08-31 ENCOUNTER — Telehealth: Payer: Self-pay | Admitting: Emergency Medicine

## 2015-08-31 DIAGNOSIS — Z0000 Encounter for general adult medical examination without abnormal findings: Secondary | ICD-10-CM

## 2015-08-31 NOTE — Telephone Encounter (Signed)
Pt wants to know if she can come in tomorrow to get labs done before her CPE on fri. If so can you put those labs in. Thanks.

## 2015-09-01 ENCOUNTER — Encounter: Payer: BLUE CROSS/BLUE SHIELD | Admitting: Internal Medicine

## 2015-09-01 NOTE — Telephone Encounter (Signed)
Placed orders for routine labs however depending on the visit and any concerns this does not guarantee that there will not be labs done that day. Would recommend to come fasting for the labs.

## 2015-09-02 NOTE — Telephone Encounter (Signed)
Left message informing patient that labs have been placed.

## 2015-09-03 ENCOUNTER — Encounter: Payer: Self-pay | Admitting: Internal Medicine

## 2015-09-03 ENCOUNTER — Ambulatory Visit (INDEPENDENT_AMBULATORY_CARE_PROVIDER_SITE_OTHER): Payer: BLUE CROSS/BLUE SHIELD | Admitting: Internal Medicine

## 2015-09-03 ENCOUNTER — Encounter: Payer: BLUE CROSS/BLUE SHIELD | Admitting: Internal Medicine

## 2015-09-03 ENCOUNTER — Other Ambulatory Visit (INDEPENDENT_AMBULATORY_CARE_PROVIDER_SITE_OTHER): Payer: BLUE CROSS/BLUE SHIELD

## 2015-09-03 VITALS — BP 138/78 | HR 93 | Temp 98.2°F | Resp 14 | Ht 62.0 in | Wt 150.0 lb

## 2015-09-03 DIAGNOSIS — Z0000 Encounter for general adult medical examination without abnormal findings: Secondary | ICD-10-CM | POA: Diagnosis not present

## 2015-09-03 DIAGNOSIS — J309 Allergic rhinitis, unspecified: Secondary | ICD-10-CM | POA: Diagnosis not present

## 2015-09-03 DIAGNOSIS — E7800 Pure hypercholesterolemia, unspecified: Secondary | ICD-10-CM

## 2015-09-03 DIAGNOSIS — E119 Type 2 diabetes mellitus without complications: Secondary | ICD-10-CM

## 2015-09-03 DIAGNOSIS — E559 Vitamin D deficiency, unspecified: Secondary | ICD-10-CM | POA: Diagnosis not present

## 2015-09-03 DIAGNOSIS — J4520 Mild intermittent asthma, uncomplicated: Secondary | ICD-10-CM

## 2015-09-03 LAB — COMPREHENSIVE METABOLIC PANEL
ALT: 16 U/L (ref 0–35)
AST: 15 U/L (ref 0–37)
Albumin: 4.2 g/dL (ref 3.5–5.2)
Alkaline Phosphatase: 70 U/L (ref 39–117)
BILIRUBIN TOTAL: 0.4 mg/dL (ref 0.2–1.2)
BUN: 9 mg/dL (ref 6–23)
CALCIUM: 9 mg/dL (ref 8.4–10.5)
CO2: 29 meq/L (ref 19–32)
CREATININE: 0.69 mg/dL (ref 0.40–1.20)
Chloride: 102 mEq/L (ref 96–112)
GFR: 111.23 mL/min (ref >60.00)
GLUCOSE: 127 mg/dL — AB (ref 70–99)
Potassium: 4 mEq/L (ref 3.5–5.1)
Sodium: 139 mEq/L (ref 135–145)
TOTAL PROTEIN: 7.3 g/dL (ref 6.0–8.3)

## 2015-09-03 LAB — CBC
HCT: 40 % (ref 36.0–46.0)
Hemoglobin: 13.7 g/dL (ref 12.0–15.0)
MCHC: 34.2 g/dL (ref 30.0–36.0)
MCV: 85.8 fl (ref 78.0–100.0)
Platelets: 272 10*3/uL (ref 150.0–400.0)
RBC: 4.66 Mil/uL (ref 3.87–5.11)
RDW: 13.6 % (ref 11.5–15.5)
WBC: 8.8 10*3/uL (ref 4.0–10.5)

## 2015-09-03 LAB — VITAMIN D 25 HYDROXY (VIT D DEFICIENCY, FRACTURES): VITD: 17.76 ng/mL — AB (ref 30.00–100.00)

## 2015-09-03 LAB — LIPID PANEL
CHOL/HDL RATIO: 5
Cholesterol: 219 mg/dL — ABNORMAL HIGH (ref 0–200)
HDL: 47.9 mg/dL (ref >39.00)
LDL Cholesterol: 145 mg/dL — ABNORMAL HIGH (ref 0–99)
NonHDL: 171.49
TRIGLYCERIDES: 130 mg/dL (ref 0.0–149.0)
VLDL: 26 mg/dL (ref 0.0–40.0)

## 2015-09-03 LAB — HEMOGLOBIN A1C: Hgb A1c MFr Bld: 7.7 % — ABNORMAL HIGH (ref 4.6–6.5)

## 2015-09-03 MED ORDER — ALBUTEROL SULFATE HFA 108 (90 BASE) MCG/ACT IN AERS: 2.0000 | INHALATION_SPRAY | Freq: Four times a day (QID) | RESPIRATORY_TRACT | 2 refills | Status: DC | PRN

## 2015-09-03 MED ORDER — BECLOMETHASONE DIPROPIONATE 80 MCG/ACT IN AERS: 1.0000 | INHALATION_SPRAY | Freq: Two times a day (BID) | RESPIRATORY_TRACT | 6 refills | Status: DC

## 2015-09-03 NOTE — Progress Notes (Signed)
Pre visit review using our clinic review tool, if applicable. No additional management support is needed unless otherwise documented below in the visit note. 

## 2015-09-03 NOTE — Assessment & Plan Note (Signed)
Mild intermittent without flare today. Takes qvar when allergies are bad and rare albuterol usage.

## 2015-09-03 NOTE — Assessment & Plan Note (Signed)
Not at goal of <100 LDL. Has failed crestor and lipitor for side effects. Will try zetia if LDL still above goal today.

## 2015-09-03 NOTE — Progress Notes (Signed)
   Subjective:    Patient ID: Lindsay Henderson, female    DOB: 1955-05-19, 60 y.o.   MRN: [id number]  HPI The patient is a 60 YO female coming in for physical. No new concerns. Please see A/P for status and treatment of chronic medical problems.   PMH, [facility], social history reviewed and updated.   Review of Systems  Constitutional: Negative for activity change, appetite change, fatigue, fever and unexpected weight change.  HENT: Positive for congestion and postnasal drip. Negative for ear discharge, ear pain, facial swelling, sinus pressure and sore throat.   Eyes: Positive for itching.  Respiratory: Negative for cough, chest tightness, shortness of breath and wheezing.   Cardiovascular: Negative for chest pain, palpitations and leg swelling.  Gastrointestinal: Negative.   Musculoskeletal: Negative.   Skin: Negative.   Neurological: Negative.   Psychiatric/Behavioral: Negative.       Objective:   Physical Exam  Constitutional: She is oriented to person, place, and time. She appears well-developed and well-nourished.  HENT:  Head: Normocephalic and atraumatic.  Oropharynx red, turbinates red and inflamed.   Eyes: EOM are normal.  Neck: Normal range of motion.  Cardiovascular: Normal rate and regular rhythm.   Carotids without bruit bilaterally.  Pulmonary/Chest: Effort normal and breath sounds normal. No respiratory distress. She has no wheezes. She has no rales.  Abdominal: Soft. Bowel sounds are normal. She exhibits no distension. There is no tenderness. There is no rebound.  Musculoskeletal: She exhibits no edema.  Neurological: She is alert and oriented to person, place, and time. Coordination normal.  Skin: Skin is warm and dry.  Psychiatric: She has a normal mood and affect.   Vitals:   09/03/15 0903  BP: 138/78  Pulse: 93  Resp: 14  Temp: 98.2 F (36.8 C)  TempSrc: Oral  SpO2: 98%  Weight: 150 lb (68 kg)  Height: 5\' 2"  (1.575 m)      Assessment & Plan:

## 2015-09-03 NOTE — Assessment & Plan Note (Signed)
Having some symptoms as we are approaching fall. No indication for antibiotics today.

## 2015-09-03 NOTE — Patient Instructions (Signed)
We are checking the labs today and will call you back with the results.   Keep working on increasing the exercise if able to help with the sugars.   Think about getting the pneumonia shot as you would get benefit from it.   Health Maintenance, Female Adopting a healthy lifestyle and getting preventive care can go a long way to promote health and wellness. Talk with your health care provider about what schedule of regular examinations is right for you. This is a good chance for you to check in with your provider about disease prevention and staying healthy. In between checkups, there are plenty of things you can do on your own. Experts have done a lot of research about which lifestyle changes and preventive measures are most likely to keep you healthy. Ask your health care provider for more information. WEIGHT AND DIET  Eat a healthy diet  Be sure to include plenty of vegetables, fruits, low-fat dairy products, and lean protein.  Do not eat a lot of foods high in solid fats, added sugars, or salt.  Get regular exercise. This is one of the most important things you can do for your health.  Most adults should exercise for at least 150 minutes each week. The exercise should increase your heart rate and make you sweat (moderate-intensity exercise).  Most adults should also do strengthening exercises at least twice a week. This is in addition to the moderate-intensity exercise.  Maintain a healthy weight  Body mass index (BMI) is a measurement that can be used to identify possible weight problems. It estimates body fat based on height and weight. Your health care provider can help determine your BMI and help you achieve or maintain a healthy weight.  For females 61 years of age and older:   A BMI below 18.5 is considered underweight.  A BMI of 18.5 to 24.9 is normal.  A BMI of 25 to 29.9 is considered overweight.  A BMI of 30 and above is considered obese.  Watch levels of cholesterol  and blood lipids  You should start having your blood tested for lipids and cholesterol at 61 years of age, then have this test every 5 years.  You may need to have your cholesterol levels checked more often if:  Your lipid or cholesterol levels are high.  You are older than 61 years of age.  You are at high risk for heart disease.  CANCER SCREENING   Lung Cancer  Lung cancer screening is recommended for adults 68-33 years old who are at high risk for lung cancer because of a history of smoking.  A yearly low-dose CT scan of the lungs is recommended for people who:  Currently smoke.  Have quit within the past 15 years.  Have at least a 30-pack-year history of smoking. A pack year is smoking an average of one pack of cigarettes a day for 1 year.  Yearly screening should continue until it has been 15 years since you quit.  Yearly screening should stop if you develop a health problem that would prevent you from having lung cancer treatment.  Breast Cancer  Practice breast self-awareness. This means understanding how your breasts normally appear and feel.  It also means doing regular breast self-exams. Let your health care provider know about any changes, no matter how small.  If you are in your 20s or 30s, you should have a clinical breast exam (CBE) by a health care provider every 1-3 years as part of a  regular health exam.  If you are 40 or older, have a CBE every year. Also consider having a breast X-ray (mammogram) every year.  If you have a family history of breast cancer, talk to your health care provider about genetic screening.  If you are at high risk for breast cancer, talk to your health care provider about having an MRI and a mammogram every year.  Breast cancer gene (BRCA) assessment is recommended for women who have family members with BRCA-related cancers. BRCA-related cancers include:  Breast.  Ovarian.  Tubal.  Peritoneal cancers.  Results of the  assessment will determine the need for genetic counseling and BRCA1 and BRCA2 testing. Cervical Cancer Your health care provider may recommend that you be screened regularly for cancer of the pelvic organs (ovaries, uterus, and vagina). This screening involves a pelvic examination, including checking for microscopic changes to the surface of your cervix (Pap test). You may be encouraged to have this screening done every 3 years, beginning at age 58.  For women ages 70-65, health care providers may recommend pelvic exams and Pap testing every 3 years, or they may recommend the Pap and pelvic exam, combined with testing for human papilloma virus (HPV), every 5 years. Some types of HPV increase your risk of cervical cancer. Testing for HPV may also be done on women of any age with unclear Pap test results.  Other health care providers may not recommend any screening for nonpregnant women who are considered low risk for pelvic cancer and who do not have symptoms. Ask your health care provider if a screening pelvic exam is right for you.  If you have had past treatment for cervical cancer or a condition that could lead to cancer, you need Pap tests and screening for cancer for at least 20 years after your treatment. If Pap tests have been discontinued, your risk factors (such as having a new sexual partner) need to be reassessed to determine if screening should resume. Some women have medical problems that increase the chance of getting cervical cancer. In these cases, your health care provider may recommend more frequent screening and Pap tests. Colorectal Cancer  This type of cancer can be detected and often prevented.  Routine colorectal cancer screening usually begins at 61 years of age and continues through 61 years of age.  Your health care provider may recommend screening at an earlier age if you have risk factors for colon cancer.  Your health care provider may also recommend using home test kits  to check for hidden blood in the stool.  A small camera at the end of a tube can be used to examine your colon directly (sigmoidoscopy or colonoscopy). This is done to check for the earliest forms of colorectal cancer.  Routine screening usually begins at age 54.  Direct examination of the colon should be repeated every 5-10 years through 61 years of age. However, you may need to be screened more often if early forms of precancerous polyps or small growths are found. Skin Cancer  Check your skin from head to toe regularly.  Tell your health care provider about any new moles or changes in moles, especially if there is a change in a mole's shape or color.  Also tell your health care provider if you have a mole that is larger than the size of a pencil eraser.  Always use sunscreen. Apply sunscreen liberally and repeatedly throughout the day.  Protect yourself by wearing long sleeves, pants, a wide-brimmed hat,  and sunglasses whenever you are outside. HEART DISEASE, DIABETES, AND HIGH BLOOD PRESSURE   High blood pressure causes heart disease and increases the risk of stroke. High blood pressure is more likely to develop in:  People who have blood pressure in the high end of the normal range (130-139/85-89 mm Hg).  People who are overweight or obese.  People who are African American.  If you are 49-64 years of age, have your blood pressure checked every 3-5 years. If you are 68 years of age or older, have your blood pressure checked every year. You should have your blood pressure measured twice--once when you are at a hospital or clinic, and once when you are not at a hospital or clinic. Record the average of the two measurements. To check your blood pressure when you are not at a hospital or clinic, you can use:  An automated blood pressure machine at a pharmacy.  A home blood pressure monitor.  If you are between 34 years and 52 years old, ask your health care provider if you should  take aspirin to prevent strokes.  Have regular diabetes screenings. This involves taking a blood sample to check your fasting blood sugar level.  If you are at a normal weight and have a low risk for diabetes, have this test once every three years after 61 years of age.  If you are overweight and have a high risk for diabetes, consider being tested at a younger age or more often. PREVENTING INFECTION  Hepatitis B  If you have a higher risk for hepatitis B, you should be screened for this virus. You are considered at high risk for hepatitis B if:  You were born in a country where hepatitis B is common. Ask your health care provider which countries are considered high risk.  Your parents were born in a high-risk country, and you have not been immunized against hepatitis B (hepatitis B vaccine).  You have HIV or AIDS.  You use needles to inject street drugs.  You live with someone who has hepatitis B.  You have had sex with someone who has hepatitis B.  You get hemodialysis treatment.  You take certain medicines for conditions, including cancer, organ transplantation, and autoimmune conditions. Hepatitis C  Blood testing is recommended for:  Everyone born from 40 through 1965.  Anyone with known risk factors for hepatitis C. Sexually transmitted infections (STIs)  You should be screened for sexually transmitted infections (STIs) including gonorrhea and chlamydia if:  You are sexually active and are younger than 61 years of age.  You are older than 61 years of age and your health care provider tells you that you are at risk for this type of infection.  Your sexual activity has changed since you were last screened and you are at an increased risk for chlamydia or gonorrhea. Ask your health care provider if you are at risk.  If you do not have HIV, but are at risk, it may be recommended that you take a prescription medicine daily to prevent HIV infection. This is called  pre-exposure prophylaxis (PrEP). You are considered at risk if:  You are sexually active and do not regularly use condoms or know the HIV status of your partner(s).  You take drugs by injection.  You are sexually active with a partner who has HIV. Talk with your health care provider about whether you are at high risk of being infected with HIV. If you choose to begin PrEP, you should  first be tested for HIV. You should then be tested every 3 months for as long as you are taking PrEP.  PREGNANCY   If you are premenopausal and you may become pregnant, ask your health care provider about preconception counseling.  If you may become pregnant, take 400 to 800 micrograms (mcg) of folic acid every day.  If you want to prevent pregnancy, talk to your health care provider about birth control (contraception). OSTEOPOROSIS AND MENOPAUSE   Osteoporosis is a disease in which the bones lose minerals and strength with aging. This can result in serious bone fractures. Your risk for osteoporosis can be identified using a bone density scan.  If you are 2 years of age or older, or if you are at risk for osteoporosis and fractures, ask your health care provider if you should be screened.  Ask your health care provider whether you should take a calcium or vitamin D supplement to lower your risk for osteoporosis.  Menopause may have certain physical symptoms and risks.  Hormone replacement therapy may reduce some of these symptoms and risks. Talk to your health care provider about whether hormone replacement therapy is right for you.  HOME CARE INSTRUCTIONS   Schedule regular health, dental, and eye exams.  Stay current with your immunizations.   Do not use any tobacco products including cigarettes, chewing tobacco, or electronic cigarettes.  If you are pregnant, do not drink alcohol.  If you are breastfeeding, limit how much and how often you drink alcohol.  Limit alcohol intake to no more than 1  drink per day for nonpregnant women. One drink equals 12 ounces of beer, 5 ounces of wine, or 1 ounces of hard liquor.  Do not use street drugs.  Do not share needles.  Ask your health care provider for help if you need support or information about quitting drugs.  Tell your health care provider if you often feel depressed.  Tell your health care provider if you have ever been abused or do not feel safe at home.   This information is not intended to replace advice given to you by your health care provider. Make sure you discuss any questions you have with your health care provider.   Document Released: 07/11/2010 Document Revised: 01/16/2014 Document Reviewed: 11/27/2012 Elsevier Interactive Patient Education [employer].

## 2015-09-03 NOTE — Assessment & Plan Note (Signed)
Checking vitamin D level off her high dose replacement and encouraged otc replacement.

## 2015-09-03 NOTE — Assessment & Plan Note (Signed)
Recent mammogram okay, colonoscopy due in 2026. Declines all immunizations today. Declines hep c and hiv screening. Counseled on sun safety and the dangers of distracted driving. Given screening recommendations.

## 2015-09-03 NOTE — Assessment & Plan Note (Signed)
Checking HgA1c, lipid, CMP. Sees endo for management. Has recent eye exam and need to get those records.

## 2015-10-05 ENCOUNTER — Encounter: Payer: Self-pay | Admitting: *Deleted

## 2015-10-20 ENCOUNTER — Other Ambulatory Visit: Payer: Self-pay | Admitting: Endocrinology

## 2015-12-06 ENCOUNTER — Telehealth (HOSPITAL_COMMUNITY): Payer: Self-pay | Admitting: *Deleted

## 2015-12-06 NOTE — Telephone Encounter (Signed)
Patient called and left voicemail wanting to schedule appointment with [facility]. Attempted to call patient on both phone numbers provided and no one answered phone. Left voicemail for patient to call me back.

## 2015-12-23 ENCOUNTER — Encounter: Payer: Self-pay | Admitting: Internal Medicine

## 2015-12-23 ENCOUNTER — Ambulatory Visit (INDEPENDENT_AMBULATORY_CARE_PROVIDER_SITE_OTHER): Payer: BLUE CROSS/BLUE SHIELD | Admitting: Internal Medicine

## 2015-12-23 VITALS — BP 134/68 | HR 96 | Temp 98.5°F | Wt 153.0 lb

## 2015-12-23 DIAGNOSIS — R358 Other polyuria: Secondary | ICD-10-CM

## 2015-12-23 DIAGNOSIS — R300 Dysuria: Secondary | ICD-10-CM

## 2015-12-23 DIAGNOSIS — R3589 Other polyuria: Secondary | ICD-10-CM

## 2015-12-23 LAB — POC URINALSYSI DIPSTICK (AUTOMATED)
BILIRUBIN UA: NEGATIVE
Blood, UA: NEGATIVE
GLUCOSE UA: NEGATIVE
Ketones, UA: NEGATIVE
Leukocytes, UA: NEGATIVE
Nitrite, UA: NEGATIVE
Protein, UA: NEGATIVE
UROBILINOGEN UA: 0.2
pH, UA: 6

## 2015-12-23 MED ORDER — PHENAZOPYRIDINE HCL 100 MG PO TABS: 100.0000 mg | ORAL_TABLET | Freq: Three times a day (TID) | ORAL | 0 refills | Status: DC | PRN

## 2015-12-23 NOTE — Assessment & Plan Note (Signed)
No sign of infection on U/A in the office and also no glucosuria on exam. Rx for pyridium for the pain and if no resolution will recheck U/a for infection.

## 2015-12-23 NOTE — Progress Notes (Signed)
   Subjective:    Patient ID: Lindsay Henderson, female    DOB: 12-22-1955, 60 y.o.   MRN: [id number]  HPI The patient is a 60 YO female coming in for UTI symptoms. Going on for about 1 week. She is having frequency and urgency and burning. Denies fevers or chills. Denies back pain. She has been drinking water and some cranberry juice. She has had these in the past.   Review of Systems  Constitutional: Negative.   Respiratory: Negative.   Cardiovascular: Negative.   Gastrointestinal: Positive for abdominal pain.  Genitourinary: Positive for dysuria, frequency and urgency. Negative for difficulty urinating, flank pain and pelvic pain.  Musculoskeletal: Negative.       Objective:   Physical Exam  Constitutional: She is oriented to person, place, and time. She appears well-developed and well-nourished.  HENT:  Head: Normocephalic and atraumatic.  Eyes: EOM are normal.  Cardiovascular: Normal rate and regular rhythm.   Pulmonary/Chest: Effort normal. No respiratory distress. She has no wheezes. She has no rales.  Abdominal: Soft. She exhibits no distension. There is tenderness. There is no rebound and no guarding.  Supra pubic tenderness.   Neurological: She is alert and oriented to person, place, and time.  Skin: Skin is warm and dry.   Vitals:   12/23/15 0829  BP: 134/68  Pulse: 96  Temp: 98.5 F (36.9 C)  SpO2: 98%  Weight: 153 lb (69.4 kg)      Assessment & Plan:

## 2015-12-23 NOTE — Patient Instructions (Signed)
You do not have signs of infection in the urine so we have sent in a medicine to help with the pain.   It is called pyridium and you can take it up to 3 times per day to help with pain for up to 1 week.   Keep drinking more water to help flush out your system.

## 2015-12-23 NOTE — Progress Notes (Signed)
Pre visit review using our clinic review tool, if applicable. No additional management support is needed unless otherwise documented below in the visit note. 

## 2015-12-31 LAB — HM MAMMOGRAPHY

## 2016-01-05 ENCOUNTER — Encounter: Payer: Self-pay | Admitting: Internal Medicine

## 2016-01-05 NOTE — Progress Notes (Unsigned)
Results entered and sent to scan  

## 2016-03-09 ENCOUNTER — Ambulatory Visit: Payer: BLUE CROSS/BLUE SHIELD | Admitting: Internal Medicine

## 2016-03-23 ENCOUNTER — Ambulatory Visit: Payer: BLUE CROSS/BLUE SHIELD | Admitting: Internal Medicine

## 2016-03-24 ENCOUNTER — Ambulatory Visit (INDEPENDENT_AMBULATORY_CARE_PROVIDER_SITE_OTHER): Payer: BLUE CROSS/BLUE SHIELD | Admitting: Internal Medicine

## 2016-03-24 ENCOUNTER — Other Ambulatory Visit (INDEPENDENT_AMBULATORY_CARE_PROVIDER_SITE_OTHER): Payer: BLUE CROSS/BLUE SHIELD

## 2016-03-24 ENCOUNTER — Encounter: Payer: Self-pay | Admitting: Internal Medicine

## 2016-03-24 VITALS — BP 140/76 | HR 101 | Temp 98.5°F | Ht 62.0 in | Wt 153.0 lb

## 2016-03-24 DIAGNOSIS — E119 Type 2 diabetes mellitus without complications: Secondary | ICD-10-CM | POA: Diagnosis not present

## 2016-03-24 LAB — HEMOGLOBIN A1C: HEMOGLOBIN A1C: 8.1 % — AB (ref 4.6–6.5)

## 2016-03-24 MED ORDER — ALBUTEROL SULFATE HFA 108 (90 BASE) MCG/ACT IN AERS: 2.0000 | INHALATION_SPRAY | Freq: Four times a day (QID) | RESPIRATORY_TRACT | 2 refills | Status: DC | PRN

## 2016-03-24 MED ORDER — PRAVASTATIN SODIUM 20 MG PO TABS: 20.0000 mg | ORAL_TABLET | Freq: Every day | ORAL | 3 refills | Status: DC

## 2016-03-24 NOTE — Assessment & Plan Note (Addendum)
Needs to be on cholesterol medicine and does not want to take lipitor. Rx for pravastatin to avoid muscle cramps. Checking HgA1c and adjust her metformin and prandin and onglyza as needed since she has not been to endo in almost 1 year. Last HgA1c 7.7 which is close to goal. Declines pneumonia shot today.

## 2016-03-24 NOTE — Patient Instructions (Signed)
We will check the sugars today and have sent in the new cholesterol medicine called pravastatin.   Take 1 pill daily and if you have any problems call us back.

## 2016-03-24 NOTE — Progress Notes (Signed)
   Subjective:    Patient ID: Lindsay Henderson, female    DOB: Feb 09, 1955, 61 y.o.   MRN: [id number]  HPI The patient is a 61 YO female coming in for follow up of her diabetes. She is still taking her metformin without side effects. Has not made many changes to her diet. She is not taking the cholesterol medicine we prescribed for her as it gave her muscle cramps in the past. She denies new numbness in her feet or burning. She has not been back to her endo doctor but will make a visit with them.   Review of Systems  Constitutional: Negative for activity change, appetite change, diaphoresis, fatigue, fever and unexpected weight change.  Respiratory: Negative.   Cardiovascular: Negative.   Gastrointestinal: Negative.   Musculoskeletal: Negative.   Neurological: Negative.       Objective:   Physical Exam  Constitutional: She is oriented to person, place, and time. She appears well-developed and well-nourished.  HENT:  Head: Normocephalic and atraumatic.  Eyes: EOM are normal.  Cardiovascular: Normal rate and regular rhythm.   Pulmonary/Chest: Effort normal. No respiratory distress. She has no wheezes. She has no rales.  Abdominal: Soft.  Neurological: She is alert and oriented to person, place, and time.  Skin: Skin is warm and dry.   Vitals:   03/24/16 0759  BP: 140/76  Pulse: (!) 101  Temp: 98.5 F (36.9 C)  TempSrc: Oral  SpO2: 99%  Weight: 153 lb (69.4 kg)  Height: 5\' 2"  (1.575 m)      Assessment & Plan:

## 2016-03-24 NOTE — Progress Notes (Signed)
Pre visit review using our clinic review tool, if applicable. No additional management support is needed unless otherwise documented below in the visit note. 

## 2016-04-14 ENCOUNTER — Other Ambulatory Visit: Payer: Self-pay | Admitting: Endocrinology

## 2016-04-14 ENCOUNTER — Other Ambulatory Visit: Payer: Self-pay

## 2016-04-14 MED ORDER — METFORMIN HCL 500 MG PO TABS: ORAL_TABLET | ORAL | 2 refills | Status: DC

## 2016-05-09 ENCOUNTER — Ambulatory Visit (INDEPENDENT_AMBULATORY_CARE_PROVIDER_SITE_OTHER): Payer: BLUE CROSS/BLUE SHIELD | Admitting: Endocrinology

## 2016-05-09 ENCOUNTER — Encounter: Payer: Self-pay | Admitting: Endocrinology

## 2016-05-09 VITALS — BP 122/70 | HR 87 | Ht 62.0 in | Wt 154.0 lb

## 2016-05-09 DIAGNOSIS — L299 Pruritus, unspecified: Secondary | ICD-10-CM

## 2016-05-09 DIAGNOSIS — Z8742 Personal history of other diseases of the female genital tract: Secondary | ICD-10-CM | POA: Diagnosis not present

## 2016-05-09 DIAGNOSIS — E119 Type 2 diabetes mellitus without complications: Secondary | ICD-10-CM

## 2016-05-09 DIAGNOSIS — E11319 Type 2 diabetes mellitus with unspecified diabetic retinopathy without macular edema: Secondary | ICD-10-CM

## 2016-05-09 MED ORDER — DAPAGLIFLOZIN PROPANEDIOL 5 MG PO TABS: 5.0000 mg | ORAL_TABLET | Freq: Every day | ORAL | 11 refills | Status: DC

## 2016-05-09 MED ORDER — FLUCONAZOLE 150 MG PO TABS: 150.0000 mg | ORAL_TABLET | Freq: Once | ORAL | 3 refills | Status: DC

## 2016-05-09 MED ORDER — CLOTRIMAZOLE-BETAMETHASONE 1-0.05 % EX CREA: 1.0000 "application " | TOPICAL_CREAM | Freq: Two times a day (BID) | CUTANEOUS | 2 refills | Status: AC

## 2016-05-09 NOTE — Patient Instructions (Addendum)
i have sent prescriptions to your pharmacy: to add "farxiga," for the yeast infection, and for the foot itching.  Please come back for a follow-up appointment in 1 month.  check your blood sugar once a day.  vary the time of day when you check, between before the 3 meals, and at bedtime.  also check if you have symptoms of your blood sugar being too high or too low.  please keep a record of the readings and bring it to your next appointment here.  You can write it on any piece of paper.  please call us sooner if your blood sugar goes below 70, or if you have a lot of readings over 200.

## 2016-05-09 NOTE — Progress Notes (Signed)
Subjective:    Patient ID: Mercy Riding, female    DOB: Feb 27, 1955, 61 y.o.   MRN: [id number]  HPI Pt returns for f/u of diabetes mellitus: DM type: 2 Dx'ed: 8315 Complications: retinopathy Therapy: 3 oral meds GDM: (1987 and 1992) DKA: never Severe hypoglycemia: never Pancreatitis: never Other: she took insulin only for GDM; she could not afford invokana; she did not tolerate glimepiride (hypoglycemia).   Interval history: no cbg record, but states cbg's vary from 160-220.  pt states she feels well in general, except for itching of the feet. She says she has h/o vaginal yeast infections.   Past Medical History:  Diagnosis Date  . Adenomatous colon polyp 11/2005  . Allergic rhinitis   . Allergy   . Anomalous atrioventricular excitation   . Anxiety    no per pt  . Asthma   . Diverticulosis of colon   . Gastritis   . GERD (gastroesophageal reflux disease)   . History of supraventricular tachycardia   . Hypercholesteremia   . Kidney stones   . Nephrolithiasis   . Renal cyst   . Somatic dysfunction   . Type II or unspecified type diabetes mellitus without mention of complication, not stated as uncontrolled   . Vitamin D deficiency     Past Surgical History:  Procedure Laterality Date  . CARDIAC ELECTROPHYSIOLOGY [place] AND ABLATION  2004  . COLONOSCOPY  2007  . TUBAL LIGATION      Social History   Social History  . Marital status: Married    Spouse name: N/A  . Number of children: 4  . Years of education: N/A   Occupational History  . [employer]    Social History Main Topics  . Smoking status: Never Smoker  . Smokeless tobacco: Never Used  . Alcohol use No  . Drug use: No  . Sexual activity: Not Currently   Other Topics Concern  . Not on file   Social History Narrative  . No narrative on file    Current Outpatient Prescriptions on File Prior to Visit  Medication Sig Dispense Refill  . albuterol (PROAIR HFA) 108 (90 Base) MCG/ACT  inhaler Inhale 2 puffs into the lungs every 6 (six) hours as needed. 1 Inhaler 2  . esomeprazole (NEXIUM) 20 MG capsule Take 20 mg by mouth daily at 12 noon.    . fexofenadine ([name]) 180 MG tablet Take 180 mg by mouth daily.    . fluticasone (FLONASE) 50 MCG/ACT nasal spray Place 2 sprays into both nostrils daily.    . metFORMIN (GLUCOPHAGE) 500 MG tablet TAKE TWO TABLETS BY MOUTH TWICE DAILY WITH  MEALS 120 tablet 2  . repaglinide (PRANDIN) 2 MG tablet Take 1 tablet (2 mg total) by mouth 3 (three) times daily before meals. 90 tablet 11  . saxagliptin HCl (ONGLYZA) 5 MG TABS tablet Take 1 tablet (5 mg total) by mouth daily. 30 tablet 11  . beclomethasone (QVAR) 80 MCG/ACT inhaler Inhale 1 puff into the lungs 2 (two) times daily. (Patient not taking: Reported on 05/09/2016) 1 Inhaler 6  . pravastatin (PRAVACHOL) 20 MG tablet Take 1 tablet (20 mg total) by mouth daily. (Patient not taking: Reported on 05/09/2016) 90 tablet 3   No current facility-administered medications on file prior to visit.     Allergies  Allergen Reactions  . Parlodel [Bromocriptine Mesylate] Nausea Only    nausea  . Peanut-Containing Drug Products Hives, Itching and Swelling  . Penicillins     REACTION:  rash and sob  . Pioglitazone Diarrhea    tremor  . Sulfa Antibiotics Hives  . Zithromax [Azithromycin] Nausea And Vomiting    Family History  Problem Relation Age of Onset  . Diabetes Father   . Hypertension Father   . Hyperlipidemia Father   . Breast cancer Mother   . Esophageal cancer Neg Hx   . Stomach cancer Neg Hx   . Colon cancer Neg Hx   . Rectal cancer Neg Hx     BP 122/70   Pulse 87   Ht 5\' 2"  (1.575 m)   Wt 154 lb (69.9 kg)   SpO2 97%   BMI 28.17 kg/m    Review of Systems She denies hypoglycemia.      Objective:   Physical Exam VITAL SIGNS:  See vs page GENERAL: no distress Pulses: dorsalis pedis intact bilat.   MSK: no deformity of the feet CV: no leg edema Skin:  no ulcer on the  feet.  normal color and temp on the feet. Neuro: sensation is intact to touch on the feet.     Lab Results  Component Value Date   HGBA1C 8.1 (H) 03/24/2016   Lab Results  Component Value Date   CREATININE 0.69 09/03/2015   BUN 9 09/03/2015   NA 139 09/03/2015   K 4.0 09/03/2015   CL 102 09/03/2015   CO2 29 09/03/2015      Assessment & Plan:  Type 2 DM, with retinopathy: she needs increased rx.  Vaginitis, by hx.  We'll add farxiga, on a trial basis.  Foot itching, new: I told pt I'll try topical rx, but then I need to drop back and rx just the DM.    Patient Instructions  i have sent prescriptions to your pharmacy: to add "farxiga," for the yeast infection, and for the foot itching.  Please come back for a follow-up appointment in 1 month.  check your blood sugar once a day.  vary the time of day when you check, between before the 3 meals, and at bedtime.  also check if you have symptoms of your blood sugar being too high or too low.  please keep a record of the readings and bring it to your next appointment here.  You can write it on any piece of paper.  please call us sooner if your blood sugar goes below 70, or if you have a lot of readings over 200.

## 2016-05-13 ENCOUNTER — Other Ambulatory Visit: Payer: Self-pay | Admitting: Endocrinology

## 2016-05-15 ENCOUNTER — Other Ambulatory Visit: Payer: Self-pay

## 2016-05-16 ENCOUNTER — Telehealth: Payer: Self-pay | Admitting: Endocrinology

## 2016-05-16 MED ORDER — EMPAGLIFLOZIN 10 MG PO TABS: 10.0000 mg | ORAL_TABLET | Freq: Every day | ORAL | 11 refills | Status: DC

## 2016-05-16 NOTE — Telephone Encounter (Signed)
please call patient: Ins wants you to take jardiance rather than farxiga.  I have sent a prescription to your pharmacy.  I'll see you next time.

## 2016-05-16 NOTE — Telephone Encounter (Signed)
Left detailed message on her vm and requested a call back if she had any questions

## 2016-06-09 ENCOUNTER — Other Ambulatory Visit: Payer: Self-pay | Admitting: Endocrinology

## 2016-06-09 ENCOUNTER — Ambulatory Visit: Payer: BLUE CROSS/BLUE SHIELD | Admitting: Endocrinology

## 2016-08-19 ENCOUNTER — Encounter: Payer: Self-pay | Admitting: Family Medicine

## 2016-08-19 ENCOUNTER — Ambulatory Visit (INDEPENDENT_AMBULATORY_CARE_PROVIDER_SITE_OTHER): Payer: BLUE CROSS/BLUE SHIELD | Admitting: Family Medicine

## 2016-08-19 VITALS — BP 120/66 | HR 109 | Temp 98.5°F | Wt 152.0 lb

## 2016-08-19 DIAGNOSIS — J019 Acute sinusitis, unspecified: Secondary | ICD-10-CM | POA: Insufficient documentation

## 2016-08-19 DIAGNOSIS — J0100 Acute maxillary sinusitis, unspecified: Secondary | ICD-10-CM

## 2016-08-19 MED ORDER — DOXYCYCLINE HYCLATE 100 MG PO TABS: 100.0000 mg | ORAL_TABLET | Freq: Two times a day (BID) | ORAL | 0 refills | Status: DC

## 2016-08-19 MED ORDER — FLUCONAZOLE 150 MG PO TABS: 150.0000 mg | ORAL_TABLET | Freq: Once | ORAL | 0 refills | Status: AC

## 2016-08-19 NOTE — Progress Notes (Signed)
   Subjective:    Patient ID: Lindsay Henderson, female    DOB: February 05, 1955, 61 y.o.   MRN: [id number]  Sore Throat   Associated symptoms include congestion, coughing, ear pain, headaches and a plugged ear sensation. Pertinent negatives include no shortness of breath.  Sinusitis  This is a new problem. The current episode started in the past 7 days. The problem has been gradually worsening since onset. There has been no fever. Associated symptoms include congestion, coughing, ear pain, headaches and sinus pressure. Pertinent negatives include no chills, shortness of breath or sore throat. Past treatments include oral decongestants (aleve,allegra, flonase, sudafed). The treatment provided no relief.  Ear Fullness   Associated symptoms include coughing and headaches. Pertinent negatives include no sore throat.    Blood pressure 120/66, pulse (!) 109, temperature 98.5 F (36.9 C), temperature source Oral, weight 152 lb (68.9 kg), SpO2 97 %.    no recent antibiotics.  Review of Systems  Constitutional: Negative for chills.  HENT: Positive for congestion, ear pain and sinus pressure. Negative for sore throat.   Respiratory: Positive for cough. Negative for shortness of breath.   Neurological: Positive for headaches.       Objective:   Physical Exam  Constitutional: Vital signs are normal. She appears well-developed and well-nourished. She is cooperative.  Non-toxic appearance. She does not appear ill. No distress.  HENT:  Head: Normocephalic.  Right Ear: Hearing, external ear and ear canal normal. Tympanic membrane is not erythematous, not retracted and not bulging. A middle ear effusion is present.  Left Ear: Hearing, external ear and ear canal normal. Tympanic membrane is not erythematous, not retracted and not bulging. A middle ear effusion is present.  Nose: Mucosal edema and rhinorrhea present. Right sinus exhibits maxillary sinus tenderness. Right sinus exhibits no frontal sinus  tenderness. Left sinus exhibits maxillary sinus tenderness. Left sinus exhibits no frontal sinus tenderness.  Mouth/Throat: Uvula is midline, oropharynx is clear and moist and mucous membranes are normal.  Eyes: Pupils are equal, round, and reactive to light. Conjunctivae, EOM and lids are normal. Lids are everted and swept, no foreign bodies found.  Neck: Trachea normal and normal range of motion. Neck supple. Carotid bruit is not present. No thyroid mass and no thyromegaly present.  Cardiovascular: Normal rate, regular rhythm, S1 normal, S2 normal, normal heart sounds, intact distal pulses and normal pulses.  Exam reveals no gallop and no friction rub.   No murmur heard. Pulmonary/Chest: Effort normal and breath sounds normal. No tachypnea. No respiratory distress. She has no decreased breath sounds. She has no wheezes. She has no rhonchi. She has no rales.  Neurological: She is alert.  Skin: Skin is warm, dry and intact. No rash noted.  Psychiatric: Her speech is normal and behavior is normal. Judgment normal. Her mood appears not anxious. Cognition and memory are normal. She does not exhibit a depressed mood.          Assessment & Plan:

## 2016-08-19 NOTE — Assessment & Plan Note (Signed)
7 days of symptoms.. Using decongestant.  Add nasal saline and mucolytic.  If still not improiving at day 10.. Start antibiotics.

## 2016-08-19 NOTE — Patient Instructions (Signed)
Continue allegra and flonase.  Mucinex along with the decongestant. Do saline spray 2-3 times daily.  If not improing in 2-3 days.Venida Jarvis the antibiotics.

## 2016-08-26 ENCOUNTER — Other Ambulatory Visit: Payer: Self-pay | Admitting: Internal Medicine

## 2016-10-16 ENCOUNTER — Encounter (HOSPITAL_COMMUNITY): Payer: Self-pay

## 2016-11-01 ENCOUNTER — Other Ambulatory Visit (INDEPENDENT_AMBULATORY_CARE_PROVIDER_SITE_OTHER): Payer: BLUE CROSS/BLUE SHIELD

## 2016-11-01 ENCOUNTER — Encounter: Payer: Self-pay | Admitting: Internal Medicine

## 2016-11-01 ENCOUNTER — Ambulatory Visit (INDEPENDENT_AMBULATORY_CARE_PROVIDER_SITE_OTHER): Payer: BLUE CROSS/BLUE SHIELD | Admitting: Internal Medicine

## 2016-11-01 VITALS — BP 138/80 | HR 85 | Temp 98.2°F | Ht 62.0 in | Wt 151.0 lb

## 2016-11-01 DIAGNOSIS — J0100 Acute maxillary sinusitis, unspecified: Secondary | ICD-10-CM

## 2016-11-01 DIAGNOSIS — E119 Type 2 diabetes mellitus without complications: Secondary | ICD-10-CM

## 2016-11-01 LAB — LIPID PANEL
CHOLESTEROL: 215 mg/dL — AB (ref 0–200)
HDL: 47.1 mg/dL (ref >39.00)
LDL Cholesterol: 145 mg/dL — ABNORMAL HIGH (ref 0–99)
NonHDL: 167.55
TRIGLYCERIDES: 112 mg/dL (ref 0.0–149.0)
Total CHOL/HDL Ratio: 5
VLDL: 22.4 mg/dL (ref 0.0–40.0)

## 2016-11-01 LAB — CBC
HCT: 42.6 % (ref 36.0–46.0)
Hemoglobin: 14.2 g/dL (ref 12.0–15.0)
MCHC: 33.2 g/dL (ref 30.0–36.0)
MCV: 89.2 fl (ref 78.0–100.0)
PLATELETS: 255 10*3/uL (ref 150.0–400.0)
RBC: 4.78 Mil/uL (ref 3.87–5.11)
RDW: 13.8 % (ref 11.5–15.5)
WBC: 8.1 10*3/uL (ref 4.0–10.5)

## 2016-11-01 LAB — HEMOGLOBIN A1C: Hgb A1c MFr Bld: 8.3 % — ABNORMAL HIGH (ref 4.6–6.5)

## 2016-11-01 LAB — MICROALBUMIN / CREATININE URINE RATIO
Creatinine,U: 140.5 mg/dL
MICROALB UR: 1.6 mg/dL (ref 0.0–1.9)
MICROALB/CREAT RATIO: 1.2 mg/g (ref 0.0–30.0)

## 2016-11-01 LAB — COMPREHENSIVE METABOLIC PANEL
ALK PHOS: 71 U/L (ref 39–117)
ALT: 18 U/L (ref 0–35)
AST: 16 U/L (ref 0–37)
Albumin: 4.3 g/dL (ref 3.5–5.2)
BUN: 10 mg/dL (ref 6–23)
CALCIUM: 9.4 mg/dL (ref 8.4–10.5)
CO2: 29 mEq/L (ref 19–32)
CREATININE: 0.69 mg/dL (ref 0.40–1.20)
Chloride: 101 mEq/L (ref 96–112)
GFR: 110.8 mL/min (ref >60.00)
Glucose, Bld: 190 mg/dL — ABNORMAL HIGH (ref 70–99)
Potassium: 4 mEq/L (ref 3.5–5.1)
Sodium: 139 mEq/L (ref 135–145)
TOTAL PROTEIN: 7.3 g/dL (ref 6.0–8.3)
Total Bilirubin: 0.5 mg/dL (ref 0.2–1.2)

## 2016-11-01 MED ORDER — DOXYCYCLINE HYCLATE 100 MG PO TABS: 100.0000 mg | ORAL_TABLET | Freq: Two times a day (BID) | ORAL | 0 refills | Status: DC

## 2016-11-01 NOTE — Assessment & Plan Note (Signed)
Needs labs as none in some time. Will follow up at physical within a month for therapy adjustment.

## 2016-11-01 NOTE — Patient Instructions (Signed)
We have sent in the doxycycline to start taking 1 pill twice a day.

## 2016-11-01 NOTE — Assessment & Plan Note (Addendum)
Rx for doxycycline for pcn allergy. No indication for prednisone as no asthma flare today. Continue flonase and allegra.

## 2016-11-01 NOTE — Progress Notes (Signed)
   Subjective:    Patient ID: Lindsay Henderson, female    DOB: November 05, 1955, 61 y.o.   MRN: [id number]  HPI The patient is a 61 YO female coming in for cough and cold symptoms. Some sore throat, ear pain, has sinus pressure. Started about 7-8 days ago. She does have concurrent asthma and has needed her albuterol more often lately for breathing. Denies significant sputum but dry cough. 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 She is having headaches and sinus pressure. Overall is slightly worse since onset. She has tried Human resources officer and flonase without relief and ibuprofen for headaches. Some mild fever of 100 the other day.   Review of Systems  Constitutional: Positive for activity change, appetite change, chills and fever.  HENT: Positive for congestion, ear pain, postnasal drip, rhinorrhea, sinus pain, sinus pressure, sore throat and voice change. Negative for dental problem, ear discharge, tinnitus and trouble swallowing.   Eyes: Negative.   Respiratory: Positive for cough and shortness of breath. Negative for chest tightness.   Cardiovascular: Negative for chest pain, palpitations and leg swelling.  Gastrointestinal: Negative for abdominal distention, abdominal pain, constipation, diarrhea, nausea and vomiting.  Musculoskeletal: Negative.   Skin: Negative.       Objective:   Physical Exam  Constitutional: She is oriented to person, place, and time. She appears well-developed and well-nourished.  HENT:  Head: Normocephalic and atraumatic.  Right Ear: External ear normal.  Left Ear: External ear normal.  Sinus pain frontal, oropharynx with redness and clear drainage, nose with turbinate swelling and crusting.   Eyes: EOM are normal.  Neck: Normal range of motion. No thyromegaly present.  Cardiovascular: Normal rate and regular rhythm.   Pulmonary/Chest: Effort normal. No respiratory distress. She has wheezes. She has no rales.  Mild expiratory wheezing  which clears with cough  Abdominal: Soft.  Musculoskeletal: She exhibits no edema.  Lymphadenopathy:    She has no cervical adenopathy.  Neurological: She is alert and oriented to person, place, and time. Coordination normal.  Skin: Skin is warm and dry.   Vitals:   11/01/16 0851  BP: 138/80  Pulse: 85  Temp: 98.2 F (36.8 C)  TempSrc: Oral  SpO2: 99%  Weight: 151 lb (68.5 kg)  Height: 5\' 2"  (1.575 m)      Assessment & Plan:

## 2016-11-03 ENCOUNTER — Telehealth: Payer: Self-pay | Admitting: Internal Medicine

## 2016-11-03 MED ORDER — EMPAGLIFLOZIN 25 MG PO TABS: 25.0000 mg | ORAL_TABLET | Freq: Every day | ORAL | 6 refills | Status: DC

## 2016-11-03 NOTE — Telephone Encounter (Signed)
Pt was given lab results from 10/24, expressed understanding, she is willing to make changes, she would like a voicemail left on her home phone telling her what medication will be called in. Please advise and call back.

## 2016-11-03 NOTE — Telephone Encounter (Signed)
We have increased the dose of the jardiance to 25 mg daily. She can take 2 pills of the jardiance she has now daily until gone then fill the higher dose. Other meds today.

## 2016-11-06 NOTE — Telephone Encounter (Signed)
Patient notified as instructed, verbalized understanding. Patient states she was not taking her medications as directed the reason her sugars were increased. She wishes to discuss further at her Annual appointment 11/28/16.

## 2016-11-06 NOTE — Telephone Encounter (Signed)
Patient verbalized understanding to increase jardiance dose as instructed.

## 2016-11-06 NOTE — Telephone Encounter (Signed)
Still recommend to increase the jardiance dose

## 2016-11-28 ENCOUNTER — Encounter: Payer: BLUE CROSS/BLUE SHIELD | Admitting: Internal Medicine

## 2016-12-11 ENCOUNTER — Encounter: Payer: Self-pay | Admitting: Internal Medicine

## 2016-12-11 ENCOUNTER — Ambulatory Visit (INDEPENDENT_AMBULATORY_CARE_PROVIDER_SITE_OTHER): Payer: BLUE CROSS/BLUE SHIELD | Admitting: Internal Medicine

## 2016-12-11 DIAGNOSIS — E1169 Type 2 diabetes mellitus with other specified complication: Secondary | ICD-10-CM | POA: Diagnosis not present

## 2016-12-11 DIAGNOSIS — Z0000 Encounter for general adult medical examination without abnormal findings: Secondary | ICD-10-CM | POA: Diagnosis not present

## 2016-12-11 DIAGNOSIS — E785 Hyperlipidemia, unspecified: Secondary | ICD-10-CM | POA: Diagnosis not present

## 2016-12-11 NOTE — Patient Instructions (Signed)
We would like you to try to take the prandin twice a day, do not take jardiance.    Health Maintenance, Female Adopting a healthy lifestyle and getting preventive care can go a long way to promote health and wellness. Talk with your health care provider about what schedule of regular examinations is right for you. This is a good chance for you to check in with your provider about disease prevention and staying healthy. In between checkups, there are plenty of things you can do on your own. Experts have done a lot of research about which lifestyle changes and preventive measures are most likely to keep you healthy. Ask your health care provider for more information. Weight and diet Eat a healthy diet  Be sure to include plenty of vegetables, fruits, low-fat dairy products, and lean protein.  Do not eat a lot of foods high in solid fats, added sugars, or salt.  Get regular exercise. This is one of the most important things you can do for your health. ? Most adults should exercise for at least 150 minutes each week. The exercise should increase your heart rate and make you sweat (moderate-intensity exercise). ? Most adults should also do strengthening exercises at least twice a week. This is in addition to the moderate-intensity exercise.  Maintain a healthy weight  Body mass index (BMI) is a measurement that can be used to identify possible weight problems. It estimates body fat based on height and weight. Your health care provider can help determine your BMI and help you achieve or maintain a healthy weight.  For females 7 years of age and older: ? A BMI below 18.5 is considered underweight. ? A BMI of 18.5 to 24.9 is normal. ? A BMI of 25 to 29.9 is considered overweight. ? A BMI of 30 and above is considered obese.  Watch levels of cholesterol and blood lipids  You should start having your blood tested for lipids and cholesterol at 62 years of age, then have this test every 5  years.  You may need to have your cholesterol levels checked more often if: ? Your lipid or cholesterol levels are high. ? You are older than 62 years of age. ? You are at high risk for heart disease.  Cancer screening Lung Cancer  Lung cancer screening is recommended for adults 71-73 years old who are at high risk for lung cancer because of a history of smoking.  A yearly low-dose CT scan of the lungs is recommended for people who: ? Currently smoke. ? Have quit within the past 15 years. ? Have at least a 30-pack-year history of smoking. A pack year is smoking an average of one pack of cigarettes a day for 1 year.  Yearly screening should continue until it has been 15 years since you quit.  Yearly screening should stop if you develop a health problem that would prevent you from having lung cancer treatment.  Breast Cancer  Practice breast self-awareness. This means understanding how your breasts normally appear and feel.  It also means doing regular breast self-exams. Let your health care provider know about any changes, no matter how small.  If you are in your 20s or 30s, you should have a clinical breast exam (CBE) by a health care provider every 1-3 years as part of a regular health exam.  If you are 50 or older, have a CBE every year. Also consider having a breast X-ray (mammogram) every year.  If you have a family  history of breast cancer, talk to your health care provider about genetic screening.  If you are at high risk for breast cancer, talk to your health care provider about having an MRI and a mammogram every year.  Breast cancer gene (BRCA) assessment is recommended for women who have family members with BRCA-related cancers. BRCA-related cancers include: ? Breast. ? Ovarian. ? Tubal. ? Peritoneal cancers.  Results of the assessment will determine the need for genetic counseling and BRCA1 and BRCA2 testing.  Cervical Cancer Your health care provider may  recommend that you be screened regularly for cancer of the pelvic organs (ovaries, uterus, and vagina). This screening involves a pelvic examination, including checking for microscopic changes to the surface of your cervix (Pap test). You may be encouraged to have this screening done every 3 years, beginning at age 83.  For women ages 16-65, health care providers may recommend pelvic exams and Pap testing every 3 years, or they may recommend the Pap and pelvic exam, combined with testing for human papilloma virus (HPV), every 5 years. Some types of HPV increase your risk of cervical cancer. Testing for HPV may also be done on women of any age with unclear Pap test results.  Other health care providers may not recommend any screening for nonpregnant women who are considered low risk for pelvic cancer and who do not have symptoms. Ask your health care provider if a screening pelvic exam is right for you.  If you have had past treatment for cervical cancer or a condition that could lead to cancer, you need Pap tests and screening for cancer for at least 20 years after your treatment. If Pap tests have been discontinued, your risk factors (such as having a new sexual partner) need to be reassessed to determine if screening should resume. Some women have medical problems that increase the chance of getting cervical cancer. In these cases, your health care provider may recommend more frequent screening and Pap tests.  Colorectal Cancer  This type of cancer can be detected and often prevented.  Routine colorectal cancer screening usually begins at 62 years of age and continues through 61 years of age.  Your health care provider may recommend screening at an earlier age if you have risk factors for colon cancer.  Your health care provider may also recommend using home test kits to check for hidden blood in the stool.  A small camera at the end of a tube can be used to examine your colon directly  (sigmoidoscopy or colonoscopy). This is done to check for the earliest forms of colorectal cancer.  Routine screening usually begins at age 43.  Direct examination of the colon should be repeated every 5-10 years through 62 years of age. However, you may need to be screened more often if early forms of precancerous polyps or small growths are found.  Skin Cancer  Check your skin from head to toe regularly.  Tell your health care provider about any new moles or changes in moles, especially if there is a change in a mole's shape or color.  Also tell your health care provider if you have a mole that is larger than the size of a pencil eraser.  Always use sunscreen. Apply sunscreen liberally and repeatedly throughout the day.  Protect yourself by wearing long sleeves, pants, a wide-brimmed hat, and sunglasses whenever you are outside.  Heart disease, diabetes, and high blood pressure  High blood pressure causes heart disease and increases the risk of stroke.  High blood pressure is more likely to develop in: ? People who have blood pressure in the high end of the normal range (130-139/85-89 mm Hg). ? People who are overweight or obese. ? People who are African American.  If you are 48-73 years of age, have your blood pressure checked every 3-5 years. If you are 45 years of age or older, have your blood pressure checked every year. You should have your blood pressure measured twice-once when you are at a hospital or clinic, and once when you are not at a hospital or clinic. Record the average of the two measurements. To check your blood pressure when you are not at a hospital or clinic, you can use: ? An automated blood pressure machine at a pharmacy. ? A home blood pressure monitor.  If you are between 11 years and 45 years old, ask your health care provider if you should take aspirin to prevent strokes.  Have regular diabetes screenings. This involves taking a blood sample to check your  fasting blood sugar level. ? If you are at a normal weight and have a low risk for diabetes, have this test once every three years after 62 years of age. ? If you are overweight and have a high risk for diabetes, consider being tested at a younger age or more often. Preventing infection Hepatitis B  If you have a higher risk for hepatitis B, you should be screened for this virus. You are considered at high risk for hepatitis B if: ? You were born in a country where hepatitis B is common. Ask your health care provider which countries are considered high risk. ? Your parents were born in a high-risk country, and you have not been immunized against hepatitis B (hepatitis B vaccine). ? You have HIV or AIDS. ? You use needles to inject street drugs. ? You live with someone who has hepatitis B. ? You have had sex with someone who has hepatitis B. ? You get hemodialysis treatment. ? You take certain medicines for conditions, including cancer, organ transplantation, and autoimmune conditions.  Hepatitis C  Blood testing is recommended for: ? Everyone born from [birth year] through 1966. ? Anyone with known risk factors for hepatitis C.  Sexually transmitted infections (STIs)  You should be screened for sexually transmitted infections (STIs) including gonorrhea and chlamydia if: ? You are sexually active and are younger than 62 years of age. ? You are older than 62 years of age and your health care provider tells you that you are at risk for this type of infection. ? Your sexual activity has changed since you were last screened and you are at an increased risk for chlamydia or gonorrhea. Ask your health care provider if you are at risk.  If you do not have HIV, but are at risk, it may be recommended that you take a prescription medicine daily to prevent HIV infection. This is called pre-exposure prophylaxis (PrEP). You are considered at risk if: ? You are sexually active and do not regularly use condoms  or know the HIV status of your partner(s). ? You take drugs by injection. ? You are sexually active with a partner who has HIV.  Talk with your health care provider about whether you are at high risk of being infected with HIV. If you choose to begin PrEP, you should first be tested for HIV. You should then be tested every 3 months for as long as you are taking PrEP. Pregnancy  If you  are premenopausal and you may become pregnant, ask your health care provider about preconception counseling.  If you may become pregnant, take 400 to 800 micrograms (mcg) of folic acid every day.  If you want to prevent pregnancy, talk to your health care provider about birth control (contraception). Osteoporosis and menopause  Osteoporosis is a disease in which the bones lose minerals and strength with aging. This can result in serious bone fractures. Your risk for osteoporosis can be identified using a bone density scan.  If you are 76 years of age or older, or if you are at risk for osteoporosis and fractures, ask your health care provider if you should be screened.  Ask your health care provider whether you should take a calcium or vitamin D supplement to lower your risk for osteoporosis.  Menopause may have certain physical symptoms and risks.  Hormone replacement therapy may reduce some of these symptoms and risks. Talk to your health care provider about whether hormone replacement therapy is right for you. Follow these instructions at home:  Schedule regular health, dental, and eye exams.  Stay current with your immunizations.  Do not use any tobacco products including cigarettes, chewing tobacco, or electronic cigarettes.  If you are pregnant, do not drink alcohol.  If you are breastfeeding, limit how much and how often you drink alcohol.  Limit alcohol intake to no more than 1 drink per day for nonpregnant women. One drink equals 12 ounces of beer, 5 ounces of wine, or 1 ounces of hard  liquor.  Do not use street drugs.  Do not share needles.  Ask your health care provider for help if you need support or information about quitting drugs.  Tell your health care provider if you often feel depressed.  Tell your health care provider if you have ever been abused or do not feel safe at home. This information is not intended to replace advice given to you by your health care provider. Make sure you discuss any questions you have with your health care provider. Document Released: 07/11/2010 Document Revised: 06/03/2015 Document Reviewed: 09/29/2014 Elsevier Interactive Patient Education  [employer].

## 2016-12-11 NOTE — Progress Notes (Signed)
   Subjective:    Patient ID: Lindsay Henderson, female    DOB: [birth year], 61 y.o.   MRN: [id number]  HPI The patient is a 61 YO female coming in for physical.  PMH, [facility], social history reviewed and updated.  Review of Systems  Constitutional: Negative.   HENT: Negative.   Eyes: Negative.   Respiratory: Negative for cough, chest tightness and shortness of breath.   Cardiovascular: Negative for chest pain, palpitations and leg swelling.  Gastrointestinal: Negative for abdominal distention, abdominal pain, constipation, diarrhea, nausea and vomiting.  Musculoskeletal: Positive for arthralgias.  Skin: Negative.   Neurological: Negative.   Psychiatric/Behavioral: Negative.       Objective:   Physical Exam  Constitutional: She is oriented to person, place, and time. She appears well-developed and well-nourished.  HENT:  Head: Normocephalic and atraumatic.  Eyes: EOM are normal.  Neck: Normal range of motion.  Cardiovascular: Normal rate and regular rhythm.  Pulmonary/Chest: Effort normal and breath sounds normal. No respiratory distress. She has no wheezes. She has no rales.  Abdominal: Soft. Bowel sounds are normal. She exhibits no distension. There is no tenderness. There is no rebound.  Musculoskeletal: She exhibits no edema.  Neurological: She is alert and oriented to person, place, and time. Coordination normal.  Skin: Skin is warm and dry.  Psychiatric: She has a normal mood and affect.   Vitals:   12/11/16 1527  BP: 136/82  Pulse: 85  Temp: 98.4 F (36.9 C)  TempSrc: Oral  SpO2: 98%  Weight: 151 lb (68.5 kg)  Height: 5\' 2"  (1.575 m)      Assessment & Plan:

## 2016-12-13 ENCOUNTER — Other Ambulatory Visit: Payer: Self-pay | Admitting: Endocrinology

## 2016-12-13 NOTE — Assessment & Plan Note (Signed)
Taking pravastatin 20 mg daily, recent lipid panel at goal <70 LDL.

## 2016-12-13 NOTE — Assessment & Plan Note (Signed)
Flu declined tetanus and pneumonia declined, colonoscopy and mammogram up to date. Declines need for hiv or hep c screening. Counseled on sun safety and need for routine exercise. Given screening recommendations.

## 2017-01-05 LAB — HM MAMMOGRAPHY

## 2017-01-08 ENCOUNTER — Encounter: Payer: Self-pay | Admitting: Internal Medicine

## 2017-01-08 NOTE — Progress Notes (Signed)
Abstracted and sent to scan  

## 2017-03-20 ENCOUNTER — Other Ambulatory Visit: Payer: Self-pay | Admitting: Endocrinology

## 2017-05-25 ENCOUNTER — Other Ambulatory Visit: Payer: Self-pay | Admitting: Endocrinology

## 2017-05-27 NOTE — Telephone Encounter (Signed)
Please refill x 1 Ov is due  

## 2017-07-02 ENCOUNTER — Other Ambulatory Visit: Payer: Self-pay | Admitting: Endocrinology

## 2017-07-03 ENCOUNTER — Other Ambulatory Visit: Payer: Self-pay | Admitting: Endocrinology

## 2017-07-03 ENCOUNTER — Ambulatory Visit: Payer: BLUE CROSS/BLUE SHIELD | Admitting: Endocrinology

## 2017-07-03 ENCOUNTER — Encounter: Payer: Self-pay | Admitting: Endocrinology

## 2017-07-03 VITALS — BP 128/72 | HR 98 | Ht 62.0 in | Wt 152.6 lb

## 2017-07-03 DIAGNOSIS — E119 Type 2 diabetes mellitus without complications: Secondary | ICD-10-CM

## 2017-07-03 LAB — POCT GLYCOSYLATED HEMOGLOBIN (HGB A1C): Hemoglobin A1C: 8.6 % — AB (ref 4.0–5.6)

## 2017-07-03 MED ORDER — GLIMEPIRIDE 2 MG PO TABS: 2.0000 mg | ORAL_TABLET | Freq: Every day | ORAL | 3 refills | Status: DC

## 2017-07-03 MED ORDER — EMPAGLIFLOZIN 10 MG PO TABS: 10.0000 mg | ORAL_TABLET | Freq: Every day | ORAL | 11 refills | Status: DC

## 2017-07-03 MED ORDER — FLUCONAZOLE 150 MG PO TABS: 150.0000 mg | ORAL_TABLET | Freq: Once | ORAL | 3 refills | Status: AC

## 2017-07-03 NOTE — Progress Notes (Signed)
Subjective:    Patient ID: Lindsay Henderson, female    DOB: [birth year], 62 y.o.   MRN: [id number]  HPI Pt returns for f/u of diabetes mellitus: DM type: 2 Dx'ed: 8413 Complications: retinopathy Therapy: 4 oral meds GDM: (1987 and 1992) DKA: never Severe hypoglycemia: never Pancreatitis: never Other: she took insulin only for GDM; she could not afford invokana; she did not tolerate glimepiride (hypoglycemia).   Interval history: no cbg record, but states cbg's are approx 200.  She tolerated jardiance 10 mg, but not 25 mg (yeast infection).  pt states she feels well in general.  She sometimes misses the medications Past Medical History:  Diagnosis Date  . Adenomatous colon polyp 11/2005  . Allergic rhinitis   . Allergy   . Anomalous atrioventricular excitation   . Anxiety    no per pt  . Asthma   . Diverticulosis of colon   . Gastritis   . GERD (gastroesophageal reflux disease)   . History of supraventricular tachycardia   . Hypercholesteremia   . Kidney stones   . Nephrolithiasis   . Renal cyst   . Somatic dysfunction   . Type II or unspecified type diabetes mellitus without mention of complication, not stated as uncontrolled   . Vitamin D deficiency     Past Surgical History:  Procedure Laterality Date  . CARDIAC ELECTROPHYSIOLOGY [place] AND ABLATION  2004  . COLONOSCOPY  2007  . TUBAL LIGATION      Social History   Socioeconomic History  . Marital status: Married    Spouse name: Not on file  . Number of children: 4  . Years of education: Not on file  . Highest education level: Not on file  Occupational History  . Occupation: [employer]  Social Needs  . Financial resource strain: Not on file  . Food insecurity:    Worry: Not on file    Inability: Not on file  . Transportation needs:    Medical: Not on file    Non-medical: Not on file  Tobacco Use  . Smoking status: Never Smoker  . Smokeless tobacco: Never Used  Substance and Sexual Activity   . Alcohol use: No  . Drug use: No  . Sexual activity: Not Currently  Lifestyle  . Physical activity:    Days per week: Not on file    Minutes per session: Not on file  . Stress: Not on file  Relationships  . Social connections:    Talks on phone: Not on file    Gets together: Not on file    Attends religious service: Not on file    Active member of club or organization: Not on file    Attends meetings of clubs or organizations: Not on file    Relationship status: Not on file  . Intimate partner violence:    Fear of current or ex partner: Not on file    Emotionally abused: Not on file    Physically abused: Not on file    Forced sexual activity: Not on file  Other Topics Concern  . Not on file  Social History Narrative  . Not on file    Current Outpatient Medications on File Prior to Visit  Medication Sig Dispense Refill  . albuterol (PROAIR HFA) 108 (90 Base) MCG/ACT inhaler Inhale 2 puffs into the lungs every 6 (six) hours as needed. 1 Inhaler 2  . beclomethasone (QVAR) 80 MCG/ACT inhaler Inhale 1 puff into the lungs 2 (two) times daily. 1  Inhaler 6  . clotrimazole-betamethasone (LOTRISONE) cream Apply 1 application topically 2 (two) times daily. 45 g 2  . esomeprazole (NEXIUM) 20 MG capsule Take 20 mg by mouth daily at 12 noon.    . fexofenadine ([name]) 180 MG tablet Take 180 mg by mouth daily.    . fluticasone (FLONASE) 50 MCG/ACT nasal spray Place 2 sprays into both nostrils daily.    . ONGLYZA 5 MG TABS tablet TAKE 1 TABLET BY MOUTH ONCE DAILY 30 tablet 11  . pravastatin (PRAVACHOL) 20 MG tablet Take 1 tablet (20 mg total) by mouth daily. 90 tablet 3   No current facility-administered medications on file prior to visit.     Allergies  Allergen Reactions  . Parlodel [Bromocriptine Mesylate] Nausea Only    nausea  . Peanut-Containing Drug Products Hives, Itching and Swelling  . Penicillins     REACTION: rash and sob  . Pioglitazone Diarrhea    tremor  . Sulfa  Antibiotics Hives  . Zithromax [Azithromycin] Nausea And Vomiting    Family History  Problem Relation Age of Onset  . Diabetes Father   . Hypertension Father   . Hyperlipidemia Father   . Breast cancer Mother   . Esophageal cancer Neg Hx   . Stomach cancer Neg Hx   . Colon cancer Neg Hx   . Rectal cancer Neg Hx     BP 128/72 (BP Location: Right Arm, Patient Position: Sitting, Cuff Size: Normal)   Pulse 98   Ht 5\' 2"  (1.575 m)   Wt 152 lb 9.6 oz (69.2 kg)   SpO2 93%   BMI 27.91 kg/m   Review of Systems She denies hypoglycemia    Objective:   Physical Exam VITAL SIGNS:  See vs page GENERAL: no distress Pulses: foot pulses are intact bilaterally.   MSK: no deformity of the feet or ankles.  CV: no edema of the legs or ankles Skin:  no ulcer on the feet or ankles.  normal color and temp on the feet and ankles Neuro: sensation is intact to touch on the feet and ankles.    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 Please come back for a follow-up appointment in 2 months check your blood sugar once a day.  vary the time of day when you check, between before the 3 meals, and at bedtime.  also check if you have symptoms of your blood sugar being too high or too low.  please keep a record of the readings and bring it to your next appointment here.  You can write it on any piece of paper.  please call us sooner if your blood sugar goes below 70, or if you have a lot of readings over 200.

## 2017-07-03 NOTE — Patient Instructions (Addendum)
I have sent 2 prescription to your pharmacy: to resume the jardiance, and to change the repaglinide to glimepiride. Please come back for a follow-up appointment in 2 months check your blood sugar once a day.  vary the time of day when you check, between before the 3 meals, and at bedtime.  also check if you have symptoms of your blood sugar being too high or too low.  please keep a record of the readings and bring it to your next appointment here.  You can write it on any piece of paper.  please call us sooner if your blood sugar goes below 70, or if you have a lot of readings over 200.

## 2017-07-04 ENCOUNTER — Ambulatory Visit: Payer: BLUE CROSS/BLUE SHIELD | Admitting: Internal Medicine

## 2017-07-24 ENCOUNTER — Encounter (INDEPENDENT_AMBULATORY_CARE_PROVIDER_SITE_OTHER): Payer: Self-pay | Admitting: Ophthalmology

## 2017-07-27 ENCOUNTER — Encounter (INDEPENDENT_AMBULATORY_CARE_PROVIDER_SITE_OTHER): Payer: BLUE CROSS/BLUE SHIELD | Admitting: Ophthalmology

## 2017-07-27 DIAGNOSIS — H35033 Hypertensive retinopathy, bilateral: Secondary | ICD-10-CM | POA: Diagnosis not present

## 2017-07-27 DIAGNOSIS — H43813 Vitreous degeneration, bilateral: Secondary | ICD-10-CM

## 2017-07-27 DIAGNOSIS — E113311 Type 2 diabetes mellitus with moderate nonproliferative diabetic retinopathy with macular edema, right eye: Secondary | ICD-10-CM | POA: Diagnosis not present

## 2017-07-27 DIAGNOSIS — I10 Essential (primary) hypertension: Secondary | ICD-10-CM | POA: Diagnosis not present

## 2017-07-27 DIAGNOSIS — E11311 Type 2 diabetes mellitus with unspecified diabetic retinopathy with macular edema: Secondary | ICD-10-CM

## 2017-07-27 DIAGNOSIS — H2513 Age-related nuclear cataract, bilateral: Secondary | ICD-10-CM

## 2017-07-27 DIAGNOSIS — E113512 Type 2 diabetes mellitus with proliferative diabetic retinopathy with macular edema, left eye: Secondary | ICD-10-CM

## 2017-08-16 ENCOUNTER — Ambulatory Visit: Payer: BLUE CROSS/BLUE SHIELD | Admitting: Internal Medicine

## 2017-08-16 ENCOUNTER — Encounter: Payer: Self-pay | Admitting: Internal Medicine

## 2017-08-16 ENCOUNTER — Other Ambulatory Visit (INDEPENDENT_AMBULATORY_CARE_PROVIDER_SITE_OTHER): Payer: BLUE CROSS/BLUE SHIELD

## 2017-08-16 VITALS — BP 136/70 | HR 102 | Temp 98.2°F | Ht 62.0 in | Wt 150.0 lb

## 2017-08-16 DIAGNOSIS — H579 Unspecified disorder of eye and adnexa: Secondary | ICD-10-CM | POA: Diagnosis not present

## 2017-08-16 LAB — CBC
HEMATOCRIT: 41.3 % (ref 36.0–46.0)
Hemoglobin: 13.8 g/dL (ref 12.0–15.0)
MCHC: 33.5 g/dL (ref 30.0–36.0)
MCV: 88.3 fl (ref 78.0–100.0)
PLATELETS: 296 10*3/uL (ref 150.0–400.0)
RBC: 4.68 Mil/uL (ref 3.87–5.11)
RDW: 13.9 % (ref 11.5–15.5)
WBC: 9.7 10*3/uL (ref 4.0–10.5)

## 2017-08-16 NOTE — Progress Notes (Signed)
   Subjective:    Patient ID: Lindsay Henderson, female    DOB: 1955-05-07, 62 y.o.   MRN: [id number]  HPI The patient is a 62 YO female coming in for eye concerns including a new retina issue with blood vessel growth. There were some findings which were concerning to the retina specialist for sickle cell trait. The patient is concerned about this and wants to know what this could mean. She was supposed to get labs done from them and where she was sent was not able to draw them.   Fax labs [phone] [facility]  Review of Systems  Constitutional: Negative.   HENT: Negative.   Eyes: Positive for visual disturbance.  Respiratory: Negative for cough, chest tightness and shortness of breath.   Cardiovascular: Negative for chest pain, palpitations and leg swelling.  Gastrointestinal: Negative for abdominal distention, abdominal pain, constipation, diarrhea, nausea and vomiting.  Musculoskeletal: Negative.   Skin: Negative.   Neurological: Negative.   Psychiatric/Behavioral: Negative.       Objective:   Physical Exam  Constitutional: She is oriented to person, place, and time. She appears well-developed and well-nourished.  HENT:  Head: Normocephalic and atraumatic.  Eyes: EOM are normal.  Neck: Normal range of motion.  Cardiovascular: Normal rate and regular rhythm.  Pulmonary/Chest: Effort normal and breath sounds normal. No respiratory distress. She has no wheezes. She has no rales.  Abdominal: Soft. Bowel sounds are normal. She exhibits no distension. There is no tenderness. There is no rebound.  Musculoskeletal: She exhibits no edema.  Neurological: She is alert and oriented to person, place, and time. Coordination normal.  Skin: Skin is warm and dry.  Psychiatric: She has a normal mood and affect.   Vitals:   08/16/17 1539  BP: 136/70  Pulse: (!) 102  Temp: 98.2 F (36.8 C)  TempSrc: Oral  SpO2: 96%  Weight: 150 lb (68 kg)  Height: 5\' 2"  (1.575 m)      Assessment  & Plan:

## 2017-08-16 NOTE — Patient Instructions (Signed)
We will check the labs today. 

## 2017-08-17 ENCOUNTER — Encounter: Payer: Self-pay | Admitting: Internal Medicine

## 2017-08-17 DIAGNOSIS — H579 Unspecified disorder of eye and adnexa: Secondary | ICD-10-CM | POA: Insufficient documentation

## 2017-08-17 NOTE — Assessment & Plan Note (Signed)
We have ordered CBC and hg electrophoresis as requested. We discussed in detail the outcome if sickle cell trait is found. I let her know I cannot speak for any eye findings or retina findings but there is no impact on her health and wellbeing. She is past childbearing age and we talked about how this is the most common risk is being with a partner that is also sick cell trait can have risk of child with sickle cell disease. None of her children have sickle cell disease.

## 2017-08-20 LAB — HEMOGLOBINOPATHY EVALUATION
HEMOGLOBIN F QUANTITATION: 0 % (ref 0.0–2.0)
HGB A: 97.9 % (ref 96.4–98.8)
HGB C: 0 %
HGB S: 0 %
HGB VARIANT: 0 %
Hemoglobin A2 Quantitation: 2.1 % (ref 1.8–3.2)

## 2017-08-23 DIAGNOSIS — E113311 Type 2 diabetes mellitus with moderate nonproliferative diabetic retinopathy with macular edema, right eye: Secondary | ICD-10-CM

## 2017-08-23 DIAGNOSIS — E113512 Type 2 diabetes mellitus with proliferative diabetic retinopathy with macular edema, left eye: Secondary | ICD-10-CM

## 2017-08-23 DIAGNOSIS — E11311 Type 2 diabetes mellitus with unspecified diabetic retinopathy with macular edema: Secondary | ICD-10-CM

## 2017-08-23 DIAGNOSIS — H43813 Vitreous degeneration, bilateral: Secondary | ICD-10-CM

## 2017-08-23 DIAGNOSIS — H2513 Age-related nuclear cataract, bilateral: Secondary | ICD-10-CM

## 2017-08-24 ENCOUNTER — Encounter (INDEPENDENT_AMBULATORY_CARE_PROVIDER_SITE_OTHER): Payer: BLUE CROSS/BLUE SHIELD | Admitting: Ophthalmology

## 2017-09-03 ENCOUNTER — Ambulatory Visit: Payer: BLUE CROSS/BLUE SHIELD | Admitting: Endocrinology

## 2017-09-13 ENCOUNTER — Ambulatory Visit: Payer: BLUE CROSS/BLUE SHIELD | Admitting: Endocrinology

## 2017-09-13 ENCOUNTER — Encounter: Payer: Self-pay | Admitting: Endocrinology

## 2017-09-13 VITALS — BP 120/66 | HR 90 | Ht 62.0 in | Wt 154.2 lb

## 2017-09-13 DIAGNOSIS — E119 Type 2 diabetes mellitus without complications: Secondary | ICD-10-CM | POA: Diagnosis not present

## 2017-09-13 LAB — POCT GLYCOSYLATED HEMOGLOBIN (HGB A1C): Hemoglobin A1C: 7.6 % — AB (ref 4.0–5.6)

## 2017-09-13 NOTE — Progress Notes (Signed)
Subjective:    Patient ID: Lindsay Henderson, female    DOB: 11-Aug-1955, 62 y.o.   MRN: [id number]  HPI Pt returns for f/u of diabetes mellitus: DM type: 2 Dx'ed: 9563 Complications: DR.   Therapy: 4 oral meds GDM: (1987 and 1992).   DKA: never Severe hypoglycemia: never Pancreatitis: never Other: she took insulin only for GDM; she could not afford invokana; she tolerated jardiance 10 mg, but not 25 mg (yeast infection); repaglinide was changed to glimepiride, as she could not take TID; she did not tolerate bromocriptine (nausea); pioglitazone is contraindicated by macular edema   Interval history: no cbg record, but states cbg's are in the mid-100's.  pt states she feels well in general.  She never misses the medications, except she take jardiance intermittently (due to mild dysuria).  She seldom has hypoglycemia, and these episodes are mild. Past Medical History:  Diagnosis Date  . Adenomatous colon polyp 11/2005  . Allergic rhinitis   . Allergy   . Anomalous atrioventricular excitation   . Anxiety    no per pt  . Asthma   . Diverticulosis of colon   . Gastritis   . GERD (gastroesophageal reflux disease)   . History of supraventricular tachycardia   . Hypercholesteremia   . Kidney stones   . Nephrolithiasis   . Renal cyst   . Somatic dysfunction   . Type II or unspecified type diabetes mellitus without mention of complication, not stated as uncontrolled   . Vitamin D deficiency     Past Surgical History:  Procedure Laterality Date  . CARDIAC ELECTROPHYSIOLOGY [place] AND ABLATION  2004  . COLONOSCOPY  2007  . TUBAL LIGATION      Social History   Socioeconomic History  . Marital status: Married    Spouse name: Not on file  . Number of children: 4  . Years of education: Not on file  . Highest education level: Not on file  Occupational History  . Occupation: [employer]  Social Needs  . Financial resource strain: Not on file  . Food insecurity:   Worry: Not on file    Inability: Not on file  . Transportation needs:    Medical: Not on file    Non-medical: Not on file  Tobacco Use  . Smoking status: Never Smoker  . Smokeless tobacco: Never Used  Substance and Sexual Activity  . Alcohol use: No  . Drug use: No  . Sexual activity: Not Currently  Lifestyle  . Physical activity:    Days per week: Not on file    Minutes per session: Not on file  . Stress: Not on file  Relationships  . Social connections:    Talks on phone: Not on file    Gets together: Not on file    Attends religious service: Not on file    Active member of club or organization: Not on file    Attends meetings of clubs or organizations: Not on file    Relationship status: Not on file  . Intimate partner violence:    Fear of current or ex partner: Not on file    Emotionally abused: Not on file    Physically abused: Not on file    Forced sexual activity: Not on file  Other Topics Concern  . Not on file  Social History Narrative  . Not on file    Current Outpatient Medications on File Prior to Visit  Medication Sig Dispense Refill  . albuterol (PROAIR HFA)  108 (90 Base) MCG/ACT inhaler Inhale 2 puffs into the lungs every 6 (six) hours as needed. 1 Inhaler 2  . beclomethasone (QVAR) 80 MCG/ACT inhaler Inhale 1 puff into the lungs 2 (two) times daily. 1 Inhaler 6  . clotrimazole-betamethasone (LOTRISONE) cream Apply 1 application topically 2 (two) times daily. 45 g 2  . empagliflozin (JARDIANCE) 10 MG TABS tablet Take 10 mg by mouth daily. 30 tablet 11  . esomeprazole (NEXIUM) 20 MG capsule Take 20 mg by mouth daily at 12 noon.    . fexofenadine ([name]) 180 MG tablet Take 180 mg by mouth daily.    . fluticasone (FLONASE) 50 MCG/ACT nasal spray Place 2 sprays into both nostrils daily.    Marland Kitchen glimepiride (AMARYL) 2 MG tablet Take 1 tablet (2 mg total) by mouth daily before breakfast. 90 tablet 3  . metFORMIN (GLUCOPHAGE) 500 MG tablet TAKE 2 TABLETS BY MOUTH  TWICE DAILY WITH MEALS 120 tablet 2  . ONGLYZA 5 MG TABS tablet TAKE 1 TABLET BY MOUTH ONCE DAILY 30 tablet 11  . pravastatin (PRAVACHOL) 20 MG tablet Take 1 tablet (20 mg total) by mouth daily. 90 tablet 3   No current facility-administered medications on file prior to visit.     Allergies  Allergen Reactions  . Parlodel [Bromocriptine Mesylate] Nausea Only    nausea  . Peanut-Containing Drug Products Hives, Itching and Swelling  . Penicillins     REACTION: rash and sob  . Pioglitazone Diarrhea    tremor  . Sulfa Antibiotics Hives  . Zithromax [Azithromycin] Nausea And Vomiting    Family History  Problem Relation Age of Onset  . Diabetes Father   . Hypertension Father   . Hyperlipidemia Father   . Breast cancer Mother   . Esophageal cancer Neg Hx   . Stomach cancer Neg Hx   . Colon cancer Neg Hx   . Rectal cancer Neg Hx     BP 120/66 (BP Location: Left Arm, Patient Position: Sitting, Cuff Size: Normal)   Pulse 90   Ht 5\' 2"  (1.575 m)   Wt 154 lb 3.2 oz (69.9 kg)   SpO2 97%   BMI 28.20 kg/m    Review of Systems No weight change    Objective:   Physical Exam VITAL SIGNS:  See vs page GENERAL: no distress Pulses: dorsalis pedis intact bilat.   MSK: no deformity of the feet CV: no leg edema Skin:  no ulcer on the feet.  normal color and temp on the feet. Neuro: sensation is intact to touch on the feet.    Lab Results  Component Value Date   CREATININE 0.69 11/01/2016   BUN 10 11/01/2016   NA 139 11/01/2016   K 4.0 11/01/2016   CL 101 11/01/2016   CO2 29 11/01/2016     Lab Results  Component Value Date   HGBA1C 7.6 (A) 09/13/2017       Assessment & Plan:  Type 2 DM: she needs increased rx Dysuria, intermittent, new, poss due to jardiance.  We discussed possibility of changing Onglyza to GLP med, and stopping jardiance.  She declines    Patient Instructions  Please continue the same medications, and try taking jardiance every day.  You can  minimize urine symptoms by drinking plenty of fluid.  Please come back for a follow-up appointment in 2 months check your blood sugar once a day.  vary the time of day when you check, between before the 3 meals, and at bedtime.  also check if you have symptoms of your blood sugar being too high or too low.  please keep a record of the readings and bring it to your next appointment here.  You can write it on any piece of paper.  please call us sooner if your blood sugar goes below 70, or if you have a lot of readings over 200.

## 2017-09-13 NOTE — Patient Instructions (Addendum)
Please continue the same medications, and try taking jardiance every day.  You can minimize urine symptoms by drinking plenty of fluid.  Please come back for a follow-up appointment in 2 months check your blood sugar once a day.  vary the time of day when you check, between before the 3 meals, and at bedtime.  also check if you have symptoms of your blood sugar being too high or too low.  please keep a record of the readings and bring it to your next appointment here.  You can write it on any piece of paper.  please call us sooner if your blood sugar goes below 70, or if you have a lot of readings over 200.

## 2017-09-21 ENCOUNTER — Encounter (INDEPENDENT_AMBULATORY_CARE_PROVIDER_SITE_OTHER): Payer: BLUE CROSS/BLUE SHIELD | Admitting: Ophthalmology

## 2017-09-21 DIAGNOSIS — I10 Essential (primary) hypertension: Secondary | ICD-10-CM | POA: Diagnosis not present

## 2017-09-21 DIAGNOSIS — E11311 Type 2 diabetes mellitus with unspecified diabetic retinopathy with macular edema: Secondary | ICD-10-CM

## 2017-09-21 DIAGNOSIS — H2513 Age-related nuclear cataract, bilateral: Secondary | ICD-10-CM

## 2017-09-21 DIAGNOSIS — H35033 Hypertensive retinopathy, bilateral: Secondary | ICD-10-CM

## 2017-09-21 DIAGNOSIS — H43813 Vitreous degeneration, bilateral: Secondary | ICD-10-CM

## 2017-09-21 DIAGNOSIS — E113512 Type 2 diabetes mellitus with proliferative diabetic retinopathy with macular edema, left eye: Secondary | ICD-10-CM | POA: Diagnosis not present

## 2017-09-21 DIAGNOSIS — E113311 Type 2 diabetes mellitus with moderate nonproliferative diabetic retinopathy with macular edema, right eye: Secondary | ICD-10-CM | POA: Diagnosis not present

## 2017-10-12 ENCOUNTER — Encounter (INDEPENDENT_AMBULATORY_CARE_PROVIDER_SITE_OTHER): Payer: BLUE CROSS/BLUE SHIELD | Admitting: Ophthalmology

## 2017-10-20 ENCOUNTER — Other Ambulatory Visit: Payer: Self-pay | Admitting: Endocrinology

## 2017-10-26 ENCOUNTER — Encounter (INDEPENDENT_AMBULATORY_CARE_PROVIDER_SITE_OTHER): Payer: BLUE CROSS/BLUE SHIELD | Admitting: Ophthalmology

## 2017-10-26 DIAGNOSIS — E113512 Type 2 diabetes mellitus with proliferative diabetic retinopathy with macular edema, left eye: Secondary | ICD-10-CM

## 2017-10-26 DIAGNOSIS — E11311 Type 2 diabetes mellitus with unspecified diabetic retinopathy with macular edema: Secondary | ICD-10-CM | POA: Diagnosis not present

## 2017-11-02 ENCOUNTER — Encounter (INDEPENDENT_AMBULATORY_CARE_PROVIDER_SITE_OTHER): Payer: BLUE CROSS/BLUE SHIELD | Admitting: Ophthalmology

## 2017-11-08 ENCOUNTER — Encounter (INDEPENDENT_AMBULATORY_CARE_PROVIDER_SITE_OTHER): Payer: BLUE CROSS/BLUE SHIELD | Admitting: Ophthalmology

## 2017-11-08 DIAGNOSIS — E11311 Type 2 diabetes mellitus with unspecified diabetic retinopathy with macular edema: Secondary | ICD-10-CM | POA: Diagnosis not present

## 2017-11-08 DIAGNOSIS — H2513 Age-related nuclear cataract, bilateral: Secondary | ICD-10-CM

## 2017-11-08 DIAGNOSIS — E113311 Type 2 diabetes mellitus with moderate nonproliferative diabetic retinopathy with macular edema, right eye: Secondary | ICD-10-CM | POA: Diagnosis not present

## 2017-11-08 DIAGNOSIS — H43813 Vitreous degeneration, bilateral: Secondary | ICD-10-CM

## 2017-11-08 DIAGNOSIS — E113512 Type 2 diabetes mellitus with proliferative diabetic retinopathy with macular edema, left eye: Secondary | ICD-10-CM

## 2017-11-09 ENCOUNTER — Encounter (INDEPENDENT_AMBULATORY_CARE_PROVIDER_SITE_OTHER): Payer: BLUE CROSS/BLUE SHIELD | Admitting: Ophthalmology

## 2017-11-13 ENCOUNTER — Ambulatory Visit: Payer: BLUE CROSS/BLUE SHIELD | Admitting: Endocrinology

## 2017-11-20 ENCOUNTER — Ambulatory Visit: Payer: BLUE CROSS/BLUE SHIELD | Admitting: Endocrinology

## 2017-12-04 ENCOUNTER — Other Ambulatory Visit: Payer: Self-pay | Admitting: Internal Medicine

## 2017-12-04 DIAGNOSIS — E119 Type 2 diabetes mellitus without complications: Secondary | ICD-10-CM

## 2017-12-04 DIAGNOSIS — E785 Hyperlipidemia, unspecified: Principal | ICD-10-CM

## 2017-12-04 DIAGNOSIS — E1169 Type 2 diabetes mellitus with other specified complication: Secondary | ICD-10-CM

## 2017-12-04 DIAGNOSIS — H579 Unspecified disorder of eye and adnexa: Secondary | ICD-10-CM

## 2017-12-11 ENCOUNTER — Other Ambulatory Visit (INDEPENDENT_AMBULATORY_CARE_PROVIDER_SITE_OTHER): Payer: BLUE CROSS/BLUE SHIELD

## 2017-12-11 DIAGNOSIS — E1169 Type 2 diabetes mellitus with other specified complication: Secondary | ICD-10-CM | POA: Diagnosis not present

## 2017-12-11 DIAGNOSIS — E785 Hyperlipidemia, unspecified: Secondary | ICD-10-CM | POA: Diagnosis not present

## 2017-12-11 DIAGNOSIS — E119 Type 2 diabetes mellitus without complications: Secondary | ICD-10-CM | POA: Diagnosis not present

## 2017-12-11 LAB — HEMOGLOBIN A1C: HEMOGLOBIN A1C: 7.5 % — AB (ref 4.6–6.5)

## 2017-12-11 LAB — COMPREHENSIVE METABOLIC PANEL
ALK PHOS: 86 U/L (ref 39–117)
ALT: 26 U/L (ref 0–35)
AST: 20 U/L (ref 0–37)
Albumin: 4.5 g/dL (ref 3.5–5.2)
BILIRUBIN TOTAL: 0.4 mg/dL (ref 0.2–1.2)
BUN: 10 mg/dL (ref 6–23)
CO2: 28 mEq/L (ref 19–32)
Calcium: 9.3 mg/dL (ref 8.4–10.5)
Chloride: 103 mEq/L (ref 96–112)
Creatinine, Ser: 0.7 mg/dL (ref 0.40–1.20)
GFR: 108.59 mL/min (ref >60.00)
Glucose, Bld: 141 mg/dL — ABNORMAL HIGH (ref 70–99)
POTASSIUM: 3.8 meq/L (ref 3.5–5.1)
Sodium: 140 mEq/L (ref 135–145)
Total Protein: 7.3 g/dL (ref 6.0–8.3)

## 2017-12-11 LAB — CBC
HCT: 41.6 % (ref 36.0–46.0)
Hemoglobin: 13.8 g/dL (ref 12.0–15.0)
MCHC: 33.2 g/dL (ref 30.0–36.0)
MCV: 89 fl (ref 78.0–100.0)
Platelets: 300 10*3/uL (ref 150.0–400.0)
RBC: 4.67 Mil/uL (ref 3.87–5.11)
RDW: 13.7 % (ref 11.5–15.5)
WBC: 7.9 10*3/uL (ref 4.0–10.5)

## 2017-12-11 LAB — LIPID PANEL
CHOLESTEROL: 199 mg/dL (ref 0–200)
HDL: 43.4 mg/dL (ref >39.00)
LDL Cholesterol: 133 mg/dL — ABNORMAL HIGH (ref 0–99)
NonHDL: 155.71
Total CHOL/HDL Ratio: 5
Triglycerides: 116 mg/dL (ref 0.0–149.0)
VLDL: 23.2 mg/dL (ref 0.0–40.0)

## 2017-12-12 ENCOUNTER — Encounter (INDEPENDENT_AMBULATORY_CARE_PROVIDER_SITE_OTHER): Payer: BLUE CROSS/BLUE SHIELD | Admitting: Ophthalmology

## 2017-12-12 DIAGNOSIS — E113512 Type 2 diabetes mellitus with proliferative diabetic retinopathy with macular edema, left eye: Secondary | ICD-10-CM

## 2017-12-12 DIAGNOSIS — E11311 Type 2 diabetes mellitus with unspecified diabetic retinopathy with macular edema: Secondary | ICD-10-CM | POA: Diagnosis not present

## 2017-12-12 DIAGNOSIS — E113391 Type 2 diabetes mellitus with moderate nonproliferative diabetic retinopathy without macular edema, right eye: Secondary | ICD-10-CM | POA: Diagnosis not present

## 2017-12-12 DIAGNOSIS — H43813 Vitreous degeneration, bilateral: Secondary | ICD-10-CM

## 2017-12-12 DIAGNOSIS — H2513 Age-related nuclear cataract, bilateral: Secondary | ICD-10-CM

## 2017-12-14 ENCOUNTER — Encounter: Payer: BLUE CROSS/BLUE SHIELD | Admitting: Internal Medicine

## 2017-12-17 ENCOUNTER — Ambulatory Visit (INDEPENDENT_AMBULATORY_CARE_PROVIDER_SITE_OTHER): Payer: BLUE CROSS/BLUE SHIELD | Admitting: Internal Medicine

## 2017-12-17 ENCOUNTER — Encounter: Payer: Self-pay | Admitting: Internal Medicine

## 2017-12-17 ENCOUNTER — Other Ambulatory Visit (INDEPENDENT_AMBULATORY_CARE_PROVIDER_SITE_OTHER): Payer: BLUE CROSS/BLUE SHIELD

## 2017-12-17 VITALS — BP 110/70 | HR 89 | Temp 98.1°F | Ht 62.0 in | Wt 146.0 lb

## 2017-12-17 DIAGNOSIS — E1139 Type 2 diabetes mellitus with other diabetic ophthalmic complication: Secondary | ICD-10-CM | POA: Diagnosis not present

## 2017-12-17 DIAGNOSIS — Z0000 Encounter for general adult medical examination without abnormal findings: Secondary | ICD-10-CM

## 2017-12-17 DIAGNOSIS — E1169 Type 2 diabetes mellitus with other specified complication: Secondary | ICD-10-CM

## 2017-12-17 DIAGNOSIS — E785 Hyperlipidemia, unspecified: Secondary | ICD-10-CM

## 2017-12-17 DIAGNOSIS — E559 Vitamin D deficiency, unspecified: Secondary | ICD-10-CM

## 2017-12-17 DIAGNOSIS — J4520 Mild intermittent asthma, uncomplicated: Secondary | ICD-10-CM | POA: Diagnosis not present

## 2017-12-17 LAB — MICROALBUMIN / CREATININE URINE RATIO
Creatinine,U: 165.5 mg/dL
Microalb Creat Ratio: 1.3 mg/g (ref 0.0–30.0)
Microalb, Ur: 2.2 mg/dL — ABNORMAL HIGH (ref 0.0–1.9)

## 2017-12-17 LAB — VITAMIN D 25 HYDROXY (VIT D DEFICIENCY, FRACTURES): VITD: 24.6 ng/mL — ABNORMAL LOW (ref 30.00–100.00)

## 2017-12-17 MED ORDER — FLUCONAZOLE 150 MG PO TABS: 150.0000 mg | ORAL_TABLET | ORAL | 3 refills | Status: DC

## 2017-12-17 MED ORDER — ROSUVASTATIN CALCIUM 5 MG PO TABS: 5.0000 mg | ORAL_TABLET | ORAL | 3 refills | Status: DC

## 2017-12-17 MED ORDER — ALBUTEROL SULFATE HFA 108 (90 BASE) MCG/ACT IN AERS: 2.0000 | INHALATION_SPRAY | Freq: Four times a day (QID) | RESPIRATORY_TRACT | 2 refills | Status: DC | PRN

## 2017-12-17 NOTE — Assessment & Plan Note (Signed)
Using albuterol only and doing well. No flare today.

## 2017-12-17 NOTE — Assessment & Plan Note (Signed)
Was not on statin and rx for crestor every other day to see if she can tolerate this. Has tried and failed pravastatin daily. Taking amaryl, jardiance, onglyza, metformin. Is not on ACE-I/ARB so checking microalbumin to creatinine urine ratio. Adjust as needed. Complicated by hyperlipidemia.

## 2017-12-17 NOTE — Assessment & Plan Note (Signed)
Checking vitamin D level and adjust as needed.  

## 2017-12-17 NOTE — Assessment & Plan Note (Signed)
Rx for crestor every other day low day to see if she can tolerate. If so we can gradually increase dosing.

## 2017-12-17 NOTE — Assessment & Plan Note (Signed)
Flu shot declines. Pneumonia declines. Shingrix declines. Tetanus declines. Colonoscopy up to date. Mammogram up to date, pap smear scheduled with gyn. Counseled about sun safety and mole surveillance. Counseled about the dangers of distracted driving. Given 10 year screening recommendations.

## 2017-12-17 NOTE — Progress Notes (Signed)
   Subjective:    Patient ID: Lindsay Henderson, female    DOB: October 02, 1955, 62 y.o.   MRN: [id number]  HPI The patient is a 62 YO female coming in for physical.   PMH, [place], social history reviewed and updated.   Review of Systems  Constitutional: Negative.   HENT: Negative.   Eyes: Negative.   Respiratory: Negative for cough, chest tightness and shortness of breath.   Cardiovascular: Negative for chest pain, palpitations and leg swelling.  Gastrointestinal: Negative for abdominal distention, abdominal pain, constipation, diarrhea, nausea and vomiting.  Musculoskeletal: Negative.   Skin: Negative.   Neurological: Negative.   Psychiatric/Behavioral: Negative.       Objective:   Physical Exam  Constitutional: She is oriented to person, place, and time. She appears well-developed and well-nourished.  HENT:  Head: Normocephalic and atraumatic.  Eyes: EOM are normal.  Neck: Normal range of motion.  Cardiovascular: Normal rate and regular rhythm.  Pulmonary/Chest: Effort normal and breath sounds normal. No respiratory distress. She has no wheezes. She has no rales.  Abdominal: Soft. Bowel sounds are normal. She exhibits no distension. There is no tenderness. There is no rebound.  Musculoskeletal: She exhibits no edema.  Neurological: She is alert and oriented to person, place, and time. Coordination normal.  Skin: Skin is warm and dry.  Psychiatric: She has a normal mood and affect.   Vitals:   12/17/17 0822  BP: 110/70  Pulse: 89  Temp: 98.1 F (36.7 C)  TempSrc: Oral  SpO2: 99%  Weight: 146 lb (66.2 kg)  Height: 5\' 2"  (1.575 m)   EKG: Rate 80, axis normal, interval normal, sinus, no st or t wave changes, low voltage, unchanged from prior 2013    Assessment & Plan:

## 2017-12-17 NOTE — Patient Instructions (Signed)
We have done the EKG today and this is normal and not changed from before.   Health Maintenance, Female Adopting a healthy lifestyle and getting preventive care can go a long way to promote health and wellness. Talk with your health care provider about what schedule of regular examinations is right for you. This is a good chance for you to check in with your provider about disease prevention and staying healthy. In between checkups, there are plenty of things you can do on your own. Experts have done a lot of research about which lifestyle changes and preventive measures are most likely to keep you healthy. Ask your health care provider for more information. Weight and diet Eat a healthy diet  Be sure to include plenty of vegetables, fruits, low-fat dairy products, and lean protein.  Do not eat a lot of foods high in solid fats, added sugars, or salt.  Get regular exercise. This is one of the most important things you can do for your health. ? Most adults should exercise for at least 150 minutes each week. The exercise should increase your heart rate and make you sweat (moderate-intensity exercise). ? Most adults should also do strengthening exercises at least twice a week. This is in addition to the moderate-intensity exercise.  Maintain a healthy weight  Body mass index (BMI) is a measurement that can be used to identify possible weight problems. It estimates body fat based on height and weight. Your health care provider can help determine your BMI and help you achieve or maintain a healthy weight.  For females 48 years of age and older: ? A BMI below 18.5 is considered underweight. ? A BMI of 18.5 to 24.9 is normal. ? A BMI of 25 to 29.9 is considered overweight. ? A BMI of 30 and above is considered obese.  Watch levels of cholesterol and blood lipids  You should start having your blood tested for lipids and cholesterol at 63 years of age, then have this test every 5 years.  You may  need to have your cholesterol levels checked more often if: ? Your lipid or cholesterol levels are high. ? You are older than 63 years of age. ? You are at high risk for heart disease.  Cancer screening Lung Cancer  Lung cancer screening is recommended for adults 28-71 years old who are at high risk for lung cancer because of a history of smoking.  A yearly low-dose CT scan of the lungs is recommended for people who: ? Currently smoke. ? Have quit within the past 15 years. ? Have at least a 30-pack-year history of smoking. A pack year is smoking an average of one pack of cigarettes a day for 1 year.  Yearly screening should continue until it has been 15 years since you quit.  Yearly screening should stop if you develop a health problem that would prevent you from having lung cancer treatment.  Breast Cancer  Practice breast self-awareness. This means understanding how your breasts normally appear and feel.  It also means doing regular breast self-exams. Let your health care provider know about any changes, no matter how small.  If you are in your 20s or 30s, you should have a clinical breast exam (CBE) by a health care provider every 1-3 years as part of a regular health exam.  If you are 44 or older, have a CBE every year. Also consider having a breast X-ray (mammogram) every year.  If you have a family history of breast  cancer, talk to your health care provider about genetic screening.  If you are at high risk for breast cancer, talk to your health care provider about having an MRI and a mammogram every year.  Breast cancer gene (BRCA) assessment is recommended for women who have family members with BRCA-related cancers. BRCA-related cancers include: ? Breast. ? Ovarian. ? Tubal. ? Peritoneal cancers.  Results of the assessment will determine the need for genetic counseling and BRCA1 and BRCA2 testing.  Cervical Cancer Your health care provider may recommend that you be  screened regularly for cancer of the pelvic organs (ovaries, uterus, and vagina). This screening involves a pelvic examination, including checking for microscopic changes to the surface of your cervix (Pap test). You may be encouraged to have this screening done every 3 years, beginning at age 35.  For women ages 30-65, health care providers may recommend pelvic exams and Pap testing every 3 years, or they may recommend the Pap and pelvic exam, combined with testing for human papilloma virus (HPV), every 5 years. Some types of HPV increase your risk of cervical cancer. Testing for HPV may also be done on women of any age with unclear Pap test results.  Other health care providers may not recommend any screening for nonpregnant women who are considered low risk for pelvic cancer and who do not have symptoms. Ask your health care provider if a screening pelvic exam is right for you.  If you have had past treatment for cervical cancer or a condition that could lead to cancer, you need Pap tests and screening for cancer for at least 20 years after your treatment. If Pap tests have been discontinued, your risk factors (such as having a new sexual partner) need to be reassessed to determine if screening should resume. Some women have medical problems that increase the chance of getting cervical cancer. In these cases, your health care provider may recommend more frequent screening and Pap tests.  Colorectal Cancer  This type of cancer can be detected and often prevented.  Routine colorectal cancer screening usually begins at 63 years of age and continues through 63 years of age.  Your health care provider may recommend screening at an earlier age if you have risk factors for colon cancer.  Your health care provider may also recommend using home test kits to check for hidden blood in the stool.  A small camera at the end of a tube can be used to examine your colon directly (sigmoidoscopy or colonoscopy).  This is done to check for the earliest forms of colorectal cancer.  Routine screening usually begins at age 45.  Direct examination of the colon should be repeated every 5-10 years through 63 years of age. However, you may need to be screened more often if early forms of precancerous polyps or small growths are found.  Skin Cancer  Check your skin from head to toe regularly.  Tell your health care provider about any new moles or changes in moles, especially if there is a change in a mole's shape or color.  Also tell your health care provider if you have a mole that is larger than the size of a pencil eraser.  Always use sunscreen. Apply sunscreen liberally and repeatedly throughout the day.  Protect yourself by wearing long sleeves, pants, a wide-brimmed hat, and sunglasses whenever you are outside.  Heart disease, diabetes, and high blood pressure  High blood pressure causes heart disease and increases the risk of stroke. High blood pressure  is more likely to develop in: ? People who have blood pressure in the high end of the normal range (130-139/85-89 mm Hg). ? People who are overweight or obese. ? People who are African American.  If you are 71-68 years of age, have your blood pressure checked every 3-5 years. If you are 27 years of age or older, have your blood pressure checked every year. You should have your blood pressure measured twice-once when you are at a hospital or clinic, and once when you are not at a hospital or clinic. Record the average of the two measurements. To check your blood pressure when you are not at a hospital or clinic, you can use: ? An automated blood pressure machine at a pharmacy. ? A home blood pressure monitor.  If you are between 3 years and 42 years old, ask your health care provider if you should take aspirin to prevent strokes.  Have regular diabetes screenings. This involves taking a blood sample to check your fasting blood sugar level. ? If  you are at a normal weight and have a low risk for diabetes, have this test once every three years after 63 years of age. ? If you are overweight and have a high risk for diabetes, consider being tested at a younger age or more often. Preventing infection Hepatitis B  If you have a higher risk for hepatitis B, you should be screened for this virus. You are considered at high risk for hepatitis B if: ? You were born in a country where hepatitis B is common. Ask your health care provider which countries are considered high risk. ? Your parents were born in a high-risk country, and you have not been immunized against hepatitis B (hepatitis B vaccine). ? You have HIV or AIDS. ? You use needles to inject street drugs. ? You live with someone who has hepatitis B. ? You have had sex with someone who has hepatitis B. ? You get hemodialysis treatment. ? You take certain medicines for conditions, including cancer, organ transplantation, and autoimmune conditions.  Hepatitis C  Blood testing is recommended for: ? Everyone born from [birth year] through 1965. ? Anyone with known risk factors for hepatitis C.  Sexually transmitted infections (STIs)  You should be screened for sexually transmitted infections (STIs) including gonorrhea and chlamydia if: ? You are sexually active and are younger than 63 years of age. ? You are older than 63 years of age and your health care provider tells you that you are at risk for this type of infection. ? Your sexual activity has changed since you were last screened and you are at an increased risk for chlamydia or gonorrhea. Ask your health care provider if you are at risk.  If you do not have HIV, but are at risk, it may be recommended that you take a prescription medicine daily to prevent HIV infection. This is called pre-exposure prophylaxis (PrEP). You are considered at risk if: ? You are sexually active and do not regularly use condoms or know the HIV status of your  partner(s). ? You take drugs by injection. ? You are sexually active with a partner who has HIV.  Talk with your health care provider about whether you are at high risk of being infected with HIV. If you choose to begin PrEP, you should first be tested for HIV. You should then be tested every 3 months for as long as you are taking PrEP. Pregnancy  If you are premenopausal and  you may become pregnant, ask your health care provider about preconception counseling.  If you may become pregnant, take 400 to 800 micrograms (mcg) of folic acid every day.  If you want to prevent pregnancy, talk to your health care provider about birth control (contraception). Osteoporosis and menopause  Osteoporosis is a disease in which the bones lose minerals and strength with aging. This can result in serious bone fractures. Your risk for osteoporosis can be identified using a bone density scan.  If you are 59 years of age or older, or if you are at risk for osteoporosis and fractures, ask your health care provider if you should be screened.  Ask your health care provider whether you should take a calcium or vitamin D supplement to lower your risk for osteoporosis.  Menopause may have certain physical symptoms and risks.  Hormone replacement therapy may reduce some of these symptoms and risks. Talk to your health care provider about whether hormone replacement therapy is right for you. Follow these instructions at home:  Schedule regular health, dental, and eye exams.  Stay current with your immunizations.  Do not use any tobacco products including cigarettes, chewing tobacco, or electronic cigarettes.  If you are pregnant, do not drink alcohol.  If you are breastfeeding, limit how much and how often you drink alcohol.  Limit alcohol intake to no more than 1 drink per day for nonpregnant women. One drink equals 12 ounces of beer, 5 ounces of wine, or 1 ounces of hard liquor.  Do not use street  drugs.  Do not share needles.  Ask your health care provider for help if you need support or information about quitting drugs.  Tell your health care provider if you often feel depressed.  Tell your health care provider if you have ever been abused or do not feel safe at home. This information is not intended to replace advice given to you by your health care provider. Make sure you discuss any questions you have with your health care provider. Document Released: 07/11/2010 Document Revised: 06/03/2015 Document Reviewed: 09/29/2014 Elsevier Interactive Patient Education  [employer].

## 2018-01-07 LAB — HM MAMMOGRAPHY

## 2018-01-15 ENCOUNTER — Encounter (INDEPENDENT_AMBULATORY_CARE_PROVIDER_SITE_OTHER): Payer: BLUE CROSS/BLUE SHIELD | Admitting: Ophthalmology

## 2018-01-23 ENCOUNTER — Encounter (INDEPENDENT_AMBULATORY_CARE_PROVIDER_SITE_OTHER): Payer: BLUE CROSS/BLUE SHIELD | Admitting: Ophthalmology

## 2018-01-23 DIAGNOSIS — E113512 Type 2 diabetes mellitus with proliferative diabetic retinopathy with macular edema, left eye: Secondary | ICD-10-CM

## 2018-01-23 DIAGNOSIS — E11311 Type 2 diabetes mellitus with unspecified diabetic retinopathy with macular edema: Secondary | ICD-10-CM

## 2018-01-23 DIAGNOSIS — E113211 Type 2 diabetes mellitus with mild nonproliferative diabetic retinopathy with macular edema, right eye: Secondary | ICD-10-CM

## 2018-01-23 LAB — HM DIABETES EYE EXAM

## 2018-01-24 ENCOUNTER — Encounter (INDEPENDENT_AMBULATORY_CARE_PROVIDER_SITE_OTHER): Payer: BLUE CROSS/BLUE SHIELD | Admitting: Ophthalmology

## 2018-01-30 ENCOUNTER — Encounter: Payer: Self-pay | Admitting: Internal Medicine

## 2018-01-30 NOTE — Progress Notes (Signed)
Abstracted and sent to scan  

## 2018-02-14 ENCOUNTER — Ambulatory Visit: Payer: BLUE CROSS/BLUE SHIELD | Admitting: Endocrinology

## 2018-02-18 ENCOUNTER — Other Ambulatory Visit: Payer: Self-pay | Admitting: Internal Medicine

## 2018-02-18 ENCOUNTER — Ambulatory Visit: Payer: BLUE CROSS/BLUE SHIELD | Admitting: Endocrinology

## 2018-02-18 ENCOUNTER — Telehealth: Payer: Self-pay | Admitting: Endocrinology

## 2018-02-18 ENCOUNTER — Other Ambulatory Visit: Payer: Self-pay

## 2018-02-18 MED ORDER — NITROFURANTOIN MONOHYD MACRO 100 MG PO CAPS: 100.0000 mg | ORAL_CAPSULE | Freq: Two times a day (BID) | ORAL | 0 refills | Status: DC

## 2018-02-18 MED ORDER — METFORMIN HCL 500 MG PO TABS: 1000.0000 mg | ORAL_TABLET | Freq: Two times a day (BID) | ORAL | 0 refills | Status: DC

## 2018-02-18 NOTE — Telephone Encounter (Signed)
MEDICATION: metFORMIN (GLUCOPHAGE) 500 MG tablet  PHARMACY:  [facility] on [address]  IS THIS A 90 DAY SUPPLY : Yes-not sure  IS PATIENT OUT OF MEDICATION: No  IF NOT; HOW MUCH IS LEFT: enough until 03/01/18  LAST APPOINTMENT DATE: @9 /05/2017  NEXT APPOINTMENT DATE:@Visit  date not found  DO WE HAVE YOUR PERMISSION TO LEAVE A DETAILED MESSAGE:Yes  OTHER COMMENTS: Patient had a physical and A1C checked in December-Dr. Sharlet Salina.    **Let patient know to contact pharmacy at the end of the day to make sure medication is ready. **  ** Please notify patient to allow 48-72 hours to process**  **Encourage patient to contact the pharmacy for refills or they can request refills through [facility]**

## 2018-02-18 NOTE — Telephone Encounter (Signed)
Rx has been sent  

## 2018-02-27 ENCOUNTER — Encounter (INDEPENDENT_AMBULATORY_CARE_PROVIDER_SITE_OTHER): Payer: BLUE CROSS/BLUE SHIELD | Admitting: Ophthalmology

## 2018-03-06 ENCOUNTER — Encounter: Payer: Self-pay | Admitting: Internal Medicine

## 2018-03-06 NOTE — Progress Notes (Signed)
Abstracted and sent to scan  

## 2018-03-22 ENCOUNTER — Other Ambulatory Visit: Payer: Self-pay | Admitting: Endocrinology

## 2018-04-03 ENCOUNTER — Other Ambulatory Visit: Payer: Self-pay | Admitting: Endocrinology

## 2018-04-03 ENCOUNTER — Telehealth: Payer: Self-pay | Admitting: Endocrinology

## 2018-04-03 NOTE — Telephone Encounter (Signed)
Patient stated that her prescription was denied due to her not being seen since sept. She stated that her PCP has checked her A1C and it was fine .she wanted to see if Dr Loanne Drilling could still approve for the to get the metformin until she is able to see him

## 2018-04-03 NOTE — Telephone Encounter (Signed)
Notified pt refilled Rx Metformin  #120, R-0. Pt have appt 04/05/18 f/u.

## 2018-04-04 ENCOUNTER — Other Ambulatory Visit: Payer: Self-pay

## 2018-04-05 ENCOUNTER — Encounter: Payer: Self-pay | Admitting: Endocrinology

## 2018-04-05 ENCOUNTER — Other Ambulatory Visit: Payer: Self-pay

## 2018-04-05 ENCOUNTER — Ambulatory Visit: Payer: BLUE CROSS/BLUE SHIELD | Admitting: Endocrinology

## 2018-04-05 VITALS — BP 140/76 | HR 97 | Temp 98.3°F | Ht 62.0 in | Wt 149.6 lb

## 2018-04-05 DIAGNOSIS — E1139 Type 2 diabetes mellitus with other diabetic ophthalmic complication: Secondary | ICD-10-CM

## 2018-04-05 LAB — POCT GLYCOSYLATED HEMOGLOBIN (HGB A1C): Hemoglobin A1C: 7.5 % — AB (ref 4.0–5.6)

## 2018-04-05 NOTE — Patient Instructions (Addendum)
Your blood pressure is high today.  Please see your primary care provider soon, to have it rechecked Please come back for a follow-up appointment in 3-4 months check your blood sugar once a day.  vary the time of day when you check, between before the 3 meals, and at bedtime.  also check if you have symptoms of your blood sugar being too high or too low.  please keep a record of the readings and bring it to your next appointment here.  You can write it on any piece of paper.  please call us sooner if your blood sugar goes below 70, or if you have a lot of readings over 200.

## 2018-04-05 NOTE — Progress Notes (Signed)
Subjective:    Patient ID: Lindsay Henderson, female    DOB: Jul 07, 1955, 63 y.o.   MRN: [id number]  HPI Pt returns for f/u of diabetes mellitus: DM type: 2 Dx'ed: 0254 Complications: DR.   Therapy: 4 oral meds GDM: (1987 and 1992).   DKA: never Severe hypoglycemia: never Pancreatitis: never Other: she took insulin only for GDM; she could not afford invokana; she tolerated jardiance 10 mg, but not 25 mg (yeast infection); repaglinide was changed to glimepiride, as she could not take TID; she did not tolerate bromocriptine (nausea); pioglitazone is contraindicated by macular edema.  Interval history: no cbg record, but states cbg's vary from 90-200.  pt states she feels well in general.  She says she seldom misses the medications.  She seldom has hypoglycemia sxs, and these episodes are mild.   Past Medical History:  Diagnosis Date  . Adenomatous colon polyp 11/2005  . Allergic rhinitis   . Allergy   . Anomalous atrioventricular excitation   . Anxiety    no per pt  . Asthma   . Diverticulosis of colon   . Gastritis   . GERD (gastroesophageal reflux disease)   . History of supraventricular tachycardia   . Hypercholesteremia   . Kidney stones   . Nephrolithiasis   . Renal cyst   . Somatic dysfunction   . Type II or unspecified type diabetes mellitus without mention of complication, not stated as uncontrolled   . Vitamin D deficiency     Past Surgical History:  Procedure Laterality Date  . CARDIAC ELECTROPHYSIOLOGY [place] AND ABLATION  2004  . COLONOSCOPY  2007  . TUBAL LIGATION      Social History   Socioeconomic History  . Marital status: Married    Spouse name: Not on file  . Number of children: 4  . Years of education: Not on file  . Highest education level: Not on file  Occupational History  . Occupation: [employer]  Social Needs  . Financial resource strain: Not on file  . Food insecurity:    Worry: Not on file    Inability: Not on file  .  Transportation needs:    Medical: Not on file    Non-medical: Not on file  Tobacco Use  . Smoking status: Never Smoker  . Smokeless tobacco: Never Used  Substance and Sexual Activity  . Alcohol use: No  . Drug use: No  . Sexual activity: Not Currently  Lifestyle  . Physical activity:    Days per week: Not on file    Minutes per session: Not on file  . Stress: Not on file  Relationships  . Social connections:    Talks on phone: Not on file    Gets together: Not on file    Attends religious service: Not on file    Active member of club or organization: Not on file    Attends meetings of clubs or organizations: Not on file    Relationship status: Not on file  . Intimate partner violence:    Fear of current or ex partner: Not on file    Emotionally abused: Not on file    Physically abused: Not on file    Forced sexual activity: Not on file  Other Topics Concern  . Not on file  Social History Narrative  . Not on file    Current Outpatient Medications on File Prior to Visit  Medication Sig Dispense Refill  . albuterol (PROAIR HFA) 108 (90 Base) MCG/ACT  inhaler Inhale 2 puffs into the lungs every 6 (six) hours as needed. 1 Inhaler 2  . beclomethasone (QVAR) 80 MCG/ACT inhaler Inhale 1 puff into the lungs 2 (two) times daily. 1 Inhaler 6  . clotrimazole-betamethasone (LOTRISONE) cream Apply 1 application topically 2 (two) times daily. 45 g 2  . empagliflozin (JARDIANCE) 10 MG TABS tablet Take 10 mg by mouth daily. 30 tablet 11  . esomeprazole (NEXIUM) 20 MG capsule Take 20 mg by mouth daily at 12 noon.    . fexofenadine ([name]) 180 MG tablet Take 180 mg by mouth daily.    . fluconazole (DIFLUCAN) 150 MG tablet Take 1 tablet (150 mg total) by mouth every 3 (three) days. 3 tablet 3  . fluticasone (FLONASE) 50 MCG/ACT nasal spray Place 2 sprays into both nostrils daily.    Marland Kitchen glimepiride (AMARYL) 2 MG tablet Take 1 tablet (2 mg total) by mouth daily before breakfast. 90 tablet 3  .  metFORMIN (GLUCOPHAGE) 500 MG tablet TAKE 2 TABLETS BY MOUTH TWICE DAILY WITH A MEAL 120 tablet 0  . nitrofurantoin, macrocrystal-monohydrate, (MACROBID) 100 MG capsule Take 1 capsule (100 mg total) by mouth 2 (two) times daily. 14 capsule 0  . ONGLYZA 5 MG TABS tablet TAKE 1 TABLET BY MOUTH ONCE DAILY 30 tablet 11  . rosuvastatin (CRESTOR) 5 MG tablet Take 1 tablet (5 mg total) by mouth every other day. 45 tablet 3   No current facility-administered medications on file prior to visit.     Allergies  Allergen Reactions  . Parlodel [Bromocriptine Mesylate] Nausea Only    nausea  . Peanut-Containing Drug Products Hives, Itching and Swelling  . Penicillins     REACTION: rash and sob  . Pioglitazone Diarrhea    tremor  . Sulfa Antibiotics Hives  . Zithromax [Azithromycin] Nausea And Vomiting    Family History  Problem Relation Age of Onset  . Diabetes Father   . Hypertension Father   . Hyperlipidemia Father   . Breast cancer Mother   . Esophageal cancer Neg Hx   . Stomach cancer Neg Hx   . Colon cancer Neg Hx   . Rectal cancer Neg Hx     BP 140/76 (BP Location: Left Arm, Patient Position: Sitting, Cuff Size: Normal)   Pulse 97   Temp 98.3 F (36.8 C) (Oral)   Ht 5\' 2"  (1.575 m)   Wt 149 lb 9.6 oz (67.9 kg)   SpO2 98%   BMI 27.36 kg/m    Review of Systems Denies LOC    Objective:   Physical Exam VITAL SIGNS:  See vs page GENERAL: no distress Pulses: dorsalis pedis intact bilat.   MSK: no deformity of the feet CV: no leg edema Skin:  no ulcer on the feet.  normal color and temp on the feet. Neuro: sensation is intact to touch on the feet Ext: multiple toenails are ingrown, but no erythema/swell/drainage  Lab Results  Component Value Date   CREATININE 0.70 12/11/2017   BUN 10 12/11/2017   NA 140 12/11/2017   K 3.8 12/11/2017   CL 103 12/11/2017   CO2 28 12/11/2017      Lab Results  Component Value Date   HGBA1C 7.5 (A) 04/05/2018      Assessment &  Plan:  Type 2 DM, with DR: this is the best control this pt should aim for, given this SU-containing regimen.   HTN: is noted today.  Macular edema: This limits rx options.    Patient  Instructions  Your blood pressure is high today.  Please see your primary care provider soon, to have it rechecked Please come back for a follow-up appointment in 3-4 months check your blood sugar once a day.  vary the time of day when you check, between before the 3 meals, and at bedtime.  also check if you have symptoms of your blood sugar being too high or too low.  please keep a record of the readings and bring it to your next appointment here.  You can write it on any piece of paper.  please call us sooner if your blood sugar goes below 70, or if you have a lot of readings over 200.

## 2018-04-18 ENCOUNTER — Ambulatory Visit (INDEPENDENT_AMBULATORY_CARE_PROVIDER_SITE_OTHER): Payer: BLUE CROSS/BLUE SHIELD | Admitting: Internal Medicine

## 2018-04-18 ENCOUNTER — Encounter: Payer: Self-pay | Admitting: Internal Medicine

## 2018-04-18 DIAGNOSIS — M25511 Pain in right shoulder: Secondary | ICD-10-CM | POA: Diagnosis not present

## 2018-04-18 DIAGNOSIS — M25512 Pain in left shoulder: Secondary | ICD-10-CM

## 2018-04-18 DIAGNOSIS — M25519 Pain in unspecified shoulder: Secondary | ICD-10-CM | POA: Insufficient documentation

## 2018-04-18 MED ORDER — CYCLOBENZAPRINE HCL 10 MG PO TABS: 10.0000 mg | ORAL_TABLET | Freq: Three times a day (TID) | ORAL | 0 refills | Status: DC | PRN

## 2018-04-18 NOTE — Assessment & Plan Note (Signed)
Suspect muscular strain and rx for flexeril. Can continue ibuprofen as needed and heat for pain. Advised that this could take a few weeks. Given stretching exercises for neck and shoulder to help as well.

## 2018-04-18 NOTE — Progress Notes (Signed)
Virtual Visit via Video Note  I connected with Lindsay Henderson on 04/18/18 at  9:00 AM EDT by a video enabled telemedicine application and verified that I am speaking with the correct person using two identifiers.   I discussed the limitations of evaluation and management by telemedicine and the availability of in person appointments. The patient expressed understanding and agreed to proceed.  History of Present Illness: The patient is a 63 y.o. YO female with visit for neck and shoulder pain. Started Sunday after she was lifting some heavy plants in pots to water them. Has pain in the neck and shoulder region about 8/10. She is taking ibuprofen or aleve and this is helping some. Denies numbness or weakness in her arms. Overall it is stable since onset. Has tried ibuprofen and aleve. Heating pad is helping a lot and she is sleeping okay.  Observations/Objective: Appearance: normal, breathing appears normal, casual grooming, abdomen does not appear distended, mental status is A and O times 3  Assessment and Plan: See problem oriented charting  Follow Up Instructions: rx for flexeril and stretching exercises  I discussed the assessment and treatment plan with the patient. The patient was provided an opportunity to ask questions and all were answered. The patient agreed with the plan and demonstrated an understanding of the instructions.   The patient was advised to call back or seek an in-person evaluation if the symptoms worsen or if the condition fails to improve as anticipated.  Hoyt Koch, MD

## 2018-04-20 ENCOUNTER — Telehealth: Payer: Self-pay | Admitting: Family Medicine

## 2018-04-20 NOTE — Telephone Encounter (Signed)
Please f/u with PCP on Monday or go to urgent care if worsening.

## 2018-04-20 NOTE — Telephone Encounter (Signed)
Right shoulder pain, pain level 7-8. Patient stated it is not getting better and pain is now a shooting pain through shoulder. No available appointments. Patient call number [phone]. Please advise.

## 2018-04-22 ENCOUNTER — Telehealth: Payer: Self-pay | Admitting: Internal Medicine

## 2018-04-22 NOTE — Telephone Encounter (Signed)
Patient called 04/20/18 at 10:27am.  Patient states that she seen Sharlet Salina for a virtual visit.  Patient states the pain she had has moved to her shoulder and upper arm.  Patient stated meds was not helping.  Please see last phone note.  Patient was advised to follow up with MD on Monday.  Please advise.

## 2018-04-23 NOTE — Telephone Encounter (Signed)
Do we know what meds she is currently taking and what meds she is saying were not helping?

## 2018-04-23 NOTE — Telephone Encounter (Signed)
Taking flexeril 10mg   3times prn also taking ibuprofen 200mg  every 8 hours with her muscle relaxer and will sometimes take one more ibuprofen if the pain wont go away. States that she has been trying to do the exercises that was given but that hurts worse so she stopped doing them states that she is having a throbbing pain that will come and go. The pain is now in the shoulders. Decided to try bengay to help which was not doing much then got icy hot patch which will help but the pain always come back. Has also tried bio freeze and a heating pad once in awhile.

## 2018-04-23 NOTE — Telephone Encounter (Signed)
Patient informed of MD response and stated understanding  

## 2018-04-23 NOTE — Telephone Encounter (Signed)
This pain is likely muscular and can try ibuprofen 600 mg 3 times per day. Use flexeril only if helping. Can use any topicals over the counter as often as needed. This should resolve in the next week or two.

## 2018-05-23 ENCOUNTER — Other Ambulatory Visit: Payer: Self-pay | Admitting: Endocrinology

## 2018-06-06 ENCOUNTER — Ambulatory Visit (INDEPENDENT_AMBULATORY_CARE_PROVIDER_SITE_OTHER): Payer: BLUE CROSS/BLUE SHIELD | Admitting: Internal Medicine

## 2018-06-06 ENCOUNTER — Encounter: Payer: Self-pay | Admitting: Internal Medicine

## 2018-06-06 ENCOUNTER — Ambulatory Visit: Payer: Self-pay

## 2018-06-06 DIAGNOSIS — J0110 Acute frontal sinusitis, unspecified: Secondary | ICD-10-CM

## 2018-06-06 MED ORDER — DOXYCYCLINE HYCLATE 100 MG PO TABS: 100.0000 mg | ORAL_TABLET | Freq: Two times a day (BID) | ORAL | 0 refills | Status: DC

## 2018-06-06 NOTE — Telephone Encounter (Signed)
Patient on schedule today for doxy

## 2018-06-06 NOTE — Assessment & Plan Note (Signed)
Rx for doxycycline given pcn allergy and intolerance to azithromycin.

## 2018-06-06 NOTE — Progress Notes (Signed)
Virtual Visit via Video Note  I connected with Lindsay Henderson on 06/06/18 at 10:00 AM EDT by a video enabled telemedicine application and verified that I am speaking with the correct person using two identifiers.  The patient and the provider were at separate locations throughout the entire encounter.   I discussed the limitations of evaluation and management by telemedicine and the availability of in person appointments. The patient expressed understanding and agreed to proceed.  History of Present Illness: The patient is a 63 y.o. female with visit for sinus problems and potential infection. Started about 2 weeks ago. Has pain in the sinuses and ears, drainage, some dizziness with moving head. Denies fevers or chills or cough or SOB or muscle aches. Denies known exposure to covid-19. Overall it is worsening. Has tried nasonex, allegra, sinus rinses, meclizine for the dizziness which have helped but not enough.  Observations/Objective: Appearance: normal, breathing appears normal, pain to frontal sinus to self palpation, casual grooming, abdomen does not appear distended, throat red with clear drainage, nose with crusting and redness, mental status is A and O times 3, EOM intact bilaterally  Assessment and Plan: See problem oriented charting  Follow Up Instructions: rx doxycycline for sinus infection and continue nasonex, allegra, sinus rinse and meclizine prn  I discussed the assessment and treatment plan with the patient. The patient was provided an opportunity to ask questions and all were answered. The patient agreed with the plan and demonstrated an understanding of the instructions.   The patient was advised to call back or seek an in-person evaluation if the symptoms worsen or if the condition fails to improve as anticipated.  Hoyt Koch, MD

## 2018-06-06 NOTE — Telephone Encounter (Signed)
Pt called stating that she ias been treating sinus pressure and pain for several days.  She has now developed ear pain and dizziness.  She denies fever or cough.  Her nose is stuffy with thick green/yellow discharge that is blood streaked. She is treating with aleve and allegra and Flonase. Care advice read to patient. Pt verbalized understanding of instructions. Call transferred to office for appointment scheduling.  Reason for Disposition . Earache  Answer Assessment - Initial Assessment Questions 1. LOCATION: "Where does it hurt?"      Left ear cheeks are sore both side and top of head 2. ONSET: "When did the sinus pain start?"  (e.g., hours, days)      Several weeks 3. SEVERITY: "How bad is the pain?"   (Scale 1-10; mild, moderate or severe)   - MILD (1-3): doesn't interfere with normal activities    - MODERATE (4-7): interferes with normal activities (e.g., work or school) or awakens from sleep   - SEVERE (8-10): excruciating pain and patient unable to do any normal activities       5 4. RECURRENT SYMPTOM: "Have you ever had sinus problems before?" If so, ask: "When was the last time?" and "What happened that time?"     Sinus with allergies 5. NASAL CONGESTION: "Is the nose blocked?" If so, ask, "Can you open it or must you breathe through the mouth?"    stuffy 6. NASAL DISCHARGE: "Do you have discharge from your nose?" If so ask, "What color?"     Green yellow with blood streaks 7. FEVER: "Do you have a fever?" If so, ask: "What is it, how was it measured, and when did it start?"     No 8. OTHER SYMPTOMS: "Do you have any other symptoms?" (e.g., sore throat, cough, earache, difficulty breathing)    earache 9. PREGNANCY: "Is there any chance you are pregnant?" "When was your last menstrual period?"    N/A  Protocols used: SINUS PAIN OR CONGESTION-A-AH

## 2018-06-19 ENCOUNTER — Ambulatory Visit: Payer: BLUE CROSS/BLUE SHIELD | Admitting: Internal Medicine

## 2018-07-02 ENCOUNTER — Other Ambulatory Visit: Payer: Self-pay | Admitting: Endocrinology

## 2018-07-23 ENCOUNTER — Other Ambulatory Visit: Payer: Self-pay | Admitting: Internal Medicine

## 2018-07-23 ENCOUNTER — Other Ambulatory Visit: Payer: Self-pay | Admitting: Endocrinology

## 2018-08-01 ENCOUNTER — Other Ambulatory Visit: Payer: Self-pay

## 2018-08-05 ENCOUNTER — Encounter: Payer: Self-pay | Admitting: Endocrinology

## 2018-08-05 ENCOUNTER — Ambulatory Visit: Payer: BLUE CROSS/BLUE SHIELD | Admitting: Endocrinology

## 2018-08-05 ENCOUNTER — Other Ambulatory Visit: Payer: Self-pay

## 2018-08-05 VITALS — BP 140/78 | HR 68 | Ht 62.0 in | Wt 150.6 lb

## 2018-08-05 DIAGNOSIS — E1139 Type 2 diabetes mellitus with other diabetic ophthalmic complication: Secondary | ICD-10-CM | POA: Diagnosis not present

## 2018-08-05 DIAGNOSIS — J0110 Acute frontal sinusitis, unspecified: Secondary | ICD-10-CM

## 2018-08-05 LAB — POCT GLYCOSYLATED HEMOGLOBIN (HGB A1C): Hemoglobin A1C: 7.9 % — AB (ref 4.0–5.6)

## 2018-08-05 MED ORDER — FLUCONAZOLE 150 MG PO TABS: 150.0000 mg | ORAL_TABLET | ORAL | 0 refills | Status: DC

## 2018-08-05 MED ORDER — GLIMEPIRIDE 4 MG PO TABS: 4.0000 mg | ORAL_TABLET | Freq: Every day | ORAL | 3 refills | Status: DC

## 2018-08-05 NOTE — Patient Instructions (Addendum)
Your blood pressure is high today.  Please see your primary care provider soon, to have it rechecked. I have sent a prescription to your pharmacy, for the fluconazole.  Please ask Dr Sharlet Salina if this does not help.  Also, I have sent a prescription to your pharmacy, to double the glimepiride.   Please continue the same other diabetes medications.  Please come back for a follow-up appointment in 3 months.   check your blood sugar once a day.  vary the time of day when you check, between before the 3 meals, and at bedtime.  also check if you have symptoms of your blood sugar being too high or too low.  please keep a record of the readings and bring it to your next appointment here.  You can write it on any piece of paper.  please call us sooner if your blood sugar goes below 70, or if you have a lot of readings over 200.

## 2018-08-05 NOTE — Progress Notes (Signed)
Subjective:    Patient ID: Lindsay Henderson, female    DOB: 10-08-1955, 63 y.o.   MRN: [id number]  HPI Pt returns for f/u of diabetes mellitus: DM type: 2 Dx'ed: 7867 Complications: DR.   Therapy: 3 oral meds GDM: (1987 and 1992).   DKA: never Severe hypoglycemia: never Pancreatitis: never Other: she took insulin only for GDM; she could not afford invokana; she tolerated jardiance 10 mg, but not 25 mg (yeast infection); repaglinide was changed to glimepiride, as she could not take TID; she did not tolerate bromocriptine (nausea); pioglitazone is contraindicated by macular edema.  Interval history: no cbg record, but states cbg's vary from 80-250.  pt states she feels well in general.  She says she seldom misses the medications.   Past Medical History:  Diagnosis Date  . Adenomatous colon polyp 11/2005  . Allergic rhinitis   . Allergy   . Anomalous atrioventricular excitation   . Anxiety    no per pt  . Asthma   . Diverticulosis of colon   . Gastritis   . GERD (gastroesophageal reflux disease)   . History of supraventricular tachycardia   . Hypercholesteremia   . Kidney stones   . Nephrolithiasis   . Renal cyst   . Somatic dysfunction   . Type II or unspecified type diabetes mellitus without mention of complication, not stated as uncontrolled   . Vitamin D deficiency     Past Surgical History:  Procedure Laterality Date  . CARDIAC ELECTROPHYSIOLOGY [place] AND ABLATION  2004  . COLONOSCOPY  2007  . TUBAL LIGATION      Social History   Socioeconomic History  . Marital status: Married    Spouse name: Not on file  . Number of children: 4  . Years of education: Not on file  . Highest education level: Not on file  Occupational History  . Occupation: [employer]  Social Needs  . Financial resource strain: Not on file  . Food insecurity    Worry: Not on file    Inability: Not on file  . Transportation needs    Medical: Not on file    Non-medical: Not on  file  Tobacco Use  . Smoking status: Never Smoker  . Smokeless tobacco: Never Used  Substance and Sexual Activity  . Alcohol use: No  . Drug use: No  . Sexual activity: Not Currently  Lifestyle  . Physical activity    Days per week: Not on file    Minutes per session: Not on file  . Stress: Not on file  Relationships  . Social Herbalist on phone: Not on file    Gets together: Not on file    Attends religious service: Not on file    Active member of club or organization: Not on file    Attends meetings of clubs or organizations: Not on file    Relationship status: Not on file  . Intimate partner violence    Fear of current or ex partner: Not on file    Emotionally abused: Not on file    Physically abused: Not on file    Forced sexual activity: Not on file  Other Topics Concern  . Not on file  Social History Narrative  . Not on file    Current Outpatient Medications on File Prior to Visit  Medication Sig Dispense Refill  . beclomethasone (QVAR) 80 MCG/ACT inhaler Inhale 1 puff into the lungs 2 (two) times daily. 1 Inhaler 6  .  clotrimazole-betamethasone (LOTRISONE) cream Apply 1 application topically 2 (two) times daily. 45 g 2  . cyclobenzaprine (FLEXERIL) 10 MG tablet Take 1 tablet (10 mg total) by mouth 3 (three) times daily as needed for muscle spasms. 45 tablet 0  . esomeprazole (NEXIUM) 20 MG capsule Take 20 mg by mouth daily at 12 noon.    . fexofenadine ([name]) 180 MG tablet Take 180 mg by mouth daily.    . fluticasone (FLONASE) 50 MCG/ACT nasal spray Place 2 sprays into both nostrils daily.    . metFORMIN (GLUCOPHAGE) 500 MG tablet TAKE 2 TABLETS BY MOUTH TWICE DAILY WITH A MEAL 120 tablet 0  . ONGLYZA 5 MG TABS tablet Take 1 tablet by mouth once daily 60 tablet 0  . PROAIR HFA 108 (90 Base) MCG/ACT inhaler INHALE 2 PUFFS INTO LUNGS EVERY 6 HOURS AS NEEDED 9 g 2   No current facility-administered medications on file prior to visit.     Allergies   Allergen Reactions  . Parlodel [Bromocriptine Mesylate] Nausea Only    nausea  . Peanut-Containing Drug Products Hives, Itching and Swelling  . Penicillins     REACTION: rash and sob  . Pioglitazone Diarrhea    tremor  . Sulfa Antibiotics Hives  . Zithromax [Azithromycin] Nausea And Vomiting    Family History  Problem Relation Age of Onset  . Diabetes Father   . Hypertension Father   . Hyperlipidemia Father   . Breast cancer Mother   . Esophageal cancer Neg Hx   . Stomach cancer Neg Hx   . Colon cancer Neg Hx   . Rectal cancer Neg Hx     BP 140/78 (BP Location: Left Arm, Patient Position: Sitting, Cuff Size: Normal)   Pulse 68   Ht 5\' 2"  (1.575 m)   Wt 150 lb 9.6 oz (68.3 kg)   SpO2 99%   BMI 27.55 kg/m    Review of Systems She denies hypoglycemia.  She has intermitt vaginal burning.      Objective:   Physical Exam VITAL SIGNS:  See vs page GENERAL: no distress Pulses: dorsalis pedis intact bilat.   MSK: no deformity of the feet CV: no leg edema Skin:  no ulcer on the feet.  normal color and temp on the feet. Neuro: sensation is intact to touch on the feet Ext: both great toenails are ingrown, but no erythema/tend/swelling/drainage  A1c=7.9%     Assessment & Plan:  HTN: is noted today Vaginal sxs, recurrent Type 2 DM, with DR: worse  Patient Instructions  Your blood pressure is high today.  Please see your primary care provider soon, to have it rechecked. I have sent a prescription to your pharmacy, for the fluconazole.  Please ask Dr Sharlet Salina if this does not help.  Also, I have sent a prescription to your pharmacy, to double the glimepiride.   Please continue the same other diabetes medications.  Please come back for a follow-up appointment in 3 months.   check your blood sugar once a day.  vary the time of day when you check, between before the 3 meals, and at bedtime.  also check if you have symptoms of your blood sugar being too high or too low.   please keep a record of the readings and bring it to your next appointment here.  You can write it on any piece of paper.  please call us sooner if your blood sugar goes below 70, or if you have a lot of readings over  200.        

## 2018-08-24 ENCOUNTER — Other Ambulatory Visit: Payer: Self-pay | Admitting: Endocrinology

## 2018-09-04 ENCOUNTER — Other Ambulatory Visit: Payer: BLUE CROSS/BLUE SHIELD

## 2018-09-04 ENCOUNTER — Encounter: Payer: Self-pay | Admitting: Family

## 2018-09-04 ENCOUNTER — Other Ambulatory Visit: Payer: Self-pay

## 2018-09-04 ENCOUNTER — Ambulatory Visit (INDEPENDENT_AMBULATORY_CARE_PROVIDER_SITE_OTHER): Payer: BLUE CROSS/BLUE SHIELD | Admitting: Family

## 2018-09-04 VITALS — BP 130/80 | HR 90 | Temp 98.0°F | Ht 62.0 in | Wt 149.8 lb

## 2018-09-04 DIAGNOSIS — R300 Dysuria: Secondary | ICD-10-CM | POA: Diagnosis not present

## 2018-09-04 LAB — POC URINALSYSI DIPSTICK (AUTOMATED)
Bilirubin, UA: NEGATIVE
Blood, UA: NEGATIVE
Glucose, UA: NEGATIVE
Ketones, UA: NEGATIVE
Leukocytes, UA: NEGATIVE
Nitrite, UA: NEGATIVE
Protein, UA: NEGATIVE
Spec Grav, UA: 1.01 (ref 1.010–1.025)
Urobilinogen, UA: 1 E.U./dL
pH, UA: 6 (ref 5.0–8.0)

## 2018-09-04 MED ORDER — NITROFURANTOIN MONOHYD MACRO 100 MG PO CAPS: 100.0000 mg | ORAL_CAPSULE | Freq: Two times a day (BID) | ORAL | 0 refills | Status: DC

## 2018-09-04 NOTE — Addendum Note (Signed)
Addended by: Marcina Millard on: 09/04/2018 11:31 AM   Modules accepted: Orders

## 2018-09-04 NOTE — Progress Notes (Signed)
Lindsay Henderson is a 63 y.o. female with the following history as recorded in Epic[employer]:  Patient Active Problem List   Diagnosis Date Noted  . Shoulder pain 04/18/2018  . Eye problem 08/17/2017  . Acute sinusitis 08/19/2016  . Routine general medical examination at a health care facility 09/03/2015  . Diabetes ([place]) 05/29/2015  . Vitamin D deficiency 05/24/2009  . NEPHROLITHIASIS 10/23/2007  . SUPRAVENTRICULAR TACHYCARDIA 03/07/2007  . Asthma 03/07/2007  . Hyperlipidemia associated with type 2 diabetes mellitus ([place]) 12/15/2006  . Allergic rhinitis 12/15/2006  . GERD 12/15/2006    Current Outpatient Medications  Medication Sig Dispense Refill  . beclomethasone (QVAR) 80 MCG/ACT inhaler Inhale 1 puff into the lungs 2 (two) times daily. 1 Inhaler 6  . clotrimazole-betamethasone (LOTRISONE) cream Apply 1 application topically 2 (two) times daily. 45 g 2  . cyclobenzaprine (FLEXERIL) 10 MG tablet Take 1 tablet (10 mg total) by mouth 3 (three) times daily as needed for muscle spasms. 45 tablet 0  . esomeprazole (NEXIUM) 20 MG capsule Take 20 mg by mouth daily at 12 noon.    . fexofenadine ([name]) 180 MG tablet Take 180 mg by mouth daily.    . fluconazole (DIFLUCAN) 150 MG tablet Take 1 tablet (150 mg total) by mouth every 3 (three) days. 3 tablet 0  . fluticasone (FLONASE) 50 MCG/ACT nasal spray Place 2 sprays into both nostrils daily.    Marland Kitchen glimepiride (AMARYL) 4 MG tablet Take 1 tablet (4 mg total) by mouth daily with breakfast. 90 tablet 3  . metFORMIN (GLUCOPHAGE) 500 MG tablet TAKE 2 TABLETS BY MOUTH TWICE DAILY WITH A MEAL 120 tablet 0  . ONGLYZA 5 MG TABS tablet Take 1 tablet by mouth once daily 60 tablet 0  . PROAIR HFA 108 (90 Base) MCG/ACT inhaler INHALE 2 PUFFS INTO LUNGS EVERY 6 HOURS AS NEEDED 9 g 2   No current facility-administered medications for this visit.     Allergies: Parlodel [bromocriptine mesylate], Peanut-containing drug products, Penicillins, Pioglitazone,  Sulfa antibiotics, and Zithromax [azithromycin]  Past Medical History:  Diagnosis Date  . Adenomatous colon polyp 11/2005  . Allergic rhinitis   . Allergy   . Anomalous atrioventricular excitation   . Anxiety    no per pt  . Asthma   . Diverticulosis of colon   . Gastritis   . GERD (gastroesophageal reflux disease)   . History of supraventricular tachycardia   . Hypercholesteremia   . Kidney stones   . Nephrolithiasis   . Renal cyst   . Somatic dysfunction   . Type II or unspecified type diabetes mellitus without mention of complication, not stated as uncontrolled   . Vitamin D deficiency     Past Surgical History:  Procedure Laterality Date  . CARDIAC ELECTROPHYSIOLOGY [place] AND ABLATION  2004  . COLONOSCOPY  2007  . TUBAL LIGATION      Family History  Problem Relation Age of Onset  . Diabetes Father   . Hypertension Father   . Hyperlipidemia Father   . Breast cancer Mother   . Esophageal cancer Neg Hx   . Stomach cancer Neg Hx   . Colon cancer Neg Hx   . Rectal cancer Neg Hx     Social History   Tobacco Use  . Smoking status: Never Smoker  . Smokeless tobacco: Never Used  Substance Use Topics  . Alcohol use: No    Subjective:  Patient presents with concerns for possible UTI; symptoms x 3-4 days; +  burning, frequency; is using OTC Azo with some relief; no fever, no back pain pain; may get a UTI 1-2 x per year.     Objective:  Vitals:   09/04/18 1005  BP: 130/80  Pulse: 90  Temp: 98 F (36.7 C)  TempSrc: Oral  SpO2: 95%  Weight: 149 lb 12.8 oz (67.9 kg)  Height: 5\' 2"  (1.575 m)    General: Well developed, well nourished, in no acute distress  Skin : Warm and dry.  Head: Normocephalic and atraumatic  Lungs: Respirations unlabored; clear to auscultation bilaterally without wheeze, rales, rhonchi  CVS exam: normal rate and regular rhythm.  Musculoskeletal: No deformities; no active joint inflammation  Extremities: No edema, cyanosis, clubbing   Vessels: Symmetric bilaterally  Neurologic: Alert and oriented; speech intact; face symmetrical; moves all extremities well; CNII-XII intact without focal deficit   Assessment:  1. Dysuria     Plan:  Suspect UTI; check U/A and urine culture; Rx for Macrobid 100 mg bid x 7 days; follow-up to be determined;  No follow-ups on file.  Orders Placed This Encounter  Procedures  . Urine Culture    Standing Status:   Future    Standing Expiration Date:   09/04/2019    Requested Prescriptions    No prescriptions requested or ordered in this encounter

## 2018-09-06 LAB — URINE CULTURE
MICRO NUMBER:: 813861
Result:: NO GROWTH
SPECIMEN QUALITY:: ADEQUATE

## 2018-09-09 ENCOUNTER — Telehealth: Payer: Self-pay

## 2018-09-09 NOTE — Telephone Encounter (Signed)
Patient saw laura 09/04/2018

## 2018-09-09 NOTE — Telephone Encounter (Signed)
Copied from [place] [id number]. Topic: General - Other >> Sep 06, 2018  4:51 PM Sheran Luz wrote: Patient requesting CMA to discuss lab results that were released through mychart.

## 2018-09-09 NOTE — Telephone Encounter (Signed)
Spoke with patient and info given 

## 2018-09-09 NOTE — Telephone Encounter (Signed)
Since the culture did not show any type of urinary tract infection, let's have her try the Diflucan. If the symptoms persist, please let us know.

## 2018-10-10 ENCOUNTER — Other Ambulatory Visit: Payer: Self-pay | Admitting: Endocrinology

## 2018-10-10 NOTE — Telephone Encounter (Signed)
Please advise if refill request for Diflucan is appropriate to refill

## 2018-10-23 ENCOUNTER — Other Ambulatory Visit: Payer: Self-pay

## 2018-10-23 ENCOUNTER — Other Ambulatory Visit: Payer: BLUE CROSS/BLUE SHIELD

## 2018-10-23 ENCOUNTER — Encounter: Payer: Self-pay | Admitting: Internal Medicine

## 2018-10-23 ENCOUNTER — Ambulatory Visit (INDEPENDENT_AMBULATORY_CARE_PROVIDER_SITE_OTHER): Payer: BLUE CROSS/BLUE SHIELD | Admitting: Internal Medicine

## 2018-10-23 VITALS — BP 138/80 | HR 84 | Temp 99.0°F | Ht 62.0 in | Wt 153.0 lb

## 2018-10-23 DIAGNOSIS — R300 Dysuria: Secondary | ICD-10-CM

## 2018-10-23 LAB — MICROALBUMIN / CREATININE URINE RATIO
Creatinine,U: 92 mg/dL
Microalb Creat Ratio: 1.3 mg/g (ref 0.0–30.0)
Microalb, Ur: 1.2 mg/dL (ref 0.0–1.9)

## 2018-10-23 LAB — POCT URINALYSIS DIPSTICK
Bilirubin, UA: NEGATIVE
Blood, UA: NEGATIVE
Glucose, UA: NEGATIVE
Nitrite, UA: NEGATIVE
Protein, UA: NEGATIVE
Spec Grav, UA: 1.025 (ref 1.010–1.025)
Urobilinogen, UA: 1 E.U./dL
pH, UA: 5.5 (ref 5.0–8.0)

## 2018-10-23 MED ORDER — FLUCONAZOLE 150 MG PO TABS: ORAL_TABLET | ORAL | 0 refills | Status: DC

## 2018-10-23 MED ORDER — CIPROFLOXACIN HCL 500 MG PO TABS: 500.0000 mg | ORAL_TABLET | Freq: Two times a day (BID) | ORAL | 0 refills | Status: DC

## 2018-10-23 NOTE — Patient Instructions (Signed)
We have sent in ciprofloxacin to take 1 pill twice a day for 5 days.  We have also sent in a yeast infection in case you get this.

## 2018-10-23 NOTE — Progress Notes (Signed)
   Subjective:   Patient ID: Lindsay Henderson, female    DOB: Feb 15, 1955, 63 y.o.   MRN: [id number]  HPI The patient is a 63 YO female coming in for potential UTI. She is having burning with urination for about 5-6 days. She did get some diflucan from her endocrinologist and this did not help. She stopped taking jardiance back in July. She denies change in diet. With the pandemic she is not drinking as many fluids. Denies fevers or chills. Taking azo over the counter which is helping some. Denies abdominal pain or back pain.   Review of Systems  Constitutional: Negative.   HENT: Negative.   Eyes: Negative.   Respiratory: Negative for cough, chest tightness and shortness of breath.   Cardiovascular: Negative for chest pain, palpitations and leg swelling.  Gastrointestinal: Negative for abdominal distention, abdominal pain, constipation, diarrhea, nausea and vomiting.  Musculoskeletal: Negative.   Skin: Negative.   Neurological: Negative.   Psychiatric/Behavioral: Negative.     Objective:  Physical Exam Constitutional:      Appearance: She is well-developed. She is obese.  HENT:     Head: Normocephalic and atraumatic.  Neck:     Musculoskeletal: Normal range of motion.  Cardiovascular:     Rate and Rhythm: Normal rate and regular rhythm.  Pulmonary:     Effort: Pulmonary effort is normal. No respiratory distress.     Breath sounds: Normal breath sounds. No wheezing or rales.  Abdominal:     General: Bowel sounds are normal. There is no distension.     Palpations: Abdomen is soft.     Tenderness: There is no abdominal tenderness. There is no rebound.  Skin:    General: Skin is warm and dry.  Neurological:     Mental Status: She is alert and oriented to person, place, and time.     Coordination: Coordination normal.     Vitals:   10/23/18 0808  BP: (!) 168/90  Pulse: 84  Temp: 99 F (37.2 C)  TempSrc: Oral  SpO2: 96%  Weight: 153 lb (69.4 kg)  Height: 5\' 2"  (1.575  m)    Assessment & Plan:

## 2018-10-23 NOTE — Assessment & Plan Note (Signed)
U/A consistent with infection. Rx cipro and diflucan. Advised to drink more fluids to help avoid recurrence and ask gyn when seeing them next week about vaginal thickness and she may get benefit from estrace cream if thinning.

## 2018-10-29 LAB — HM PAP SMEAR

## 2018-11-05 ENCOUNTER — Encounter: Payer: Self-pay | Admitting: Endocrinology

## 2018-11-05 ENCOUNTER — Ambulatory Visit: Payer: BLUE CROSS/BLUE SHIELD | Admitting: Endocrinology

## 2018-11-05 ENCOUNTER — Other Ambulatory Visit: Payer: Self-pay

## 2018-11-05 VITALS — BP 142/80 | HR 103 | Ht 62.0 in | Wt 151.6 lb

## 2018-11-05 DIAGNOSIS — E1139 Type 2 diabetes mellitus with other diabetic ophthalmic complication: Secondary | ICD-10-CM | POA: Diagnosis not present

## 2018-11-05 LAB — POCT GLYCOSYLATED HEMOGLOBIN (HGB A1C): Hemoglobin A1C: 7.5 % — AB (ref 4.0–5.6)

## 2018-11-05 LAB — BASIC METABOLIC PANEL
BUN: 14 mg/dL (ref 6–23)
CO2: 29 mEq/L (ref 19–32)
Calcium: 9.5 mg/dL (ref 8.4–10.5)
Chloride: 102 mEq/L (ref 96–112)
Creatinine, Ser: 0.63 mg/dL (ref 0.40–1.20)
GFR: 115.04 mL/min (ref >60.00)
Glucose, Bld: 152 mg/dL — ABNORMAL HIGH (ref 70–99)
Potassium: 3.9 mEq/L (ref 3.5–5.1)
Sodium: 139 mEq/L (ref 135–145)

## 2018-11-05 NOTE — Patient Instructions (Addendum)
Your blood pressure is high today.  Please see your primary care provider soon, to have it rechecked. Blood tests are requested for you today.  We'll let you know about the results.  Please continue the same diabetes medications.  Please come back for a follow-up appointment in 3 months.   check your blood sugar once a day.  vary the time of day when you check, between before the 3 meals, and at bedtime.  also check if you have symptoms of your blood sugar being too high or too low.  please keep a record of the readings and bring it to your next appointment here.  You can write it on any piece of paper.  please call us sooner if your blood sugar goes below 70, or if you have a lot of readings over 200.

## 2018-11-05 NOTE — Progress Notes (Signed)
Subjective:    Patient ID: Lindsay Henderson, female    DOB: 01-26-1955, 63 y.o.   MRN: [id number]  HPI Pt returns for f/u of diabetes mellitus: DM type: 2 Dx'ed: [id number] Complications: DR.   Therapy: 3 oral meds GDM: (1987 and 1992).   DKA: never Severe hypoglycemia: never.   Pancreatitis: never Other: she took insulin only for GDM; she could not afford invokana; she did not tolerate jardiance (yeast infection); repaglinide was changed to glimepiride, as she could not take TID; she did not tolerate bromocriptine (nausea); pioglitazone is contraindicated by macular edema.  Interval history: no cbg record, but states cbg's vary from 100-200.  pt states she feels well in general.  She says she never misses the medications.   Past Medical History:  Diagnosis Date  . Adenomatous colon polyp 11/2005  . Allergic rhinitis   . Allergy   . Anomalous atrioventricular excitation   . Anxiety    no per pt  . Asthma   . Diverticulosis of colon   . Gastritis   . GERD (gastroesophageal reflux disease)   . History of supraventricular tachycardia   . Hypercholesteremia   . Kidney stones   . Nephrolithiasis   . Renal cyst   . Somatic dysfunction   . Type II or unspecified type diabetes mellitus without mention of complication, not stated as uncontrolled   . Vitamin D deficiency     Past Surgical History:  Procedure Laterality Date  . CARDIAC ELECTROPHYSIOLOGY [place] AND ABLATION  2004  . COLONOSCOPY  2007  . TUBAL LIGATION      Social History   Socioeconomic History  . Marital status: Married    Spouse name: Not on file  . Number of children: 4  . Years of education: Not on file  . Highest education level: Not on file  Occupational History  . Occupation: [employer]  Social Needs  . Financial resource strain: Not on file  . Food insecurity    Worry: Not on file    Inability: Not on file  . Transportation needs    Medical: Not on file    Non-medical: Not on file   Tobacco Use  . Smoking status: Never Smoker  . Smokeless tobacco: Never Used  Substance and Sexual Activity  . Alcohol use: No  . Drug use: No  . Sexual activity: Not Currently  Lifestyle  . Physical activity    Days per week: Not on file    Minutes per session: Not on file  . Stress: Not on file  Relationships  . Social Herbalist on phone: Not on file    Gets together: Not on file    Attends religious service: Not on file    Active member of club or organization: Not on file    Attends meetings of clubs or organizations: Not on file    Relationship status: Not on file  . Intimate partner violence    Fear of current or ex partner: Not on file    Emotionally abused: Not on file    Physically abused: Not on file    Forced sexual activity: Not on file  Other Topics Concern  . Not on file  Social History Narrative  . Not on file    Current Outpatient Medications on File Prior to Visit  Medication Sig Dispense Refill  . beclomethasone (QVAR) 80 MCG/ACT inhaler Inhale 1 puff into the lungs 2 (two) times daily. 1 Inhaler 6  .  clotrimazole-betamethasone (LOTRISONE) cream Apply 1 application topically 2 (two) times daily. 45 g 2  . cyclobenzaprine (FLEXERIL) 10 MG tablet Take 1 tablet (10 mg total) by mouth 3 (three) times daily as needed for muscle spasms. 45 tablet 0  . esomeprazole (NEXIUM) 20 MG capsule Take 20 mg by mouth daily at 12 noon.    . fexofenadine ([name]) 180 MG tablet Take 180 mg by mouth daily.    . fluconazole (DIFLUCAN) 150 MG tablet TAKE 1 TABLET BY MOUTH EVERY 3 DAYS 3 tablet 0  . fluticasone (FLONASE) 50 MCG/ACT nasal spray Place 2 sprays into both nostrils daily.    Marland Kitchen glimepiride (AMARYL) 4 MG tablet Take 1 tablet (4 mg total) by mouth daily with breakfast. 90 tablet 3  . metFORMIN (GLUCOPHAGE) 500 MG tablet TAKE 2 TABLETS BY MOUTH TWICE DAILY WITH A MEAL 120 tablet 0  . ONGLYZA 5 MG TABS tablet Take 1 tablet by mouth once daily 60 tablet 0  .  PROAIR HFA 108 (90 Base) MCG/ACT inhaler INHALE 2 PUFFS INTO LUNGS EVERY 6 HOURS AS NEEDED 9 g 2   No current facility-administered medications on file prior to visit.     Allergies  Allergen Reactions  . Parlodel [Bromocriptine Mesylate] Nausea Only    nausea  . Peanut-Containing Drug Products Hives, Itching and Swelling  . Penicillins     REACTION: rash and sob  . Pioglitazone Diarrhea    tremor  . Sulfa Antibiotics Hives  . Zithromax [Azithromycin] Nausea And Vomiting    Family History  Problem Relation Age of Onset  . Diabetes Father   . Hypertension Father   . Hyperlipidemia Father   . Breast cancer Mother   . Esophageal cancer Neg Hx   . Stomach cancer Neg Hx   . Colon cancer Neg Hx   . Rectal cancer Neg Hx     BP (!) 142/80 (BP Location: Left Arm, Patient Position: Sitting, Cuff Size: Normal)   Pulse (!) 103   Ht 5\' 2"  (1.575 m)   Wt 151 lb 9.6 oz (68.8 kg)   SpO2 98%   BMI 27.73 kg/m   Review of Systems She denies hypoglycemia    Objective:   Physical Exam VITAL SIGNS:  See vs page GENERAL: no distress Pulses: dorsalis pedis intact bilat.   MSK: no deformity of the feet CV: no leg edema Skin:  no ulcer on the feet.  normal color and temp on the feet. Neuro: sensation is intact to touch on the feet Ext: multiple ingrown toenails, but no drainage/swell/erythema.   A1c=7.5%     Assessment & Plan:  HTN: is noted today Tachycardia: check TSH Type 2 DM, with DR: well-controlled   Patient Instructions  Your blood pressure is high today.  Please see your primary care provider soon, to have it rechecked. Blood tests are requested for you today.  We'll let you know about the results.  Please continue the same diabetes medications.  Please come back for a follow-up appointment in 3 months.   check your blood sugar once a day.  vary the time of day when you check, between before the 3 meals, and at bedtime.  also check if you have symptoms of your blood  sugar being too high or too low.  please keep a record of the readings and bring it to your next appointment here.  You can write it on any piece of paper.  please call us sooner if your blood sugar goes below  70, or if you have a lot of readings over 200.

## 2018-11-07 LAB — TSH: TSH: 0.95 u[IU]/mL (ref 0.35–4.50)

## 2018-11-18 ENCOUNTER — Other Ambulatory Visit: Payer: Self-pay | Admitting: Endocrinology

## 2018-12-19 ENCOUNTER — Other Ambulatory Visit (INDEPENDENT_AMBULATORY_CARE_PROVIDER_SITE_OTHER): Payer: BLUE CROSS/BLUE SHIELD

## 2018-12-19 ENCOUNTER — Ambulatory Visit (INDEPENDENT_AMBULATORY_CARE_PROVIDER_SITE_OTHER): Payer: BLUE CROSS/BLUE SHIELD | Admitting: Internal Medicine

## 2018-12-19 ENCOUNTER — Encounter: Payer: Self-pay | Admitting: Internal Medicine

## 2018-12-19 ENCOUNTER — Other Ambulatory Visit: Payer: Self-pay

## 2018-12-19 VITALS — BP 148/82 | HR 94 | Temp 97.9°F | Ht 62.0 in | Wt 151.0 lb

## 2018-12-19 DIAGNOSIS — E1139 Type 2 diabetes mellitus with other diabetic ophthalmic complication: Secondary | ICD-10-CM

## 2018-12-19 DIAGNOSIS — E559 Vitamin D deficiency, unspecified: Secondary | ICD-10-CM

## 2018-12-19 DIAGNOSIS — Z0000 Encounter for general adult medical examination without abnormal findings: Secondary | ICD-10-CM

## 2018-12-19 DIAGNOSIS — E1169 Type 2 diabetes mellitus with other specified complication: Secondary | ICD-10-CM

## 2018-12-19 DIAGNOSIS — J4520 Mild intermittent asthma, uncomplicated: Secondary | ICD-10-CM | POA: Diagnosis not present

## 2018-12-19 DIAGNOSIS — E785 Hyperlipidemia, unspecified: Secondary | ICD-10-CM

## 2018-12-19 LAB — CBC
HCT: 40.6 % (ref 36.0–46.0)
Hemoglobin: 13.4 g/dL (ref 12.0–15.0)
MCHC: 33.1 g/dL (ref 30.0–36.0)
MCV: 88.6 fl (ref 78.0–100.0)
Platelets: 279 10*3/uL (ref 150.0–400.0)
RBC: 4.58 Mil/uL (ref 3.87–5.11)
RDW: 13.7 % (ref 11.5–15.5)
WBC: 8.3 10*3/uL (ref 4.0–10.5)

## 2018-12-19 LAB — COMPREHENSIVE METABOLIC PANEL
ALT: 23 U/L (ref 0–35)
AST: 18 U/L (ref 0–37)
Albumin: 4.3 g/dL (ref 3.5–5.2)
Alkaline Phosphatase: 75 U/L (ref 39–117)
BUN: 10 mg/dL (ref 6–23)
CO2: 26 mEq/L (ref 19–32)
Calcium: 9.2 mg/dL (ref 8.4–10.5)
Chloride: 102 mEq/L (ref 96–112)
Creatinine, Ser: 0.7 mg/dL (ref 0.40–1.20)
GFR: 101.83 mL/min (ref >60.00)
Glucose, Bld: 188 mg/dL — ABNORMAL HIGH (ref 70–99)
Potassium: 3.9 mEq/L (ref 3.5–5.1)
Sodium: 139 mEq/L (ref 135–145)
Total Bilirubin: 0.4 mg/dL (ref 0.2–1.2)
Total Protein: 7.3 g/dL (ref 6.0–8.3)

## 2018-12-19 LAB — URINALYSIS, ROUTINE W REFLEX MICROSCOPIC
Bilirubin Urine: NEGATIVE
Ketones, ur: NEGATIVE
Leukocytes,Ua: NEGATIVE
Nitrite: NEGATIVE
Specific Gravity, Urine: 1.025 (ref 1.000–1.030)
Total Protein, Urine: NEGATIVE
Urine Glucose: NEGATIVE
Urobilinogen, UA: 0.2 (ref 0.0–1.0)
pH: 6 (ref 5.0–8.0)

## 2018-12-19 LAB — LIPID PANEL
Cholesterol: 240 mg/dL — ABNORMAL HIGH (ref 0–200)
HDL: 50.9 mg/dL (ref >39.00)
LDL Cholesterol: 170 mg/dL — ABNORMAL HIGH (ref 0–99)
NonHDL: 188.68
Total CHOL/HDL Ratio: 5
Triglycerides: 92 mg/dL (ref 0.0–149.0)
VLDL: 18.4 mg/dL (ref 0.0–40.0)

## 2018-12-19 LAB — VITAMIN D 25 HYDROXY (VIT D DEFICIENCY, FRACTURES): VITD: 29.27 ng/mL — ABNORMAL LOW (ref 30.00–100.00)

## 2018-12-19 LAB — TSH: TSH: 1.94 u[IU]/mL (ref 0.35–4.50)

## 2018-12-19 MED ORDER — ESTRADIOL 0.1 MG/GM VA CREA: 1.0000 | TOPICAL_CREAM | Freq: Every day | VAGINAL | 12 refills | Status: DC

## 2018-12-19 NOTE — Assessment & Plan Note (Signed)
Checking vitamin-D.

## 2018-12-19 NOTE — Progress Notes (Signed)
   Subjective:   Patient ID: Lindsay Henderson, female    DOB: 1955-10-23, 63 y.o.   MRN: [id number]  HPI The patient is a 63 YO female coming in for physical.   PMH, [facility], social history reviewed and updated.   Review of Systems  Constitutional: Negative.   HENT: Negative.   Eyes: Negative.   Respiratory: Negative for cough, chest tightness and shortness of breath.   Cardiovascular: Negative for chest pain, palpitations and leg swelling.  Gastrointestinal: Negative for abdominal distention, abdominal pain, constipation, diarrhea, nausea and vomiting.  Musculoskeletal: Negative.   Skin: Negative.   Neurological: Negative.   Psychiatric/Behavioral: Negative.     Objective:  Physical Exam Constitutional:      Appearance: She is well-developed.  HENT:     Head: Normocephalic and atraumatic.  Cardiovascular:     Rate and Rhythm: Normal rate and regular rhythm.  Pulmonary:     Effort: Pulmonary effort is normal. No respiratory distress.     Breath sounds: Normal breath sounds. No wheezing or rales.  Abdominal:     General: Bowel sounds are normal. There is no distension.     Palpations: Abdomen is soft.     Tenderness: There is no abdominal tenderness. There is no rebound.  Musculoskeletal:     Cervical back: Normal range of motion.  Skin:    General: Skin is warm and dry.  Neurological:     Mental Status: She is alert and oriented to person, place, and time.     Coordination: Coordination normal.     Vitals:   12/19/18 0801  BP: (!) 148/82  Pulse: 94  Temp: 97.9 F (36.6 C)  TempSrc: Oral  SpO2: 98%  Weight: 151 lb (68.5 kg)  Height: 5\' 2"  (1.575 m)    This visit occurred during the SARS-CoV-2 public health emergency.  Safety protocols were in place, including screening questions prior to the visit, additional usage of staff PPE, and extensive cleaning of exam room while observing appropriate contact time as indicated for disinfecting solutions.   Assessment &  Plan:

## 2018-12-19 NOTE — Assessment & Plan Note (Signed)
She has stopped crestor since last visit. Checking lipid panel and adjust as needed.

## 2018-12-19 NOTE — Assessment & Plan Note (Signed)
Stable no flare, qith qvar and albuterol prn. Takes flonase.

## 2018-12-19 NOTE — Assessment & Plan Note (Signed)
Controlled following with endo on metformin and amaryl and onglyza. Foot exam and eye exam up to date. Not on statin or ARB but microalbumin to creatinine up to date. Checking lipid panel.

## 2018-12-19 NOTE — Patient Instructions (Addendum)
We have sent in the cream to use for the vagina once a day for 1 month then go to twice a week.   Health Maintenance, Female Adopting a healthy lifestyle and getting preventive care are important in promoting health and wellness. Ask your health care provider about:  The right schedule for you to have regular tests and exams.  Things you can do on your own to prevent diseases and keep yourself healthy. What should I know about diet, weight, and exercise? Eat a healthy diet   Eat a diet that includes plenty of vegetables, fruits, low-fat dairy products, and lean protein.  Do not eat a lot of foods that are high in solid fats, added sugars, or sodium. Maintain a healthy weight Body mass index (BMI) is used to identify weight problems. It estimates body fat based on height and weight. Your health care provider can help determine your BMI and help you achieve or maintain a healthy weight. Get regular exercise Get regular exercise. This is one of the most important things you can do for your health. Most adults should:  Exercise for at least 150 minutes each week. The exercise should increase your heart rate and make you sweat (moderate-intensity exercise).  Do strengthening exercises at least twice a week. This is in addition to the moderate-intensity exercise.  Spend less time sitting. Even light physical activity can be beneficial. Watch cholesterol and blood lipids Have your blood tested for lipids and cholesterol at 64 years of age, then have this test every 5 years. Have your cholesterol levels checked more often if:  Your lipid or cholesterol levels are high.  You are older than 64 years of age.  You are at high risk for heart disease. What should I know about cancer screening? Depending on your health history and family history, you may need to have cancer screening at various ages. This may include screening for:  Breast cancer.  Cervical cancer.  Colorectal cancer.   Skin cancer.  Lung cancer. What should I know about heart disease, diabetes, and high blood pressure? Blood pressure and heart disease  High blood pressure causes heart disease and increases the risk of stroke. This is more likely to develop in people who have high blood pressure readings, are of African descent, or are overweight.  Have your blood pressure checked: ? Every 3-5 years if you are 36-74 years of age. ? Every year if you are 52 years old or older. Diabetes Have regular diabetes screenings. This checks your fasting blood sugar level. Have the screening done:  Once every three years after age 42 if you are at a normal weight and have a low risk for diabetes.  More often and at a younger age if you are overweight or have a high risk for diabetes. What should I know about preventing infection? Hepatitis B If you have a higher risk for hepatitis B, you should be screened for this virus. Talk with your health care provider to find out if you are at risk for hepatitis B infection. Hepatitis C Testing is recommended for:  Everyone born from [birth year] through 1966.  Anyone with known risk factors for hepatitis C. Sexually transmitted infections (STIs)  Get screened for STIs, including gonorrhea and chlamydia, if: ? You are sexually active and are younger than 64 years of age. ? You are older than 64 years of age and your health care provider tells you that you are at risk for this type of infection. ? Your  sexual activity has changed since you were last screened, and you are at increased risk for chlamydia or gonorrhea. Ask your health care provider if you are at risk.  Ask your health care provider about whether you are at high risk for HIV. Your health care provider may recommend a prescription medicine to help prevent HIV infection. If you choose to take medicine to prevent HIV, you should first get tested for HIV. You should then be tested every 3 months for as long as you are  taking the medicine. Pregnancy  If you are about to stop having your period (premenopausal) and you may become pregnant, seek counseling before you get pregnant.  Take 400 to 800 micrograms (mcg) of folic acid every day if you become pregnant.  Ask for birth control (contraception) if you want to prevent pregnancy. Osteoporosis and menopause Osteoporosis is a disease in which the bones lose minerals and strength with aging. This can result in bone fractures. If you are 110 years old or older, or if you are at risk for osteoporosis and fractures, ask your health care provider if you should:  Be screened for bone loss.  Take a calcium or vitamin D supplement to lower your risk of fractures.  Be given hormone replacement therapy (HRT) to treat symptoms of menopause. Follow these instructions at home: Lifestyle  Do not use any products that contain nicotine or tobacco, such as cigarettes, e-cigarettes, and chewing tobacco. If you need help quitting, ask your health care provider.  Do not use street drugs.  Do not share needles.  Ask your health care provider for help if you need support or information about quitting drugs. Alcohol use  Do not drink alcohol if: ? Your health care provider tells you not to drink. ? You are pregnant, may be pregnant, or are planning to become pregnant.  If you drink alcohol: ? Limit how much you use to 0-1 drink a day. ? Limit intake if you are breastfeeding.  Be aware of how much alcohol is in your drink. In the [place] one drink equals one 12 oz bottle of beer (355 mL), one 5 oz glass of wine (148 mL), or one 1 oz glass of hard liquor (44 mL). General instructions  Schedule regular health, dental, and eye exams.  Stay current with your vaccines.  Tell your health care provider if: ? You often feel depressed. ? You have ever been abused or do not feel safe at home. Summary  Adopting a healthy lifestyle and getting preventive care are important  in promoting health and wellness.  Follow your health care provider's instructions about healthy diet, exercising, and getting tested or screened for diseases.  Follow your health care provider's instructions on monitoring your cholesterol and blood pressure. This information is not intended to replace advice given to you by your health care provider. Make sure you discuss any questions you have with your health care provider. Document Released: 07/11/2010 Document Revised: 12/19/2017 Document Reviewed: 12/19/2017 Elsevier Patient Education  2020 [employer].

## 2018-12-19 NOTE — Assessment & Plan Note (Signed)
Flu shot declines. Pneumonia up to date. Shingrix counseled. Tetanus up to date. Colonoscopy up to date. Mammogram up to date, pap smear up to date. Counseled about sun safety and mole surveillance. Counseled about the dangers of distracted driving. Given 10 year screening recommendations.

## 2018-12-20 ENCOUNTER — Telehealth: Payer: Self-pay

## 2018-12-20 ENCOUNTER — Other Ambulatory Visit: Payer: Self-pay | Admitting: Internal Medicine

## 2018-12-20 MED ORDER — LIVALO 1 MG PO TABS: 1.0000 mg | ORAL_TABLET | Freq: Every day | ORAL | 3 refills | Status: DC

## 2018-12-20 NOTE — Telephone Encounter (Signed)
Copied from [place] [id number]. Topic: General - Call Back - No Documentation >> Dec 19, 2018  4:59 PM Lindsay Henderson wrote: Reason for CRM:  Pt states she is returning a call

## 2018-12-20 NOTE — Telephone Encounter (Signed)
Taken care of in results notes

## 2018-12-23 ENCOUNTER — Telehealth: Payer: Self-pay | Admitting: Internal Medicine

## 2018-12-23 ENCOUNTER — Ambulatory Visit: Payer: BLUE CROSS/BLUE SHIELD | Admitting: Internal Medicine

## 2018-12-23 NOTE — Telephone Encounter (Signed)
Pt called and asked if she can go back to taking rosuvastatin (CRESTOR) 5 MG tablet  She was advised by the pharmacy that her insurance would not cover Rx for Pitavastatin Calcium (LIVALO) 1 MG TABS  / please advise

## 2018-12-23 NOTE — Telephone Encounter (Signed)
Waiting results of the PA I have done for this medication

## 2018-12-23 NOTE — Telephone Encounter (Signed)
PA started on CoverMyMeds KEY: [id number]  Approved 12/20/2018-12/18/2021  54 dollars for 90 days   Patient informed of proce and will pick it up and try it

## 2018-12-25 ENCOUNTER — Encounter: Payer: Self-pay | Admitting: Internal Medicine

## 2018-12-25 NOTE — Progress Notes (Signed)
Abstracted and sent to scan  

## 2019-01-08 ENCOUNTER — Other Ambulatory Visit: Payer: Self-pay | Admitting: Endocrinology

## 2019-01-08 LAB — HM MAMMOGRAPHY

## 2019-01-08 LAB — HM DEXA SCAN: HM Dexa Scan: -2

## 2019-02-07 ENCOUNTER — Other Ambulatory Visit: Payer: Self-pay

## 2019-02-11 ENCOUNTER — Ambulatory Visit: Payer: Self-pay | Admitting: Endocrinology

## 2019-02-11 ENCOUNTER — Encounter: Payer: Self-pay | Admitting: Endocrinology

## 2019-02-11 ENCOUNTER — Other Ambulatory Visit: Payer: Self-pay

## 2019-02-11 VITALS — BP 162/80 | HR 112 | Ht 62.0 in | Wt 150.0 lb

## 2019-02-11 DIAGNOSIS — E1139 Type 2 diabetes mellitus with other diabetic ophthalmic complication: Secondary | ICD-10-CM

## 2019-02-11 LAB — POCT GLYCOSYLATED HEMOGLOBIN (HGB A1C): Hemoglobin A1C: 7.9 % — AB (ref 4.0–5.6)

## 2019-02-11 MED ORDER — RYBELSUS 3 MG PO TABS: 3.0000 mg | ORAL_TABLET | ORAL | 11 refills | Status: DC

## 2019-02-11 NOTE — Progress Notes (Signed)
Subjective:    Patient ID: Lindsay Henderson, female    DOB: 1955/08/18, 64 y.o.   MRN: [id number]  HPI Pt returns for f/u of diabetes mellitus: DM type: 2 Dx'ed: [id number] Complications: DR.   Therapy: 3 oral meds GDM: (1987 and 1992).   DKA: never Severe hypoglycemia: never.   Pancreatitis: never Other: she took insulin only for GDM; she could not afford invokana; she did not tolerate jardiance (yeast infection); repaglinide was changed to glimepiride, as she could not take TID; she did not tolerate bromocriptine (nausea); pioglitazone is contraindicated by macular edema.  Interval history: no cbg record, but states cbg's vary from 75-300.  pt states she feels well in general.  She says she never misses the medications.  Past Medical History:  Diagnosis Date  . Adenomatous colon polyp 11/2005  . Allergic rhinitis   . Allergy   . Anomalous atrioventricular excitation   . Anxiety    no per pt  . Asthma   . Diverticulosis of colon   . Gastritis   . GERD (gastroesophageal reflux disease)   . History of supraventricular tachycardia   . Hypercholesteremia   . Kidney stones   . Nephrolithiasis   . Renal cyst   . Somatic dysfunction   . Type II or unspecified type diabetes mellitus without mention of complication, not stated as uncontrolled   . Vitamin D deficiency     Past Surgical History:  Procedure Laterality Date  . CARDIAC ELECTROPHYSIOLOGY [place] AND ABLATION  2004  . COLONOSCOPY  2007  . TUBAL LIGATION      Social History   Socioeconomic History  . Marital status: Married    Spouse name: Not on file  . Number of children: 4  . Years of education: Not on file  . Highest education level: Not on file  Occupational History  . Occupation: [employer]  Tobacco Use  . Smoking status: Never Smoker  . Smokeless tobacco: Never Used  Substance and Sexual Activity  . Alcohol use: No  . Drug use: No  . Sexual activity: Not Currently  Other Topics Concern  . Not  on file  Social History Narrative  . Not on file   Social Determinants of Health   Financial Resource Strain:   . Difficulty of Paying Living Expenses: Not on file  Food Insecurity:   . Worried About Charity fundraiser in the Last Year: Not on file  . Ran Out of Food in the Last Year: Not on file  Transportation Needs:   . Lack of Transportation (Medical): Not on file  . Lack of Transportation (Non-Medical): Not on file  Physical Activity:   . Days of Exercise per Week: Not on file  . Minutes of Exercise per Session: Not on file  Stress:   . Feeling of Stress : Not on file  Social Connections:   . Frequency of Communication with Friends and Family: Not on file  . Frequency of Social Gatherings with Friends and Family: Not on file  . Attends Religious Services: Not on file  . Active Member of Clubs or Organizations: Not on file  . Attends Archivist Meetings: Not on file  . Marital Status: Not on file  Intimate Partner Violence:   . Fear of Current or Ex-Partner: Not on file  . Emotionally Abused: Not on file  . Physically Abused: Not on file  . Sexually Abused: Not on file    Current Outpatient Medications on File Prior  to Visit  Medication Sig Dispense Refill  . beclomethasone (QVAR) 80 MCG/ACT inhaler Inhale 1 puff into the lungs 2 (two) times daily. 1 Inhaler 6  . clotrimazole-betamethasone (LOTRISONE) cream Apply 1 application topically 2 (two) times daily. 45 g 2  . cyclobenzaprine (FLEXERIL) 10 MG tablet Take 1 tablet (10 mg total) by mouth 3 (three) times daily as needed for muscle spasms. 45 tablet 0  . esomeprazole (NEXIUM) 20 MG capsule Take 20 mg by mouth daily at 12 noon.    Marland Kitchen estradiol (ESTRACE VAGINAL) 0.1 MG/GM vaginal cream Place 1 Applicatorful vaginally at bedtime. First month, then twice a week 42.5 g 12  . fexofenadine ([name]) 180 MG tablet Take 180 mg by mouth daily.    . fluticasone (FLONASE) 50 MCG/ACT nasal spray Place 2 sprays into both  nostrils daily.    Marland Kitchen glimepiride (AMARYL) 4 MG tablet Take 1 tablet (4 mg total) by mouth daily with breakfast. 90 tablet 3  . metFORMIN (GLUCOPHAGE) 500 MG tablet TAKE 2 TABLETS BY MOUTH TWICE DAILY WITH A MEAL 120 tablet 0  . Pitavastatin Calcium (LIVALO) 1 MG TABS Take 1 tablet (1 mg total) by mouth daily. 90 tablet 3  . PROAIR HFA 108 (90 Base) MCG/ACT inhaler INHALE 2 PUFFS INTO LUNGS EVERY 6 HOURS AS NEEDED 9 g 2   No current facility-administered medications on file prior to visit.    Allergies  Allergen Reactions  . Parlodel [Bromocriptine Mesylate] Nausea Only    nausea  . Peanut-Containing Drug Products Hives, Itching and Swelling  . Penicillins     REACTION: rash and sob  . Pioglitazone Diarrhea    tremor  . Sulfa Antibiotics Hives  . Zithromax [Azithromycin] Nausea And Vomiting    Family History  Problem Relation Age of Onset  . Diabetes Father   . Hypertension Father   . Hyperlipidemia Father   . Breast cancer Mother   . Esophageal cancer Neg Hx   . Stomach cancer Neg Hx   . Colon cancer Neg Hx   . Rectal cancer Neg Hx     BP (!) 162/80 (BP Location: Left Arm, Patient Position: Sitting, Cuff Size: Large)   Pulse (!) 112   Ht 5\' 2"  (1.575 m)   Wt 150 lb (68 kg)   SpO2 99%   BMI 27.44 kg/m    Review of Systems She denies hypoglycemia    Objective:   Physical Exam VITAL SIGNS:  See vs page GENERAL: no distress Pulses: dorsalis pedis intact bilat.   MSK: no deformity of the feet CV: no leg edema Skin:  no ulcer on the feet.  normal color and temp on the feet. Neuro: sensation is intact to touch on the feet Ext: several ingrown toenails.   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  HTN: is noted today.   Patient Instructions  Your blood pressure is high  today.  Please see your primary care provider soon, to have it rechecked. I have sent a prescription to your pharmacy, to change Onglyza to "Rybelsus." Please come back for a follow-up appointment in 2 months.   check your blood sugar once a day.  vary the time of day when you check, between before  the 3 meals, and at bedtime.  also check if you have symptoms of your blood sugar being too high or too low.  please keep a record of the readings and bring it to your next appointment here.  You can write it on any piece of paper.  please call us sooner if your blood sugar goes below 70, or if you have a lot of readings over 200.

## 2019-02-11 NOTE — Patient Instructions (Addendum)
Your blood pressure is high today.  Please see your primary care provider soon, to have it rechecked. I have sent a prescription to your pharmacy, to change Onglyza to "Rybelsus." Please come back for a follow-up appointment in 2 months.   check your blood sugar once a day.  vary the time of day when you check, between before the 3 meals, and at bedtime.  also check if you have symptoms of your blood sugar being too high or too low.  please keep a record of the readings and bring it to your next appointment here.  You can write it on any piece of paper.  please call us sooner if your blood sugar goes below 70, or if you have a lot of readings over 200.

## 2019-02-17 ENCOUNTER — Encounter: Payer: Self-pay | Admitting: Family

## 2019-02-17 ENCOUNTER — Ambulatory Visit: Payer: 59 | Admitting: Family

## 2019-02-17 ENCOUNTER — Other Ambulatory Visit: Payer: Self-pay

## 2019-02-17 VITALS — BP 140/70 | HR 84 | Temp 98.0°F | Ht 62.0 in | Wt 148.8 lb

## 2019-02-17 DIAGNOSIS — N390 Urinary tract infection, site not specified: Secondary | ICD-10-CM | POA: Diagnosis not present

## 2019-02-17 LAB — POC URINALSYSI DIPSTICK (AUTOMATED)
Bilirubin, UA: NEGATIVE
Blood, UA: NEGATIVE
Glucose, UA: POSITIVE — AB
Ketones, UA: NEGATIVE
Leukocytes, UA: NEGATIVE
Nitrite, UA: NEGATIVE
Protein, UA: POSITIVE — AB
Spec Grav, UA: >=1.03 — AB (ref 1.010–1.025)
Urobilinogen, UA: 0.2 E.U./dL
pH, UA: 5.5 (ref 5.0–8.0)

## 2019-02-17 MED ORDER — CIPROFLOXACIN HCL 500 MG PO TABS: 500.0000 mg | ORAL_TABLET | Freq: Two times a day (BID) | ORAL | 0 refills | Status: DC

## 2019-02-17 MED ORDER — FLUCONAZOLE 150 MG PO TABS: 150.0000 mg | ORAL_TABLET | Freq: Once | ORAL | 0 refills | Status: AC

## 2019-02-17 NOTE — Addendum Note (Signed)
Addended by: Marcina Millard on: 02/17/2019 02:10 PM   Modules accepted: Orders

## 2019-02-17 NOTE — Progress Notes (Signed)
Lindsay Henderson is a 64 y.o. female with the following history as recorded in EpicCare:  Patient Active Problem List   Diagnosis Date Noted  . Shoulder pain 04/18/2018  . Eye problem 08/17/2017  . Routine general medical examination at a health care facility 09/03/2015  . Diabetes ([place]) 05/29/2015  . Dysuria 10/30/2014  . Vitamin D deficiency 05/24/2009  . NEPHROLITHIASIS 10/23/2007  . SUPRAVENTRICULAR TACHYCARDIA 03/07/2007  . Asthma 03/07/2007  . Hyperlipidemia associated with type 2 diabetes mellitus ([place]) 12/15/2006  . Allergic rhinitis 12/15/2006  . GERD 12/15/2006    Current Outpatient Medications  Medication Sig Dispense Refill  . beclomethasone (QVAR) 80 MCG/ACT inhaler Inhale 1 puff into the lungs 2 (two) times daily. 1 Inhaler 6  . clotrimazole-betamethasone (LOTRISONE) cream Apply 1 application topically 2 (two) times daily. 45 g 2  . cyclobenzaprine (FLEXERIL) 10 MG tablet Take 1 tablet (10 mg total) by mouth 3 (three) times daily as needed for muscle spasms. 45 tablet 0  . esomeprazole (NEXIUM) 20 MG capsule Take 20 mg by mouth daily at 12 noon.    Marland Kitchen estradiol (ESTRACE VAGINAL) 0.1 MG/GM vaginal cream Place 1 Applicatorful vaginally at bedtime. First month, then twice a week 42.5 g 12  . fexofenadine ([name]) 180 MG tablet Take 180 mg by mouth daily.    . fluticasone (FLONASE) 50 MCG/ACT nasal spray Place 2 sprays into both nostrils daily.    Marland Kitchen glimepiride (AMARYL) 4 MG tablet Take 1 tablet (4 mg total) by mouth daily with breakfast. 90 tablet 3  . metFORMIN (GLUCOPHAGE) 500 MG tablet TAKE 2 TABLETS BY MOUTH TWICE DAILY WITH A MEAL 120 tablet 0  . Pitavastatin Calcium (LIVALO) 1 MG TABS Take 1 tablet (1 mg total) by mouth daily. 90 tablet 3  . PROAIR HFA 108 (90 Base) MCG/ACT inhaler INHALE 2 PUFFS INTO LUNGS EVERY 6 HOURS AS NEEDED 9 g 2  . Semaglutide (RYBELSUS) 3 MG TABS Take 3 mg by mouth every morning. 30 tablet 11  . ciprofloxacin (CIPRO) 500 MG tablet Take 1  tablet (500 mg total) by mouth 2 (two) times daily. 10 tablet 0  . fluconazole (DIFLUCAN) 150 MG tablet Take 1 tablet (150 mg total) by mouth once for 1 dose. Repeat after 72 hours as directed 2 tablet 0   No current facility-administered medications for this visit.    Allergies: Parlodel [bromocriptine mesylate], Peanut-containing drug products, Penicillins, Pioglitazone, Sulfa antibiotics, and Zithromax [azithromycin]  Past Medical History:  Diagnosis Date  . Adenomatous colon polyp 11/2005  . Allergic rhinitis   . Allergy   . Anomalous atrioventricular excitation   . Anxiety    no per pt  . Asthma   . Diverticulosis of colon   . Gastritis   . GERD (gastroesophageal reflux disease)   . History of supraventricular tachycardia   . Hypercholesteremia   . Kidney stones   . Nephrolithiasis   . Renal cyst   . Somatic dysfunction   . Type II or unspecified type diabetes mellitus without mention of complication, not stated as uncontrolled   . Vitamin D deficiency     Past Surgical History:  Procedure Laterality Date  . CARDIAC ELECTROPHYSIOLOGY [place] AND ABLATION  2004  . COLONOSCOPY  2007  . TUBAL LIGATION      Family History  Problem Relation Age of Onset  . Diabetes Father   . Hypertension Father   . Hyperlipidemia Father   . Breast cancer Mother   . Esophageal cancer Neg  Hx   . Stomach cancer Neg Hx   . Colon cancer Neg Hx   . Rectal cancer Neg Hx     Social History   Tobacco Use  . Smoking status: Never Smoker  . Smokeless tobacco: Never Used  Substance Use Topics  . Alcohol use: No    Subjective:  Presents with concerns for UTI; has had 2 other UTIs since August of last year; + burning, frequency x 5-6 days; no blood in urine; was started on topical estrogen last fall which has helped improve symptoms;  Requesting prescription for Cipro today;  Most recent Hgba1c at 7.9- in the process of changing from Onglyza to Rybelsus;  Objective:  Vitals:   02/17/19  1325  BP: 140/70  Pulse: 84  Temp: 98 F (36.7 C)  TempSrc: Oral  SpO2: 98%  Weight: 148 lb 12.8 oz (67.5 kg)  Height: 5\' 2"  (1.575 m)    General: Well developed, well nourished, in no acute distress  Skin : Warm and dry.  Head: Normocephalic and atraumatic  Lungs: Respirations unlabored; clear to auscultation bilaterally without wheeze, rales, rhonchi  CVS exam: normal rate and regular rhythm.  Neurologic: Alert and oriented; speech intact; face symmetrical; moves all extremities well; CNII-XII intact without focal deficit   Assessment:  1. Urinary tract infection without hematuria, site unspecified     Plan:  Check U/A and urine culture; Rx for Cipro and Diflucan; increase water; encouraged to work on gaining better control of diabetes-plan to go ahead and start Rybelsus after finishing these antibiotics;   This visit occurred during the SARS-CoV-2 public health emergency.  Safety protocols were in place, including screening questions prior to the visit, additional usage of staff PPE, and extensive cleaning of exam room while observing appropriate contact time as indicated for disinfecting solutions.     No follow-ups on file.  No orders of the defined types were placed in this encounter.   Requested Prescriptions   Signed Prescriptions Disp Refills  . ciprofloxacin (CIPRO) 500 MG tablet 10 tablet 0    Sig: Take 1 tablet (500 mg total) by mouth 2 (two) times daily.  . fluconazole (DIFLUCAN) 150 MG tablet 2 tablet 0    Sig: Take 1 tablet (150 mg total) by mouth once for 1 dose. Repeat after 72 hours as directed

## 2019-03-19 ENCOUNTER — Other Ambulatory Visit: Payer: Self-pay | Admitting: Endocrinology

## 2019-03-25 ENCOUNTER — Telehealth: Payer: Self-pay

## 2019-03-25 NOTE — Telephone Encounter (Signed)
New message    The patient has COVID vaccine on 3.18.21 please advise on health history.

## 2019-03-26 NOTE — Telephone Encounter (Signed)
There are no health concerns for covid-19 vaccine. Everyone should be encouraged to get covid-19 vaccine (unless if history of guillain barre then they should have visit with provider).

## 2019-03-26 NOTE — Telephone Encounter (Signed)
Pt advised, she verbalized understanding. 

## 2019-04-08 ENCOUNTER — Encounter: Payer: Self-pay | Admitting: Family

## 2019-04-08 ENCOUNTER — Other Ambulatory Visit: Payer: Self-pay

## 2019-04-08 ENCOUNTER — Ambulatory Visit: Payer: 59 | Admitting: Family

## 2019-04-08 VITALS — BP 124/68 | HR 85 | Temp 98.2°F | Ht 62.0 in | Wt 147.0 lb

## 2019-04-08 DIAGNOSIS — N390 Urinary tract infection, site not specified: Secondary | ICD-10-CM

## 2019-04-08 DIAGNOSIS — R300 Dysuria: Secondary | ICD-10-CM

## 2019-04-08 LAB — POC URINALSYSI DIPSTICK (AUTOMATED)
Blood, UA: NEGATIVE
Glucose, UA: NEGATIVE
Ketones, UA: NEGATIVE
Nitrite, UA: POSITIVE
Protein, UA: NEGATIVE
Spec Grav, UA: 1.015 (ref 1.010–1.025)
Urobilinogen, UA: 1 E.U./dL
pH, UA: 5.5 (ref 5.0–8.0)

## 2019-04-08 MED ORDER — CIPROFLOXACIN HCL 500 MG PO TABS: 500.0000 mg | ORAL_TABLET | Freq: Two times a day (BID) | ORAL | 0 refills | Status: DC

## 2019-04-08 MED ORDER — ESTRADIOL 0.1 MG/GM VA CREA: TOPICAL_CREAM | VAGINAL | 8 refills | Status: DC

## 2019-04-08 MED ORDER — FLUCONAZOLE 150 MG PO TABS: 150.0000 mg | ORAL_TABLET | Freq: Once | ORAL | 0 refills | Status: AC

## 2019-04-08 NOTE — Addendum Note (Signed)
Addended by: Marcina Millard on: 04/08/2019 04:52 PM   Modules accepted: Orders

## 2019-04-08 NOTE — Progress Notes (Signed)
Lindsay Henderson is a 64 y.o. female with the following history as recorded in EpicCare:  Patient Active Problem List   Diagnosis Date Noted  . Shoulder pain 04/18/2018  . Eye problem 08/17/2017  . Routine general medical examination at a health care facility 09/03/2015  . Diabetes ([place]) 05/29/2015  . Dysuria 10/30/2014  . Vitamin D deficiency 05/24/2009  . NEPHROLITHIASIS 10/23/2007  . SUPRAVENTRICULAR TACHYCARDIA 03/07/2007  . Asthma 03/07/2007  . Hyperlipidemia associated with type 2 diabetes mellitus ([place]) 12/15/2006  . Allergic rhinitis 12/15/2006  . GERD 12/15/2006    Current Outpatient Medications  Medication Sig Dispense Refill  . beclomethasone (QVAR) 80 MCG/ACT inhaler Inhale 1 puff into the lungs 2 (two) times daily. 1 Inhaler 6  . clotrimazole-betamethasone (LOTRISONE) cream Apply 1 application topically 2 (two) times daily. 45 g 2  . cyclobenzaprine (FLEXERIL) 10 MG tablet Take 1 tablet (10 mg total) by mouth 3 (three) times daily as needed for muscle spasms. 45 tablet 0  . esomeprazole (NEXIUM) 20 MG capsule Take 20 mg by mouth daily at 12 noon.    Marland Kitchen estradiol (ESTRACE VAGINAL) 0.1 MG/GM vaginal cream Use nightly as directed x 2 weeks; then use 2 x per week for maintenance 42.5 g 8  . fexofenadine ([name]) 180 MG tablet Take 180 mg by mouth daily.    . fluticasone (FLONASE) 50 MCG/ACT nasal spray Place 2 sprays into both nostrils daily.    Marland Kitchen glimepiride (AMARYL) 4 MG tablet Take 1 tablet (4 mg total) by mouth daily with breakfast. 90 tablet 3  . metFORMIN (GLUCOPHAGE) 500 MG tablet TAKE 2 TABLETS BY MOUTH TWICE DAILY WITH A MEAL 120 tablet 0  . Pitavastatin Calcium (LIVALO) 1 MG TABS Take 1 tablet (1 mg total) by mouth daily. 90 tablet 3  . PROAIR HFA 108 (90 Base) MCG/ACT inhaler INHALE 2 PUFFS INTO LUNGS EVERY 6 HOURS AS NEEDED 9 g 2  . Semaglutide (RYBELSUS) 3 MG TABS Take 3 mg by mouth every morning. 30 tablet 11  . ciprofloxacin (CIPRO) 500 MG tablet Take 1  tablet (500 mg total) by mouth 2 (two) times daily. 10 tablet 0  . fluconazole (DIFLUCAN) 150 MG tablet Take 1 tablet (150 mg total) by mouth once for 1 dose. Repeat after 72 hours 2 tablet 0   No current facility-administered medications for this visit.    Allergies: Parlodel [bromocriptine mesylate], Peanut-containing drug products, Penicillins, Pioglitazone, Sulfa antibiotics, and Zithromax [azithromycin]  Past Medical History:  Diagnosis Date  . Adenomatous colon polyp 11/2005  . Allergic rhinitis   . Allergy   . Anomalous atrioventricular excitation   . Anxiety    no per pt  . Asthma   . Diverticulosis of colon   . Gastritis   . GERD (gastroesophageal reflux disease)   . History of supraventricular tachycardia   . Hypercholesteremia   . Kidney stones   . Nephrolithiasis   . Renal cyst   . Somatic dysfunction   . Type II or unspecified type diabetes mellitus without mention of complication, not stated as uncontrolled   . Vitamin D deficiency     Past Surgical History:  Procedure Laterality Date  . CARDIAC ELECTROPHYSIOLOGY [place] AND ABLATION  2004  . COLONOSCOPY  2007  . TUBAL LIGATION      Family History  Problem Relation Age of Onset  . Diabetes Father   . Hypertension Father   . Hyperlipidemia Father   . Breast cancer Mother   . Esophageal cancer  Neg Hx   . Stomach cancer Neg Hx   . Colon cancer Neg Hx   . Rectal cancer Neg Hx     Social History   Tobacco Use  . Smoking status: Never Smoker  . Smokeless tobacco: Never Used  Substance Use Topics  . Alcohol use: No    Subjective:  Presents with concerns for UTI; does get these often; had been doing better with starting topical estrogen last December; did not understand she was supposed to continue and stopped taking in February; Blood sugars have been doing better with addition of Rybelsus;     Objective:  Vitals:   04/08/19 1137  BP: 124/68  Pulse: 85  Temp: 98.2 F (36.8 C)  TempSrc: Oral   SpO2: 95%  Weight: 147 lb (66.7 kg)  Height: 5\' 2"  (1.575 m)    General: Well developed, well nourished, in no acute distress  Skin : Warm and dry.  Head: Normocephalic and atraumatic  Lungs: Respirations unlabored;  Neurologic: Alert and oriented; speech intact; face symmetrical; moves all extremities well; CNII-XII intact without focal deficit   Assessment:  1. Dysuria   2. Recurrent UTI     Plan:  Check U/A and urine culture; patient requests Cipro; discussed benefit with topical estrogen in helping to prevent UTIs- she did not understand this and thought she was only supposed to use x 1 month; recommend to re-start and follow-up if symptoms persist; may need to consider urology referral if symptoms persist.  This visit occurred during the SARS-CoV-2 public health emergency.  Safety protocols were in place, including screening questions prior to the visit, additional usage of staff PPE, and extensive cleaning of exam room while observing appropriate contact time as indicated for disinfecting solutions.     No follow-ups on file.  Orders Placed This Encounter  Procedures  . Urine Culture    Requested Prescriptions   Signed Prescriptions Disp Refills  . estradiol (ESTRACE VAGINAL) 0.1 MG/GM vaginal cream 42.5 g 8    Sig: Use nightly as directed x 2 weeks; then use 2 x per week for maintenance  . ciprofloxacin (CIPRO) 500 MG tablet 10 tablet 0    Sig: Take 1 tablet (500 mg total) by mouth 2 (two) times daily.  . fluconazole (DIFLUCAN) 150 MG tablet 2 tablet 0    Sig: Take 1 tablet (150 mg total) by mouth once for 1 dose. Repeat after 72 hours

## 2019-04-10 LAB — URINE CULTURE: Result:: NO GROWTH

## 2019-04-15 ENCOUNTER — Ambulatory Visit: Payer: 59 | Admitting: Endocrinology

## 2019-04-15 ENCOUNTER — Other Ambulatory Visit: Payer: Self-pay

## 2019-04-15 ENCOUNTER — Encounter: Payer: Self-pay | Admitting: Endocrinology

## 2019-04-15 VITALS — BP 130/80 | HR 95 | Ht 62.0 in | Wt 148.0 lb

## 2019-04-15 DIAGNOSIS — E1139 Type 2 diabetes mellitus with other diabetic ophthalmic complication: Secondary | ICD-10-CM | POA: Diagnosis not present

## 2019-04-15 LAB — POCT GLYCOSYLATED HEMOGLOBIN (HGB A1C): Hemoglobin A1C: 6.9 % — AB (ref 4.0–5.6)

## 2019-04-15 MED ORDER — GLIMEPIRIDE 2 MG PO TABS: 2.0000 mg | ORAL_TABLET | Freq: Every day | ORAL | 11 refills | Status: DC

## 2019-04-15 NOTE — Progress Notes (Signed)
Subjective:    Patient ID: Lindsay Henderson, female    DOB: [birth year], 64 y.o.   MRN: [id number]  HPI Pt returns for f/u of diabetes mellitus: DM type: 2 Dx'ed: [id number] Complications: DR.   Therapy: 3 oral meds GDM: (1987 and 1992).   DKA: never Severe hypoglycemia: never.   Pancreatitis: never Other: she took insulin only for GDM; she could not afford invokana; she did not tolerate jardiance (yeast infection); repaglinide was changed to glimepiride, as she could not take TID; she did not tolerate bromocriptine (nausea); pioglitazone is contraindicated by macular edema.  Interval history: no cbg record, but states cbg's vary from 70-300.  pt states she feels well in general.  She says she never misses the medications.   Past Medical History:  Diagnosis Date  . Adenomatous colon polyp 11/2005  . Allergic rhinitis   . Allergy   . Anomalous atrioventricular excitation   . Anxiety    no per pt  . Asthma   . Diverticulosis of colon   . Gastritis   . GERD (gastroesophageal reflux disease)   . History of supraventricular tachycardia   . Hypercholesteremia   . Kidney stones   . Nephrolithiasis   . Renal cyst   . Somatic dysfunction   . Type II or unspecified type diabetes mellitus without mention of complication, not stated as uncontrolled   . Vitamin D deficiency     Past Surgical History:  Procedure Laterality Date  . CARDIAC ELECTROPHYSIOLOGY [place] AND ABLATION  2004  . COLONOSCOPY  2007  . TUBAL LIGATION      Social History   Socioeconomic History  . Marital status: Married    Spouse name: Not on file  . Number of children: 4  . Years of education: Not on file  . Highest education level: Not on file  Occupational History  . Occupation: [employer]  Tobacco Use  . Smoking status: Never Smoker  . Smokeless tobacco: Never Used  Substance and Sexual Activity  . Alcohol use: No  . Drug use: No  . Sexual activity: Not Currently  Other Topics Concern  .  Not on file  Social History Narrative  . Not on file   Social Determinants of Health   Financial Resource Strain:   . Difficulty of Paying Living Expenses:   Food Insecurity:   . Worried About Charity fundraiser in the Last Year:   . Arboriculturist in the Last Year:   Transportation Needs:   . Film/video editor (Medical):   Marland Kitchen Lack of Transportation (Non-Medical):   Physical Activity:   . Days of Exercise per Week:   . Minutes of Exercise per Session:   Stress:   . Feeling of Stress :   Social Connections:   . Frequency of Communication with Friends and Family:   . Frequency of Social Gatherings with Friends and Family:   . Attends Religious Services:   . Active Member of Clubs or Organizations:   . Attends Archivist Meetings:   Marland Kitchen Marital Status:   Intimate Partner Violence:   . Fear of Current or Ex-Partner:   . Emotionally Abused:   Marland Kitchen Physically Abused:   . Sexually Abused:     Current Outpatient Medications on File Prior to Visit  Medication Sig Dispense Refill  . beclomethasone (QVAR) 80 MCG/ACT inhaler Inhale 1 puff into the lungs 2 (two) times daily. 1 Inhaler 6  . ciprofloxacin (CIPRO) 500 MG tablet Take  1 tablet (500 mg total) by mouth 2 (two) times daily. 10 tablet 0  . clotrimazole-betamethasone (LOTRISONE) cream Apply 1 application topically 2 (two) times daily. 45 g 2  . cyclobenzaprine (FLEXERIL) 10 MG tablet Take 1 tablet (10 mg total) by mouth 3 (three) times daily as needed for muscle spasms. 45 tablet 0  . esomeprazole (NEXIUM) 20 MG capsule Take 20 mg by mouth daily at 12 noon.    Marland Kitchen estradiol (ESTRACE VAGINAL) 0.1 MG/GM vaginal cream Use nightly as directed x 2 weeks; then use 2 x per week for maintenance 42.5 g 8  . fexofenadine ([name]) 180 MG tablet Take 180 mg by mouth daily.    . fluticasone (FLONASE) 50 MCG/ACT nasal spray Place 2 sprays into both nostrils daily.    . metFORMIN (GLUCOPHAGE) 500 MG tablet TAKE 2 TABLETS BY MOUTH  TWICE DAILY WITH A MEAL 120 tablet 0  . Pitavastatin Calcium (LIVALO) 1 MG TABS Take 1 tablet (1 mg total) by mouth daily. 90 tablet 3  . PROAIR HFA 108 (90 Base) MCG/ACT inhaler INHALE 2 PUFFS INTO LUNGS EVERY 6 HOURS AS NEEDED 9 g 2  . Semaglutide (RYBELSUS) 3 MG TABS Take 3 mg by mouth every morning. 30 tablet 11   No current facility-administered medications on file prior to visit.    Allergies  Allergen Reactions  . Parlodel [Bromocriptine Mesylate] Nausea Only    nausea  . Peanut-Containing Drug Products Hives, Itching and Swelling  . Penicillins     REACTION: rash and sob  . Pioglitazone Diarrhea    tremor  . Sulfa Antibiotics Hives  . Zithromax [Azithromycin] Nausea And Vomiting    Family History  Problem Relation Age of Onset  . Diabetes Father   . Hypertension Father   . Hyperlipidemia Father   . Breast cancer Mother   . Esophageal cancer Neg Hx   . Stomach cancer Neg Hx   . Colon cancer Neg Hx   . Rectal cancer Neg Hx     BP 130/80   Pulse 95   Ht 5\' 2"  (1.575 m)   Wt 148 lb (67.1 kg)   SpO2 98%   BMI 27.07 kg/m    Review of Systems Denies LOC.      Objective:   Physical Exam VITAL SIGNS:  See vs page GENERAL: no distress Pulses: dorsalis pedis intact bilat.   MSK: no deformity of the feet CV: no leg edema Skin:  no ulcer on the feet.  normal color and temp on the feet. Neuro: sensation is intact to touch on the feet.     Lab Results  Component Value Date   HGBA1C 6.9 (A) 04/15/2019       Assessment & Plan:  Type 2 DM: overcontrolled, given this SU-containing regimen.  Hypoglycemia: this limits aggressiveness of glycemic control   Patient Instructions  I have sent a prescription to your pharmacy, to reduce the glimepiride, and: Please continue the same other medications.  Please come back for a follow-up appointment in 2 months.   check your blood sugar once a day.  vary the time of day when you check, between before the 3 meals, and  at bedtime.  also check if you have symptoms of your blood sugar being too high or too low.  please keep a record of the readings and bring it to your next appointment here.  You can write it on any piece of paper.  please call us sooner if your blood sugar goes  below 70, or if you have a lot of readings over 200.

## 2019-04-15 NOTE — Patient Instructions (Addendum)
I have sent a prescription to your pharmacy, to reduce the glimepiride, and: Please continue the same other medications.  Please come back for a follow-up appointment in 2 months.   check your blood sugar once a day.  vary the time of day when you check, between before the 3 meals, and at bedtime.  also check if you have symptoms of your blood sugar being too high or too low.  please keep a record of the readings and bring it to your next appointment here.  You can write it on any piece of paper.  please call us sooner if your blood sugar goes below 70, or if you have a lot of readings over 200.    

## 2019-06-05 ENCOUNTER — Other Ambulatory Visit: Payer: Self-pay

## 2019-06-05 DIAGNOSIS — E1139 Type 2 diabetes mellitus with other diabetic ophthalmic complication: Secondary | ICD-10-CM

## 2019-06-05 MED ORDER — METFORMIN HCL 500 MG PO TABS: 1000.0000 mg | ORAL_TABLET | Freq: Two times a day (BID) | ORAL | 0 refills | Status: DC

## 2019-06-26 ENCOUNTER — Other Ambulatory Visit: Payer: Self-pay | Admitting: Endocrinology

## 2019-06-26 DIAGNOSIS — E1139 Type 2 diabetes mellitus with other diabetic ophthalmic complication: Secondary | ICD-10-CM

## 2019-07-09 ENCOUNTER — Encounter: Payer: Self-pay | Admitting: Endocrinology

## 2019-07-09 ENCOUNTER — Ambulatory Visit: Payer: 59 | Admitting: Endocrinology

## 2019-07-09 ENCOUNTER — Other Ambulatory Visit: Payer: Self-pay

## 2019-07-09 VITALS — BP 130/70 | HR 103 | Ht 62.0 in | Wt 150.2 lb

## 2019-07-09 DIAGNOSIS — E1139 Type 2 diabetes mellitus with other diabetic ophthalmic complication: Secondary | ICD-10-CM

## 2019-07-09 LAB — POCT GLYCOSYLATED HEMOGLOBIN (HGB A1C): Hemoglobin A1C: 7 % — AB (ref 4.0–5.6)

## 2019-07-09 MED ORDER — GLIMEPIRIDE 1 MG PO TABS: 1.0000 mg | ORAL_TABLET | Freq: Every day | ORAL | 5 refills | Status: DC

## 2019-07-09 NOTE — Patient Instructions (Signed)
I have sent a prescription to your pharmacy, to reduce the glimepiride, and: Please continue the same other medications.  Please come back for a follow-up appointment in 2 months.   check your blood sugar once a day.  vary the time of day when you check, between before the 3 meals, and at bedtime.  also check if you have symptoms of your blood sugar being too high or too low.  please keep a record of the readings and bring it to your next appointment here.  You can write it on any piece of paper.  please call us sooner if your blood sugar goes below 70, or if you have a lot of readings over 200.

## 2019-07-09 NOTE — Progress Notes (Signed)
Subjective:    Patient ID: Lindsay Henderson, female    DOB: May 27, 1955, 64 y.o.   MRN: [id number]  HPI Pt returns for f/u of diabetes mellitus: DM type: 2 Dx'ed: 7654 Complications: DR.   Therapy: 3 oral meds GDM: (1987 and 1992).   DKA: never Severe hypoglycemia: never.   Pancreatitis: never Other: she took insulin only for GDM; she could not afford invokana; she did not tolerate jardiance (yeast infection); repaglinide was changed to glimepiride, as she could not take TID; she did not tolerate bromocriptine (nausea); pioglitazone is contraindicated by macular edema.  Interval history: no cbg record, but states cbg's vary from 100-200.  pt states she feels well in general.  She says she seldom misses the medications.   Past Medical History:  Diagnosis Date  . Adenomatous colon polyp 11/2005  . Allergic rhinitis   . Allergy   . Anomalous atrioventricular excitation   . Anxiety    no per pt  . Asthma   . Diverticulosis of colon   . Gastritis   . GERD (gastroesophageal reflux disease)   . History of supraventricular tachycardia   . Hypercholesteremia   . Kidney stones   . Nephrolithiasis   . Renal cyst   . Somatic dysfunction   . Type II or unspecified type diabetes mellitus without mention of complication, not stated as uncontrolled   . Vitamin D deficiency     Past Surgical History:  Procedure Laterality Date  . CARDIAC ELECTROPHYSIOLOGY [place] AND ABLATION  2004  . COLONOSCOPY  2007  . TUBAL LIGATION      Social History   Socioeconomic History  . Marital status: Married    Spouse name: Not on file  . Number of children: 4  . Years of education: Not on file  . Highest education level: Not on file  Occupational History  . Occupation: [employer]  Tobacco Use  . Smoking status: Never Smoker  . Smokeless tobacco: Never Used  Substance and Sexual Activity  . Alcohol use: No  . Drug use: No  . Sexual activity: Not Currently  Other Topics Concern  .  Not on file  Social History Narrative  . Not on file   Social Determinants of Health   Financial Resource Strain:   . Difficulty of Paying Living Expenses:   Food Insecurity:   . Worried About Charity fundraiser in the Last Year:   . Arboriculturist in the Last Year:   Transportation Needs:   . Film/video editor (Medical):   Marland Kitchen Lack of Transportation (Non-Medical):   Physical Activity:   . Days of Exercise per Week:   . Minutes of Exercise per Session:   Stress:   . Feeling of Stress :   Social Connections:   . Frequency of Communication with Friends and Family:   . Frequency of Social Gatherings with Friends and Family:   . Attends Religious Services:   . Active Member of Clubs or Organizations:   . Attends Archivist Meetings:   Marland Kitchen Marital Status:   Intimate Partner Violence:   . Fear of Current or Ex-Partner:   . Emotionally Abused:   Marland Kitchen Physically Abused:   . Sexually Abused:     Current Outpatient Medications on File Prior to Visit  Medication Sig Dispense Refill  . beclomethasone (QVAR) 80 MCG/ACT inhaler Inhale 1 puff into the lungs 2 (two) times daily. 1 Inhaler 6  . clotrimazole-betamethasone (LOTRISONE) cream Apply 1 application  topically 2 (two) times daily. 45 g 2  . cyclobenzaprine (FLEXERIL) 10 MG tablet Take 1 tablet (10 mg total) by mouth 3 (three) times daily as needed for muscle spasms. 45 tablet 0  . esomeprazole (NEXIUM) 20 MG capsule Take 20 mg by mouth daily at 12 noon.    Marland Kitchen estradiol (ESTRACE VAGINAL) 0.1 MG/GM vaginal cream Use nightly as directed x 2 weeks; then use 2 x per week for maintenance 42.5 g 8  . fexofenadine ([name]) 180 MG tablet Take 180 mg by mouth daily.    . fluticasone (FLONASE) 50 MCG/ACT nasal spray Place 2 sprays into both nostrils daily.    . metFORMIN (GLUCOPHAGE) 500 MG tablet TAKE 2 TABLETS BY MOUTH TWICE DAILY WITH A MEAL 120 tablet 0  . Pitavastatin Calcium (LIVALO) 1 MG TABS Take 1 tablet (1 mg total) by  mouth daily. 90 tablet 3  . PROAIR HFA 108 (90 Base) MCG/ACT inhaler INHALE 2 PUFFS INTO LUNGS EVERY 6 HOURS AS NEEDED 9 g 2  . Semaglutide (RYBELSUS) 3 MG TABS Take 3 mg by mouth every morning. 30 tablet 11   No current facility-administered medications on file prior to visit.    Allergies  Allergen Reactions  . Parlodel [Bromocriptine Mesylate] Nausea Only    nausea  . Peanut-Containing Drug Products Hives, Itching and Swelling  . Penicillins     REACTION: rash and sob  . Pioglitazone Diarrhea    tremor  . Sulfa Antibiotics Hives  . Zithromax [Azithromycin] Nausea And Vomiting    Family History  Problem Relation Age of Onset  . Diabetes Father   . Hypertension Father   . Hyperlipidemia Father   . Breast cancer Mother   . Esophageal cancer Neg Hx   . Stomach cancer Neg Hx   . Colon cancer Neg Hx   . Rectal cancer Neg Hx     BP 130/70   Pulse (!) 103   Ht 5\' 2"  (1.575 m)   Wt 150 lb 3.2 oz (68.1 kg)   SpO2 95%   BMI 27.47 kg/m    Review of Systems She denies hypoglycemia    Objective:   Physical Exam VITAL SIGNS:  See vs page GENERAL: no distress Pulses: dorsalis pedis intact bilat.   MSK: no deformity of the feet CV: no leg edema Skin:  no ulcer on the feet.  normal color and temp on the feet. Neuro: sensation is intact to touch on the feet.   Ext: both great toenails are ingrown.   Lab Results  Component Value Date   TSH 1.94 12/19/2018   Lab Results  Component Value Date   CREATININE 0.70 12/19/2018   BUN 10 12/19/2018   NA 139 12/19/2018   K 3.9 12/19/2018   CL 102 12/19/2018   CO2 26 12/19/2018     Lab Results  Component Value Date   HGBA1C 7.0 (A) 07/09/2019        Assessment & Plan:  Type 2 DM: overcontrolled, for this SU-containing regimen  Patient Instructions  I have sent a prescription to your pharmacy, to reduce the glimepiride, and: Please continue the same other medications.  Please come back for a follow-up appointment  in 2 months.   check your blood sugar once a day.  vary the time of day when you check, between before the 3 meals, and at bedtime.  also check if you have symptoms of your blood sugar being too high or too low.  please keep a record  of the readings and bring it to your next appointment here.  You can write it on any piece of paper.  please call us sooner if your blood sugar goes below 70, or if you have a lot of readings over 200.

## 2019-08-13 DIAGNOSIS — H35372 Puckering of macula, left eye: Secondary | ICD-10-CM | POA: Diagnosis not present

## 2019-08-13 DIAGNOSIS — E113513 Type 2 diabetes mellitus with proliferative diabetic retinopathy with macular edema, bilateral: Secondary | ICD-10-CM | POA: Diagnosis not present

## 2019-08-13 DIAGNOSIS — H43821 Vitreomacular adhesion, right eye: Secondary | ICD-10-CM | POA: Diagnosis not present

## 2019-08-27 ENCOUNTER — Other Ambulatory Visit: Payer: Self-pay | Admitting: Endocrinology

## 2019-08-27 DIAGNOSIS — E1139 Type 2 diabetes mellitus with other diabetic ophthalmic complication: Secondary | ICD-10-CM

## 2019-09-10 DIAGNOSIS — H43821 Vitreomacular adhesion, right eye: Secondary | ICD-10-CM | POA: Diagnosis not present

## 2019-09-10 DIAGNOSIS — H35372 Puckering of macula, left eye: Secondary | ICD-10-CM | POA: Diagnosis not present

## 2019-09-10 DIAGNOSIS — E113513 Type 2 diabetes mellitus with proliferative diabetic retinopathy with macular edema, bilateral: Secondary | ICD-10-CM | POA: Diagnosis not present

## 2019-09-11 ENCOUNTER — Ambulatory Visit: Payer: 59 | Admitting: Endocrinology

## 2019-09-23 ENCOUNTER — Encounter: Payer: Self-pay | Admitting: Endocrinology

## 2019-09-23 ENCOUNTER — Other Ambulatory Visit: Payer: Self-pay

## 2019-09-23 ENCOUNTER — Ambulatory Visit: Payer: PPO | Admitting: Endocrinology

## 2019-09-23 VITALS — BP 144/84 | HR 101 | Ht 62.0 in | Wt 148.0 lb

## 2019-09-23 DIAGNOSIS — E1139 Type 2 diabetes mellitus with other diabetic ophthalmic complication: Secondary | ICD-10-CM | POA: Diagnosis not present

## 2019-09-23 LAB — POCT GLYCOSYLATED HEMOGLOBIN (HGB A1C): Hemoglobin A1C: 7 % — AB (ref 4.0–5.6)

## 2019-09-23 MED ORDER — METFORMIN HCL ER 500 MG PO TB24: 2000.0000 mg | ORAL_TABLET | Freq: Every day | ORAL | 3 refills | Status: DC

## 2019-09-23 MED ORDER — GLIMEPIRIDE 1 MG PO TABS: 0.5000 mg | ORAL_TABLET | Freq: Every day | ORAL | 5 refills | Status: DC

## 2019-09-23 NOTE — Progress Notes (Signed)
Subjective:    Patient ID: Lindsay Henderson, female    DOB: 1955-09-25, 64 y.o.   MRN: [id number]  HPI Pt returns for f/u of diabetes mellitus: DM type: 2 Dx'ed: 0272 Complications: DR.   Therapy: 3 oral meds GDM: (1987 and 1992).   DKA: never Severe hypoglycemia: never.   Pancreatitis: never Other: she took insulin only for GDM; she could not afford invokana; she did not tolerate Jardiance (yeast infection); repaglinide was changed to glimepiride, as she could not take TID; she did not tolerate bromocriptine (nausea); pioglitazone is contraindicated by macular edema.  Interval history: no cbg record, but states cbg's vary from 65-230.  pt states she feels well in general.  She says she seldom misses the medications.  Past Medical History:  Diagnosis Date   Adenomatous colon polyp 11/2005   Allergic rhinitis    Allergy    Anomalous atrioventricular excitation    Anxiety    no per pt   Asthma    Diverticulosis of colon    Gastritis    GERD (gastroesophageal reflux disease)    History of supraventricular tachycardia    Hypercholesteremia    Kidney stones    Nephrolithiasis    Renal cyst    Somatic dysfunction    Type II or unspecified type diabetes mellitus without mention of complication, not stated as uncontrolled    Vitamin D deficiency     Past Surgical History:  Procedure Laterality Date   CARDIAC ELECTROPHYSIOLOGY [place] AND ABLATION  2004   COLONOSCOPY  2007   TUBAL LIGATION      Social History   Socioeconomic History   Marital status: Married    Spouse name: Not on file   Number of children: 4   Years of education: Not on file   Highest education level: Not on file  Occupational History   Occupation: [employer]  Tobacco Use   Smoking status: Never Smoker   Smokeless tobacco: Never Used  Substance and Sexual Activity   Alcohol use: No   Drug use: No   Sexual activity: Not Currently  Other Topics Concern    Not on file  Social History Narrative   Not on file   Social Determinants of Health   Financial Resource Strain:    Difficulty of Paying Living Expenses: Not on file  Food Insecurity:    Worried About Estate manager/land agent of Food in the Last Year: Not on file   [employer] of Food in the Last Year: Not on file  Transportation Needs:    Lack of Transportation (Medical): Not on file   Lack of Transportation (Non-Medical): Not on file  Physical Activity:    Days of Exercise per Week: Not on file   Minutes of Exercise per Session: Not on file  Stress:    Feeling of Stress : Not on file  Social Connections:    Frequency of Communication with Friends and Family: Not on file   Frequency of Social Gatherings with Friends and Family: Not on file   Attends Religious Services: Not on file   Active Member of Clubs or Organizations: Not on file   Attends Archivist Meetings: Not on file   Marital Status: Not on file  Intimate Partner Violence:    Fear of Current or Ex-Partner: Not on file   Emotionally Abused: Not on file   Physically Abused: Not on file   Sexually Abused: Not on file    Current Outpatient Medications on File Prior  to Visit  Medication Sig Dispense Refill   beclomethasone (QVAR) 80 MCG/ACT inhaler Inhale 1 puff into the lungs 2 (two) times daily. 1 Inhaler 6   clotrimazole-betamethasone (LOTRISONE) cream Apply 1 application topically 2 (two) times daily. 45 g 2   cyclobenzaprine (FLEXERIL) 10 MG tablet Take 1 tablet (10 mg total) by mouth 3 (three) times daily as needed for muscle spasms. 45 tablet 0   esomeprazole (NEXIUM) 20 MG capsule Take 20 mg by mouth daily at 12 noon.     estradiol (ESTRACE VAGINAL) 0.1 MG/GM vaginal cream Use nightly as directed x 2 weeks; then use 2 x per week for maintenance 42.5 g 8   fexofenadine ([name]) 180 MG tablet Take 180 mg by mouth daily.     fluticasone (FLONASE) 50 MCG/ACT nasal spray Place 2 sprays into both  nostrils daily.     Pitavastatin Calcium (LIVALO) 1 MG TABS Take 1 tablet (1 mg total) by mouth daily. 90 tablet 3   PROAIR HFA 108 (90 Base) MCG/ACT inhaler INHALE 2 PUFFS INTO LUNGS EVERY 6 HOURS AS NEEDED 9 g 2   Semaglutide (RYBELSUS) 3 MG TABS Take 3 mg by mouth every morning. 30 tablet 11   tobramycin (TOBREX) 0.3 % ophthalmic solution 1 drop 4 (four) times daily.     No current facility-administered medications on file prior to visit.    Allergies  Allergen Reactions   Parlodel [Bromocriptine Mesylate] Nausea Only    nausea   Peanut-Containing Drug Products Hives, Itching and Swelling   Penicillins     REACTION: rash and sob   Pioglitazone Diarrhea    tremor   Sulfa Antibiotics Hives   Zithromax [Azithromycin] Nausea And Vomiting    Family History  Problem Relation Age of Onset   Diabetes Father    Hypertension Father    Hyperlipidemia Father    Breast cancer Mother    Esophageal cancer Neg Hx    Stomach cancer Neg Hx    Colon cancer Neg Hx    Rectal cancer Neg Hx     BP (!) 144/84    Pulse (!) 101    Ht 5\' 2"  (1.575 m)    Wt 148 lb (67.1 kg)    SpO2 95%    BMI 27.07 kg/m    Review of Systems She has mild nausea    Objective:   Physical Exam VITAL SIGNS:  See vs page GENERAL: no distress Pulses: dorsalis pedis intact bilat.   MSK: no deformity of the feet CV: no leg edema Skin:  no ulcer on the feet.  normal color and temp on the feet. Neuro: sensation is intact to touch on the feet.    Lab Results  Component Value Date   HGBA1C 7.0 (A) 09/23/2019       Assessment & Plan:  HTN: is noted today Type 2 DM, with DR Hypoglycemia, due to glimepiride: this limits aggressiveness of glycemic control.   Patient Instructions  Your blood pressure is high today.  Please see your primary care provider soon, to have it rechecked. I have sent a prescription to your pharmacy, to reduce the glimepiride to 1/2 pill per day, and: Please continue  the same other medications.  Please come back for a follow-up appointment in 2-3 months.   check your blood sugar once a day.  vary the time of day when you check, between before the 3 meals, and at bedtime.  also check if you have symptoms of your blood sugar being  too high or too low.  please keep a record of the readings and bring it to your next appointment here.  You can write it on any piece of paper.  please call us sooner if your blood sugar goes below 70, or if you have a lot of readings over 200.

## 2019-09-23 NOTE — Patient Instructions (Addendum)
Your blood pressure is high today.  Please see your primary care provider soon, to have it rechecked. I have sent a prescription to your pharmacy, to reduce the glimepiride to 1/2 pill per day, and: Please continue the same other medications.  Please come back for a follow-up appointment in 2-3 months.   check your blood sugar once a day.  vary the time of day when you check, between before the 3 meals, and at bedtime.  also check if you have symptoms of your blood sugar being too high or too low.  please keep a record of the readings and bring it to your next appointment here.  You can write it on any piece of paper.  please call us sooner if your blood sugar goes below 70, or if you have a lot of readings over 200.

## 2019-10-31 DIAGNOSIS — H35372 Puckering of macula, left eye: Secondary | ICD-10-CM | POA: Diagnosis not present

## 2019-10-31 DIAGNOSIS — E113513 Type 2 diabetes mellitus with proliferative diabetic retinopathy with macular edema, bilateral: Secondary | ICD-10-CM | POA: Diagnosis not present

## 2019-10-31 DIAGNOSIS — H43821 Vitreomacular adhesion, right eye: Secondary | ICD-10-CM | POA: Diagnosis not present

## 2019-12-24 ENCOUNTER — Other Ambulatory Visit: Payer: Self-pay

## 2019-12-24 ENCOUNTER — Ambulatory Visit: Payer: PPO | Admitting: Endocrinology

## 2019-12-24 ENCOUNTER — Encounter: Payer: Self-pay | Admitting: Endocrinology

## 2019-12-24 VITALS — BP 140/100 | HR 91 | Ht 62.0 in | Wt 147.8 lb

## 2019-12-24 DIAGNOSIS — E1139 Type 2 diabetes mellitus with other diabetic ophthalmic complication: Secondary | ICD-10-CM

## 2019-12-24 LAB — POCT GLYCOSYLATED HEMOGLOBIN (HGB A1C): Hemoglobin A1C: 7.3 % — AB (ref 4.0–5.6)

## 2019-12-24 MED ORDER — RYBELSUS 3 MG PO TABS: 1.5000 mg | ORAL_TABLET | ORAL | 3 refills | Status: DC

## 2019-12-24 NOTE — Patient Instructions (Addendum)
Your blood pressure is high today.  Please see your primary care provider soon, to have it rechecked. Please stop taking the glimepiride, and: Please continue the same other medications.  Please come back for a follow-up appointment in 3 months.   check your blood sugar once a day.  vary the time of day when you check, between before the 3 meals, and at bedtime.  also check if you have symptoms of your blood sugar being too high or too low.  please keep a record of the readings and bring it to your next appointment here.  You can write it on any piece of paper.  please call us sooner if your blood sugar goes below 70, or if you have a lot of readings over 200.

## 2019-12-24 NOTE — Progress Notes (Signed)
Subjective:    Patient ID: Lindsay Henderson, female    DOB: 05-11-1955, 64 y.o.   MRN: [id number]  HPI Pt returns for f/u of diabetes mellitus:  DM type: 2 Dx'ed: 0737 Complications: DR.   Therapy: 3 oral meds GDM: (1987 and 1992).   DKA: never Severe hypoglycemia: never.   Pancreatitis: never Other: she took insulin only for GDM; she could not afford invokana; she did not tolerate Jardiance (yeast infection); repaglinide was changed to glimepiride, as she could not take TID; she did not tolerate bromocriptine (nausea); pioglitazone is contraindicated by macular edema.  Interval history: no cbg record, but states cbg's vary from 68-325.  pt states she feels well in general.  She says she seldom misses the medications. She takes Rybelsus QOD, due to nausea.   Past Medical History:  Diagnosis Date  . Adenomatous colon polyp 11/2005  . Allergic rhinitis   . Allergy   . Anomalous atrioventricular excitation   . Anxiety    no per pt  . Asthma   . Diverticulosis of colon   . Gastritis   . GERD (gastroesophageal reflux disease)   . History of supraventricular tachycardia   . Hypercholesteremia   . Kidney stones   . Nephrolithiasis   . Renal cyst   . Somatic dysfunction   . Type II or unspecified type diabetes mellitus without mention of complication, not stated as uncontrolled   . Vitamin D deficiency     Past Surgical History:  Procedure Laterality Date  . CARDIAC ELECTROPHYSIOLOGY [place] AND ABLATION  2004  . COLONOSCOPY  2007  . TUBAL LIGATION      Social History   Socioeconomic History  . Marital status: Married    Spouse name: Not on file  . Number of children: 4  . Years of education: Not on file  . Highest education level: Not on file  Occupational History  . Occupation: [employer]  Tobacco Use  . Smoking status: Never Smoker  . Smokeless tobacco: Never Used  Substance and Sexual Activity  . Alcohol use: No  . Drug use: No  . Sexual activity:  Not Currently  Other Topics Concern  . Not on file  Social History Narrative  . Not on file   Social Determinants of Health   Financial Resource Strain: Not on file  Food Insecurity: Not on file  Transportation Needs: Not on file  Physical Activity: Not on file  Stress: Not on file  Social Connections: Not on file  Intimate Partner Violence: Not on file    Current Outpatient Medications on File Prior to Visit  Medication Sig Dispense Refill  . beclomethasone (QVAR) 80 MCG/ACT inhaler Inhale 1 puff into the lungs 2 (two) times daily. 1 Inhaler 6  . clotrimazole-betamethasone (LOTRISONE) cream Apply 1 application topically 2 (two) times daily. 45 g 2  . cyclobenzaprine (FLEXERIL) 10 MG tablet Take 1 tablet (10 mg total) by mouth 3 (three) times daily as needed for muscle spasms. 45 tablet 0  . esomeprazole (NEXIUM) 20 MG capsule Take 20 mg by mouth daily at 12 noon.    Marland Kitchen estradiol (ESTRACE VAGINAL) 0.1 MG/GM vaginal cream Use nightly as directed x 2 weeks; then use 2 x per week for maintenance 42.5 g 8  . fexofenadine ([name]) 180 MG tablet Take 180 mg by mouth daily.    . fluticasone (FLONASE) 50 MCG/ACT nasal spray Place 2 sprays into both nostrils daily.    . metFORMIN (GLUCOPHAGE-XR) 500 MG 24  hr tablet Take 4 tablets (2,000 mg total) by mouth daily with breakfast. 360 tablet 3  . Pitavastatin Calcium (LIVALO) 1 MG TABS Take 1 tablet (1 mg total) by mouth daily. 90 tablet 3  . PROAIR HFA 108 (90 Base) MCG/ACT inhaler INHALE 2 PUFFS INTO LUNGS EVERY 6 HOURS AS NEEDED 9 g 2  . tobramycin (TOBREX) 0.3 % ophthalmic solution 1 drop 4 (four) times daily.     No current facility-administered medications on file prior to visit.    Allergies  Allergen Reactions  . Parlodel [Bromocriptine Mesylate] Nausea Only    nausea  . Peanut-Containing Drug Products Hives, Itching and Swelling  . Penicillins     REACTION: rash and sob  . Pioglitazone Diarrhea    tremor  . Sulfa Antibiotics  Hives  . Zithromax [Azithromycin] Nausea And Vomiting    Family History  Problem Relation Age of Onset  . Diabetes Father   . Hypertension Father   . Hyperlipidemia Father   . Breast cancer Mother   . Esophageal cancer Neg Hx   . Stomach cancer Neg Hx   . Colon cancer Neg Hx   . Rectal cancer Neg Hx     BP (!) 140/100   Pulse 91   Ht 5\' 2"  (1.575 m)   Wt 147 lb 12.8 oz (67 kg)   SpO2 99%   BMI 27.03 kg/m    Review of Systems     Objective:   Physical Exam VITAL SIGNS:  See vs page GENERAL: no distress Pulses: dorsalis pedis intact bilat.   MSK: no deformity of the feet CV: no leg edema Skin:  no ulcer on the feet.  normal color and temp on the feet.   Neuro: sensation is intact to touch on the feet.   EXT: both great toenails are ingrown, but no swell/erythema/drainage/tend  Lab Results  Component Value Date   HGBA1C 7.3 (A) 12/24/2019        Assessment & Plan:  HTN: is noted today Type 2 DM Hypoglycemia, due to glimepiride  Patient Instructions  Your blood pressure is high today.  Please see your primary care provider soon, to have it rechecked. Please stop taking the glimepiride, and: Please continue the same other medications.  Please come back for a follow-up appointment in 3 months.   check your blood sugar once a day.  vary the time of day when you check, between before the 3 meals, and at bedtime.  also check if you have symptoms of your blood sugar being too high or too low.  please keep a record of the readings and bring it to your next appointment here.  You can write it on any piece of paper.  please call us sooner if your blood sugar goes below 70, or if you have a lot of readings over 200.

## 2019-12-30 DIAGNOSIS — H2513 Age-related nuclear cataract, bilateral: Secondary | ICD-10-CM | POA: Diagnosis not present

## 2019-12-30 DIAGNOSIS — E113513 Type 2 diabetes mellitus with proliferative diabetic retinopathy with macular edema, bilateral: Secondary | ICD-10-CM | POA: Diagnosis not present

## 2019-12-30 DIAGNOSIS — H35372 Puckering of macula, left eye: Secondary | ICD-10-CM | POA: Diagnosis not present

## 2020-01-08 DIAGNOSIS — Z1231 Encounter for screening mammogram for malignant neoplasm of breast: Secondary | ICD-10-CM | POA: Diagnosis not present

## 2020-01-21 ENCOUNTER — Encounter: Payer: PPO | Admitting: Internal Medicine

## 2020-02-09 ENCOUNTER — Other Ambulatory Visit: Payer: Self-pay

## 2020-02-10 ENCOUNTER — Encounter: Payer: Self-pay | Admitting: Internal Medicine

## 2020-02-10 ENCOUNTER — Ambulatory Visit (INDEPENDENT_AMBULATORY_CARE_PROVIDER_SITE_OTHER): Payer: PPO | Admitting: Internal Medicine

## 2020-02-10 ENCOUNTER — Other Ambulatory Visit: Payer: Self-pay | Admitting: Internal Medicine

## 2020-02-10 VITALS — BP 126/82 | HR 95 | Temp 98.2°F | Resp 18 | Ht 62.0 in | Wt 145.2 lb

## 2020-02-10 DIAGNOSIS — Z0000 Encounter for general adult medical examination without abnormal findings: Secondary | ICD-10-CM | POA: Diagnosis not present

## 2020-02-10 DIAGNOSIS — J4520 Mild intermittent asthma, uncomplicated: Secondary | ICD-10-CM | POA: Diagnosis not present

## 2020-02-10 DIAGNOSIS — K219 Gastro-esophageal reflux disease without esophagitis: Secondary | ICD-10-CM | POA: Diagnosis not present

## 2020-02-10 DIAGNOSIS — E1139 Type 2 diabetes mellitus with other diabetic ophthalmic complication: Secondary | ICD-10-CM | POA: Diagnosis not present

## 2020-02-10 DIAGNOSIS — E1169 Type 2 diabetes mellitus with other specified complication: Secondary | ICD-10-CM | POA: Diagnosis not present

## 2020-02-10 DIAGNOSIS — E785 Hyperlipidemia, unspecified: Secondary | ICD-10-CM

## 2020-02-10 LAB — COMPREHENSIVE METABOLIC PANEL
ALT: 19 U/L (ref 0–35)
AST: 17 U/L (ref 0–37)
Albumin: 4.4 g/dL (ref 3.5–5.2)
Alkaline Phosphatase: 76 U/L (ref 39–117)
BUN: 12 mg/dL (ref 6–23)
CO2: 28 mEq/L (ref 19–32)
Calcium: 9.5 mg/dL (ref 8.4–10.5)
Chloride: 101 mEq/L (ref 96–112)
Creatinine, Ser: 0.63 mg/dL (ref 0.40–1.20)
GFR: 93.06 mL/min (ref >60.00)
Glucose, Bld: 148 mg/dL — ABNORMAL HIGH (ref 70–99)
Potassium: 4 mEq/L (ref 3.5–5.1)
Sodium: 136 mEq/L (ref 135–145)
Total Bilirubin: 0.4 mg/dL (ref 0.2–1.2)
Total Protein: 7.4 g/dL (ref 6.0–8.3)

## 2020-02-10 LAB — CBC
HCT: 40.9 % (ref 36.0–46.0)
Hemoglobin: 13.7 g/dL (ref 12.0–15.0)
MCHC: 33.5 g/dL (ref 30.0–36.0)
MCV: 88.1 fl (ref 78.0–100.0)
Platelets: 307 10*3/uL (ref 150.0–400.0)
RBC: 4.64 Mil/uL (ref 3.87–5.11)
RDW: 13.7 % (ref 11.5–15.5)
WBC: 8.3 10*3/uL (ref 4.0–10.5)

## 2020-02-10 LAB — LIPID PANEL
Cholesterol: 244 mg/dL — ABNORMAL HIGH (ref 0–200)
HDL: 54.9 mg/dL (ref >39.00)
LDL Cholesterol: 167 mg/dL — ABNORMAL HIGH (ref 0–99)
NonHDL: 189
Total CHOL/HDL Ratio: 4
Triglycerides: 110 mg/dL (ref 0.0–149.0)
VLDL: 22 mg/dL (ref 0.0–40.0)

## 2020-02-10 LAB — MICROALBUMIN / CREATININE URINE RATIO
Creatinine,U: 37.8 mg/dL
Microalb Creat Ratio: 1.9 mg/g (ref 0.0–30.0)
Microalb, Ur: <0.7 mg/dL (ref 0.0–1.9)

## 2020-02-10 LAB — URINALYSIS, ROUTINE W REFLEX MICROSCOPIC
Bilirubin Urine: NEGATIVE
Hgb urine dipstick: NEGATIVE
Ketones, ur: NEGATIVE
Leukocytes,Ua: NEGATIVE
Nitrite: NEGATIVE
RBC / HPF: NONE SEEN (ref 0–?)
Specific Gravity, Urine: 1.015 (ref 1.000–1.030)
Total Protein, Urine: NEGATIVE
Urine Glucose: NEGATIVE
Urobilinogen, UA: 0.2 (ref 0.0–1.0)
pH: 5.5 (ref 5.0–8.0)

## 2020-02-10 MED ORDER — ESTRADIOL 0.1 MG/GM VA CREA: TOPICAL_CREAM | VAGINAL | 11 refills | Status: DC

## 2020-02-10 MED ORDER — PITAVASTATIN CALCIUM 2 MG PO TABS: 2.0000 mg | ORAL_TABLET | Freq: Every day | ORAL | 3 refills | Status: DC

## 2020-02-10 MED ORDER — PROAIR HFA 108 (90 BASE) MCG/ACT IN AERS: INHALATION_SPRAY | RESPIRATORY_TRACT | 2 refills | Status: DC

## 2020-02-10 NOTE — Assessment & Plan Note (Signed)
Taking nexium 20 mg daily and well controlled. Will continue.

## 2020-02-10 NOTE — Patient Instructions (Addendum)
Think about the pneumonia and shingles vaccine.  Think about doing a health care power of attorney to make sure the right person is making the decision.   Health Maintenance, Female Adopting a healthy lifestyle and getting preventive care are important in promoting health and wellness. Ask your health care provider about:  The right schedule for you to have regular tests and exams.  Things you can do on your own to prevent diseases and keep yourself healthy. What should I know about diet, weight, and exercise? Eat a healthy diet  Eat a diet that includes plenty of vegetables, fruits, low-fat dairy products, and lean protein.  Do not eat a lot of foods that are high in solid fats, added sugars, or sodium.   Maintain a healthy weight Body mass index (BMI) is used to identify weight problems. It estimates body fat based on height and weight. Your health care provider can help determine your BMI and help you achieve or maintain a healthy weight. Get regular exercise Get regular exercise. This is one of the most important things you can do for your health. Most adults should:  Exercise for at least 150 minutes each week. The exercise should increase your heart rate and make you sweat (moderate-intensity exercise).  Do strengthening exercises at least twice a week. This is in addition to the moderate-intensity exercise.  Spend less time sitting. Even light physical activity can be beneficial. Watch cholesterol and blood lipids Have your blood tested for lipids and cholesterol at 66 years of age, then have this test every 5 years. Have your cholesterol levels checked more often if:  Your lipid or cholesterol levels are high.  You are older than 66 years of age.  You are at high risk for heart disease. What should I know about cancer screening? Depending on your health history and family history, you may need to have cancer screening at various ages. This may include screening  for:  Breast cancer.  Cervical cancer.  Colorectal cancer.  Skin cancer.  Lung cancer. What should I know about heart disease, diabetes, and high blood pressure? Blood pressure and heart disease  High blood pressure causes heart disease and increases the risk of stroke. This is more likely to develop in people who have high blood pressure readings, are of African descent, or are overweight.  Have your blood pressure checked: ? Every 3-5 years if you are 66-66 years of age. ? Every year if you are 66 years old or older. Diabetes Have regular diabetes screenings. This checks your fasting blood sugar level. Have the screening done:  Once every three years after age 84 if you are at a normal weight and have a low risk for diabetes.  More often and at a younger age if you are overweight or have a high risk for diabetes. What should I know about preventing infection? Hepatitis B If you have a higher risk for hepatitis B, you should be screened for this virus. Talk with your health care provider to find out if you are at risk for hepatitis B infection. Hepatitis C Testing is recommended for:  Everyone born from [birth year] through 1966.  Anyone with known risk factors for hepatitis C. Sexually transmitted infections (STIs)  Get screened for STIs, including gonorrhea and chlamydia, if: ? You are sexually active and are younger than 66 years of age. ? You are older than 66 years of age and your health care provider tells you that you are at risk for this  type of infection. ? Your sexual activity has changed since you were last screened, and you are at increased risk for chlamydia or gonorrhea. Ask your health care provider if you are at risk.  Ask your health care provider about whether you are at high risk for HIV. Your health care provider may recommend a prescription medicine to help prevent HIV infection. If you choose to take medicine to prevent HIV, you should first get tested for HIV.  You should then be tested every 3 months for as long as you are taking the medicine. Pregnancy  If you are about to stop having your period (premenopausal) and you may become pregnant, seek counseling before you get pregnant.  Take 400 to 800 micrograms (mcg) of folic acid every day if you become pregnant.  Ask for birth control (contraception) if you want to prevent pregnancy. Osteoporosis and menopause Osteoporosis is a disease in which the bones lose minerals and strength with aging. This can result in bone fractures. If you are 66 years old or older, or if you are at risk for osteoporosis and fractures, ask your health care provider if you should:  Be screened for bone loss.  Take a calcium or vitamin D supplement to lower your risk of fractures.  Be given hormone replacement therapy (HRT) to treat symptoms of menopause. Follow these instructions at home: Lifestyle  Do not use any products that contain nicotine or tobacco, such as cigarettes, e-cigarettes, and chewing tobacco. If you need help quitting, ask your health care provider.  Do not use street drugs.  Do not share needles.  Ask your health care provider for help if you need support or information about quitting drugs. Alcohol use  Do not drink alcohol if: ? Your health care provider tells you not to drink. ? You are pregnant, may be pregnant, or are planning to become pregnant.  If you drink alcohol: ? Limit how much you use to 0-1 drink a day. ? Limit intake if you are breastfeeding.  Be aware of how much alcohol is in your drink. In the [place] one drink equals one 12 oz bottle of beer (355 mL), one 5 oz glass of wine (148 mL), or one 1 oz glass of hard liquor (44 mL). General instructions  Schedule regular health, dental, and eye exams.  Stay current with your vaccines.  Tell your health care provider if: ? You often feel depressed. ? You have ever been abused or do not feel safe at  home. Summary  Adopting a healthy lifestyle and getting preventive care are important in promoting health and wellness.  Follow your health care provider's instructions about healthy diet, exercising, and getting tested or screened for diseases.  Follow your health care provider's instructions on monitoring your cholesterol and blood pressure. This information is not intended to replace advice given to you by your health care provider. Make sure you discuss any questions you have with your health care provider. Document Revised: 12/19/2017 Document Reviewed: 12/19/2017 Elsevier Patient Education  2021 [employer].

## 2020-02-10 NOTE — Assessment & Plan Note (Signed)
Doing albuterol prn only without flare. Will remove qvar from list as she has not used in several years. Taking allergy medication as well.

## 2020-02-10 NOTE — Assessment & Plan Note (Signed)
Not taking statin but did not have side effect with livalo. Checking lipid panel and she is willing to restart if levels are high.

## 2020-02-10 NOTE — Assessment & Plan Note (Signed)
Flu shot declines. Covid-19 2 shots but no booster which is recommended at today's visit. Pneumonia counseled declines today. Shingrix counseled declines today. Tetanus counseled declines. Colonoscopy due 2026. Mammogram due 2022, pap smear aged out future and dexa due 2025. Counseled about sun safety and mole surveillance. Counseled about the dangers of distracted driving. Given 10 year screening recommendations.

## 2020-02-10 NOTE — Progress Notes (Signed)
Subjective:   Patient ID: Lindsay Henderson, female    DOB: 05-Sep-1955, 65 y.o.   MRN: [id number]  HPI Here for welcome to medicare wellness and physical, no new complaints. Please see A/P for status and treatment of chronic medical problems.   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 The patient's medical and social history - reviewed today no changes 2. Their use of alcohol, tobacco or illicit drugs 3. Their current medications and supplements 4. The patient's functional ability including ADL's, fall risks, home safety risks and hearing or visual impairment. 5. Diet and physical activities 6. Evidence for depression or mood disorders 7. Care team reviewed and updated  Patient Care Team: Hoyt Koch, MD as PCP - General (Internal Medicine) Past Medical History:  Diagnosis Date  . Adenomatous colon polyp 11/2005  . Allergic rhinitis   . Allergy   . Anomalous atrioventricular excitation   . Anxiety    no per pt  . Asthma   . Diverticulosis of colon   . Gastritis   . GERD (gastroesophageal reflux disease)   . History of supraventricular tachycardia   . Hypercholesteremia   . Kidney stones   . Nephrolithiasis   .  Renal cyst   . Somatic dysfunction   . Type II or unspecified type diabetes mellitus without mention of complication, not stated as uncontrolled   . Vitamin D deficiency    Past Surgical History:  Procedure Laterality Date  . CARDIAC ELECTROPHYSIOLOGY [place] AND ABLATION  2004  . COLONOSCOPY  2007  . TUBAL LIGATION     Family History  Problem Relation Age of Onset  . Diabetes Father   . Hypertension Father   . Hyperlipidemia Father   . Breast cancer Mother   . Esophageal cancer Neg Hx   . Stomach cancer Neg Hx   . Colon cancer Neg Hx   . Rectal cancer Neg Hx    Review of Systems  Constitutional: Negative.   HENT: Negative.   Eyes: Negative.   Respiratory: Negative for cough, chest tightness and shortness of breath.   Cardiovascular: Negative for chest pain, palpitations and leg swelling.  Gastrointestinal: Negative for abdominal distention, abdominal pain, constipation, diarrhea, nausea and vomiting.  Musculoskeletal: Negative.   Skin: Negative.   Neurological: Negative.   Psychiatric/Behavioral: Negative.     Objective:  Physical Exam Constitutional:      Appearance: She is well-developed and well-nourished.  HENT:     Head: Normocephalic and atraumatic.  Eyes:     Extraocular Movements: EOM normal.  Cardiovascular:     Rate and Rhythm: Normal rate and regular  rhythm.  Pulmonary:     Effort: Pulmonary effort is normal. No respiratory distress.     Breath sounds: Normal breath sounds. No wheezing or rales.  Abdominal:     General: Bowel sounds are normal. There is no distension.     Palpations: Abdomen is soft.     Tenderness: There is no abdominal tenderness. There is no rebound.  Musculoskeletal:        General: No edema.     Cervical back: Normal range of motion.  Skin:    General: Skin is warm and dry.  Neurological:     Mental Status: She is alert and oriented to person, place, and time.     Coordination: Coordination normal.  Psychiatric:         Mood and Affect: Mood and affect normal.     Vitals:   02/10/20 0807  BP: 126/82  Pulse: 95  Resp: 18  Temp: 98.2 F (36.8 C)  TempSrc: Oral  SpO2: 97%  Weight: 145 lb 3.2 oz (65.9 kg)  Height: 5\' 2"  (1.575 m)   This visit occurred during the SARS-CoV-2 public health emergency.  Safety protocols were in place, including screening questions prior to the visit, additional usage of staff PPE, and extensive cleaning of exam room while observing appropriate contact time as indicated for disinfecting solutions.   Assessment & Plan:

## 2020-02-10 NOTE — Assessment & Plan Note (Signed)
Checking microalbumin to creatinine ratio. Adjust as needed. Not taking her statin recently. Seeing endo for control and recent HgA1c 7.3 at goal on rybelsus and metformin. She is not on ACE-I or ARB. Eye exam up to date.

## 2020-03-10 DIAGNOSIS — E113513 Type 2 diabetes mellitus with proliferative diabetic retinopathy with macular edema, bilateral: Secondary | ICD-10-CM | POA: Diagnosis not present

## 2020-03-10 DIAGNOSIS — H2513 Age-related nuclear cataract, bilateral: Secondary | ICD-10-CM | POA: Diagnosis not present

## 2020-03-10 DIAGNOSIS — H35372 Puckering of macula, left eye: Secondary | ICD-10-CM | POA: Diagnosis not present

## 2020-03-30 ENCOUNTER — Other Ambulatory Visit: Payer: Self-pay

## 2020-03-30 ENCOUNTER — Ambulatory Visit: Payer: PPO | Admitting: Endocrinology

## 2020-03-30 VITALS — BP 132/80 | HR 93 | Ht 62.0 in | Wt 144.8 lb

## 2020-03-30 DIAGNOSIS — E1139 Type 2 diabetes mellitus with other diabetic ophthalmic complication: Secondary | ICD-10-CM

## 2020-03-30 LAB — POCT GLYCOSYLATED HEMOGLOBIN (HGB A1C): Hemoglobin A1C: 7.3 % — AB (ref 4.0–5.6)

## 2020-03-30 MED ORDER — METFORMIN HCL ER 500 MG PO TB24: 2000.0000 mg | ORAL_TABLET | Freq: Every day | ORAL | 3 refills | Status: DC

## 2020-03-30 MED ORDER — RYBELSUS 3 MG PO TABS: 3.0000 mg | ORAL_TABLET | ORAL | 3 refills | Status: DC

## 2020-03-30 NOTE — Progress Notes (Signed)
Subjective:    Patient ID: Lindsay Henderson, female    DOB: 20-Aug-1955, 65 y.o.   MRN: [id number]  HPI Pt returns for f/u of diabetes mellitus:  DM type: 2 Dx'ed: 0354 Complications: DR.   Therapy: 3 oral meds GDM: (1987 and 1992).   DKA: never Severe hypoglycemia: never.   Pancreatitis: never Other: she took insulin only for GDM; she could not afford invokana; she did not tolerate Jardiance (yeast infection); repaglinide was changed to glimepiride, as she could not take TID; she did not tolerate bromocriptine (nausea); pioglitazone is contraindicated by macular edema.  Interval history: no cbg record, but states cbg's vary from 98-275.  It is in general higher as the day goes on.  She says she never misses the medications. Nausea is mild.   Past Medical History:  Diagnosis Date  . Adenomatous colon polyp 11/2005  . Allergic rhinitis   . Allergy   . Anomalous atrioventricular excitation   . Anxiety    no per pt  . Asthma   . Diverticulosis of colon   . Gastritis   . GERD (gastroesophageal reflux disease)   . History of supraventricular tachycardia   . Hypercholesteremia   . Kidney stones   . Nephrolithiasis   . Renal cyst   . Somatic dysfunction   . Type II or unspecified type diabetes mellitus without mention of complication, not stated as uncontrolled   . Vitamin D deficiency     Past Surgical History:  Procedure Laterality Date  . CARDIAC ELECTROPHYSIOLOGY [place] AND ABLATION  2004  . COLONOSCOPY  2007  . TUBAL LIGATION      Social History   Socioeconomic History  . Marital status: Married    Spouse name: Not on file  . Number of children: 4  . Years of education: Not on file  . Highest education level: Not on file  Occupational History  . Occupation: [employer]  Tobacco Use  . Smoking status: Never Smoker  . Smokeless tobacco: Never Used  Substance and Sexual Activity  . Alcohol use: No  . Drug use: No  . Sexual activity: Not Currently   Other Topics Concern  . Not on file  Social History Narrative  . Not on file   Social Determinants of Health   Financial Resource Strain: Not on file  Food Insecurity: Not on file  Transportation Needs: Not on file  Physical Activity: Not on file  Stress: Not on file  Social Connections: Not on file  Intimate Partner Violence: Not on file    Current Outpatient Medications on File Prior to Visit  Medication Sig Dispense Refill  . clotrimazole-betamethasone (LOTRISONE) cream Apply 1 application topically 2 (two) times daily. 45 g 2  . cyclobenzaprine (FLEXERIL) 10 MG tablet Take 1 tablet (10 mg total) by mouth 3 (three) times daily as needed for muscle spasms. 45 tablet 0  . esomeprazole (NEXIUM) 20 MG capsule Take 20 mg by mouth daily at 12 noon.    Marland Kitchen estradiol (ESTRACE VAGINAL) 0.1 MG/GM vaginal cream Use nightly as directed x 2 weeks; then use 2 x per week for maintenance 42.5 g 11  . fexofenadine ([name]) 180 MG tablet Take 180 mg by mouth daily.    . fluticasone (FLONASE) 50 MCG/ACT nasal spray Place 2 sprays into both nostrils daily.    . Pitavastatin Calcium 2 MG TABS Take 1 tablet (2 mg total) by mouth daily. 90 tablet 3  . PROAIR HFA 108 (90 Base) MCG/ACT inhaler  INHALE 2 PUFFS INTO LUNGS EVERY 6 HOURS AS NEEDED 9 g 2  . tobramycin (TOBREX) 0.3 % ophthalmic solution 1 drop 4 (four) times daily.     No current facility-administered medications on file prior to visit.    Allergies  Allergen Reactions  . Parlodel [Bromocriptine Mesylate] Nausea Only    nausea  . Peanut-Containing Drug Products Hives, Itching and Swelling  . Penicillins     REACTION: rash and sob  . Pioglitazone Diarrhea    tremor  . Sulfa Antibiotics Hives  . Zithromax [Azithromycin] Nausea And Vomiting    Family History  Problem Relation Age of Onset  . Diabetes Father   . Hypertension Father   . Hyperlipidemia Father   . Breast cancer Mother   . Esophageal cancer Neg Hx   . Stomach cancer  Neg Hx   . Colon cancer Neg Hx   . Rectal cancer Neg Hx     BP 132/80 (BP Location: Right Arm, Patient Position: Sitting, Cuff Size: Normal)   Pulse 93   Ht 5\' 2"  (1.575 m)   Wt 144 lb 12.8 oz (65.7 kg)   SpO2 95%   BMI 26.48 kg/m    Review of Systems     Objective:   Physical Exam VITAL SIGNS:  See vs page GENERAL: no distress Pulses: dorsalis pedis intact bilat.   MSK: no deformity of the feet CV: no leg edema Skin:  no ulcer on the feet.  normal color and temp on the feet. Neuro: sensation is intact to touch on the feet  Lab Results  Component Value Date   HGBA1C 7.3 (A) 03/30/2020       Assessment & Plan:  Type 2 DM: uncontrolled.  She declines to add another med Nausea, due to Rybelsus: we can't increase for now.  Patient Instructions  Please continue the same 3 diabetes medications.  Please come back for a follow-up appointment in 4 months.   check your blood sugar once a day.  vary the time of day when you check, between before the 3 meals, and at bedtime.  also check if you have symptoms of your blood sugar being too high or too low.  please keep a record of the readings and bring it to your next appointment here.  You can write it on any piece of paper.  please call us sooner if your blood sugar goes below 70, or if you have a lot of readings over 200.

## 2020-03-30 NOTE — Patient Instructions (Addendum)
Please continue the same 3 diabetes medications.  Please come back for a follow-up appointment in 4 months.   check your blood sugar once a day.  vary the time of day when you check, between before the 3 meals, and at bedtime.  also check if you have symptoms of your blood sugar being too high or too low.  please keep a record of the readings and bring it to your next appointment here.  You can write it on any piece of paper.  please call us sooner if your blood sugar goes below 70, or if you have a lot of readings over 200.

## 2020-05-25 DIAGNOSIS — H2513 Age-related nuclear cataract, bilateral: Secondary | ICD-10-CM | POA: Diagnosis not present

## 2020-05-25 DIAGNOSIS — E113513 Type 2 diabetes mellitus with proliferative diabetic retinopathy with macular edema, bilateral: Secondary | ICD-10-CM | POA: Diagnosis not present

## 2020-05-25 DIAGNOSIS — H35372 Puckering of macula, left eye: Secondary | ICD-10-CM | POA: Diagnosis not present

## 2020-07-27 ENCOUNTER — Telehealth: Payer: Self-pay | Admitting: General Practice

## 2020-07-27 NOTE — Telephone Encounter (Signed)
Left a message on voice mail for patient to contact our office to schedule New Patient appointment is September.

## 2020-07-30 ENCOUNTER — Ambulatory Visit: Payer: PPO | Admitting: Endocrinology

## 2020-08-17 DIAGNOSIS — H2513 Age-related nuclear cataract, bilateral: Secondary | ICD-10-CM | POA: Diagnosis not present

## 2020-08-17 DIAGNOSIS — E113513 Type 2 diabetes mellitus with proliferative diabetic retinopathy with macular edema, bilateral: Secondary | ICD-10-CM | POA: Diagnosis not present

## 2020-08-17 DIAGNOSIS — H35372 Puckering of macula, left eye: Secondary | ICD-10-CM | POA: Diagnosis not present

## 2020-08-26 ENCOUNTER — Other Ambulatory Visit: Payer: Self-pay

## 2020-08-26 ENCOUNTER — Ambulatory Visit (INDEPENDENT_AMBULATORY_CARE_PROVIDER_SITE_OTHER): Payer: PPO | Admitting: Endocrinology

## 2020-08-26 VITALS — BP 120/64 | HR 92 | Ht 62.0 in | Wt 142.0 lb

## 2020-08-26 DIAGNOSIS — E1139 Type 2 diabetes mellitus with other diabetic ophthalmic complication: Secondary | ICD-10-CM

## 2020-08-26 LAB — POCT GLYCOSYLATED HEMOGLOBIN (HGB A1C): Hemoglobin A1C: 8 % — AB (ref 4.0–5.6)

## 2020-08-26 MED ORDER — GLIMEPIRIDE 1 MG PO TABS: 0.5000 mg | ORAL_TABLET | Freq: Every day | ORAL | 5 refills | Status: DC

## 2020-08-26 NOTE — Progress Notes (Signed)
Subjective:    Patient ID: Lindsay Henderson, female    DOB: 1955/05/28, 65 y.o.   MRN: [id number]  HPI Pt returns for f/u of diabetes mellitus:  DM type: 2 Dx'ed: [id number] Complications: DR.   Therapy: 2 oral meds GDM: (1987 and 1992).   DKA: never Severe hypoglycemia: never.   Pancreatitis: never Other: she took insulin only for GDM; she could not afford invokana; she did not tolerate Jardiance (yeast infection); repaglinide was changed to glimepiride, as she could not take TID; she did not tolerate bromocriptine (nausea); pioglitazone is contraindicated by macular edema.  Interval history: no cbg record, but states cbg's are in the 100's  It is in general higher as the day goes on.  She says she never misses the medications.  She reduced rybelsus to 1/2 pill QOD, due to nausea.   Past Medical History:  Diagnosis Date   Adenomatous colon polyp 11/2005   Allergic rhinitis    Allergy    Anomalous atrioventricular excitation    Anxiety    no per pt   Asthma    Diverticulosis of colon    Gastritis    GERD (gastroesophageal reflux disease)    History of supraventricular tachycardia    Hypercholesteremia    Kidney stones    Nephrolithiasis    Renal cyst    Somatic dysfunction    Type II or unspecified type diabetes mellitus without mention of complication, not stated as uncontrolled    Vitamin D deficiency     Past Surgical History:  Procedure Laterality Date   CARDIAC ELECTROPHYSIOLOGY MAPPING AND ABLATION  2004   COLONOSCOPY  2007   TUBAL LIGATION      Social History   Socioeconomic History   Marital status: Married    Spouse name: Not on file   Number of children: 4   Years of education: Not on file   Highest education level: Not on file  Occupational History   Occupation: [employer]  Tobacco Use   Smoking status: Never   Smokeless tobacco: Never  Substance and Sexual Activity   Alcohol use: No   Drug use: No   Sexual activity: Not Currently  Other  Topics Concern   Not on file  Social History Narrative   Not on file   Social Determinants of Health   Financial Resource Strain: Not on file  Food Insecurity: Not on file  Transportation Needs: Not on file  Physical Activity: Not on file  Stress: Not on file  Social Connections: Not on file  Intimate Partner Violence: Not on file    Current Outpatient Medications on File Prior to Visit  Medication Sig Dispense Refill   clotrimazole-betamethasone (LOTRISONE) cream Apply 1 application topically 2 (two) times daily. 45 g 2   cyclobenzaprine (FLEXERIL) 10 MG tablet Take 1 tablet (10 mg total) by mouth 3 (three) times daily as needed for muscle spasms. 45 tablet 0   esomeprazole (NEXIUM) 20 MG capsule Take 20 mg by mouth daily at 12 noon.     estradiol (ESTRACE VAGINAL) 0.1 MG/GM vaginal cream Use nightly as directed x 2 weeks; then use 2 x per week for maintenance 42.5 g 11   fexofenadine ([name]) 180 MG tablet Take 180 mg by mouth daily.     fluticasone (FLONASE) 50 MCG/ACT nasal spray Place 2 sprays into both nostrils daily.     metFORMIN (GLUCOPHAGE-XR) 500 MG 24 hr tablet Take 4 tablets (2,000 mg total) by mouth daily with breakfast. 360  tablet 3   Pitavastatin Calcium 2 MG TABS Take 1 tablet (2 mg total) by mouth daily. 90 tablet 3   PROAIR HFA 108 (90 Base) MCG/ACT inhaler INHALE 2 PUFFS INTO LUNGS EVERY 6 HOURS AS NEEDED 9 g 2   tobramycin (TOBREX) 0.3 % ophthalmic solution 1 drop 4 (four) times daily.     No current facility-administered medications on file prior to visit.    Allergies  Allergen Reactions   Parlodel [Bromocriptine Mesylate] Nausea Only    nausea   Peanut-Containing Drug Products Hives, Itching and Swelling   Penicillins     REACTION: rash and sob   Pioglitazone Diarrhea    tremor   Sulfa Antibiotics Hives   Zithromax [Azithromycin] Nausea And Vomiting    Family History  Problem Relation Age of Onset   Diabetes Father    Hypertension Father     Hyperlipidemia Father    Breast cancer Mother    Esophageal cancer Neg Hx    Stomach cancer Neg Hx    Colon cancer Neg Hx    Rectal cancer Neg Hx     BP 120/64 (BP Location: Right Arm, Patient Position: Sitting, Cuff Size: Normal)   Pulse 92   Ht '5\' 2"'$  (1.575 m)   Wt 142 lb (64.4 kg)   SpO2 96%   BMI 25.97 kg/m    Review of Systems     Objective:   Physical Exam Pulses: dorsalis pedis intact bilat.   MSK: no deformity of the feet CV: no leg edema Skin:  no ulcer on the feet.  normal color and temp on the feet. Neuro: sensation is intact to touch on the feet  Lab Results  Component Value Date   HGBA1C 8.0 (A) 08/26/2020       Assessment & Plan:  Type 2 DM: uncontrolled Nausea, due to Rybelsus.    Patient Instructions  Please stop taking the Rybelsus, resume the glimepiride at 1/2 of a 1 mg pill each morning, and continue the same metformin Please come back for a follow-up appointment in 3 months.   check your blood sugar once a day.  vary the time of day when you check, between before the 3 meals, and at bedtime.  also check if you have symptoms of your blood sugar being too high or too low.  please keep a record of the readings and bring it to your next appointment here.  You can write it on any piece of paper.  please call us sooner if your blood sugar goes below 70, or if you have a lot of readings over 200.

## 2020-08-26 NOTE — Patient Instructions (Addendum)
Please stop taking the Rybelsus, resume the glimepiride at 1/2 of a 1 mg pill each morning, and continue the same metformin Please come back for a follow-up appointment in 3 months.   check your blood sugar once a day.  vary the time of day when you check, between before the 3 meals, and at bedtime.  also check if you have symptoms of your blood sugar being too high or too low.  please keep a record of the readings and bring it to your next appointment here.  You can write it on any piece of paper.  please call us sooner if your blood sugar goes below 70, or if you have a lot of readings over 200.

## 2020-11-09 DIAGNOSIS — E113513 Type 2 diabetes mellitus with proliferative diabetic retinopathy with macular edema, bilateral: Secondary | ICD-10-CM | POA: Diagnosis not present

## 2020-11-09 DIAGNOSIS — H35372 Puckering of macula, left eye: Secondary | ICD-10-CM | POA: Diagnosis not present

## 2020-11-09 DIAGNOSIS — H2513 Age-related nuclear cataract, bilateral: Secondary | ICD-10-CM | POA: Diagnosis not present

## 2020-11-09 DIAGNOSIS — H31093 Other chorioretinal scars, bilateral: Secondary | ICD-10-CM | POA: Diagnosis not present

## 2020-11-26 ENCOUNTER — Other Ambulatory Visit: Payer: Self-pay

## 2020-11-26 ENCOUNTER — Ambulatory Visit: Payer: PPO | Admitting: Endocrinology

## 2020-11-26 VITALS — BP 128/68 | HR 90 | Ht 62.0 in | Wt 139.2 lb

## 2020-11-26 DIAGNOSIS — E1139 Type 2 diabetes mellitus with other diabetic ophthalmic complication: Secondary | ICD-10-CM

## 2020-11-26 LAB — POCT GLYCOSYLATED HEMOGLOBIN (HGB A1C): Hemoglobin A1C: 8.3 % — AB (ref 4.0–5.6)

## 2020-11-26 MED ORDER — GLIMEPIRIDE 1 MG PO TABS: 0.5000 mg | ORAL_TABLET | Freq: Every day | ORAL | 5 refills | Status: DC

## 2020-11-26 NOTE — Patient Instructions (Addendum)
I have sent a prescription to your pharmacy, to resume the glimepiride at 1/2 of a 1 mg pill each morning, and continue the same metformin Please come back for a follow-up appointment in 3 months.   check your blood sugar once a day.  vary the time of day when you check, between before the 3 meals, and at bedtime.  also check if you have symptoms of your blood sugar being too high or too low.  please keep a record of the readings and bring it to your next appointment here.  You can write it on any piece of paper.  please call us sooner if your blood sugar goes below 70, or if you have a lot of readings over 200.

## 2020-11-26 NOTE — Progress Notes (Signed)
Subjective:    Patient ID: Lindsay Henderson, female    DOB: [birth year], 65 y.o.   MRN: [id number]  HPI Pt returns for f/u of diabetes mellitus:  DM type: 2 Dx'ed: 9518 Complications: DR.   Therapy: 2 oral meds.   GDM: (1987 and 1992).   DKA: never Severe hypoglycemia: never.   Pancreatitis: never Other: she took insulin only for GDM; she could not afford invokana; she did not tolerate repaglinide was changed to glimepiride, as she could not take TID; she did not tolerate bromocriptine or rybelsus (nausea with both), or Jardiance (vaginitis); pioglitazone is contraindicated by macular edema.  Interval history: no cbg record, but states cbg's vary from 120-250.  She takes metformin. Past Medical History:  Diagnosis Date   Adenomatous colon polyp 11/2005   Allergic rhinitis    Allergy    Anomalous atrioventricular excitation    Anxiety    no per pt   Asthma    Diverticulosis of colon    Gastritis    GERD (gastroesophageal reflux disease)    History of supraventricular tachycardia    Hypercholesteremia    Kidney stones    Nephrolithiasis    Renal cyst    Somatic dysfunction    Type II or unspecified type diabetes mellitus without mention of complication, not stated as uncontrolled    Vitamin D deficiency     Past Surgical History:  Procedure Laterality Date   CARDIAC ELECTROPHYSIOLOGY MAPPING AND ABLATION  2004   COLONOSCOPY  2007   TUBAL LIGATION      Social History   Socioeconomic History   Marital status: Married    Spouse name: Not on file   Number of children: 4   Years of education: Not on file   Highest education level: Not on file  Occupational History   Occupation: [employer]  Tobacco Use   Smoking status: Never   Smokeless tobacco: Never  Substance and Sexual Activity   Alcohol use: No   Drug use: No   Sexual activity: Not Currently  Other Topics Concern   Not on file  Social History Narrative   Not on file   Social Determinants of  Health   Financial Resource Strain: Not on file  Food Insecurity: Not on file  Transportation Needs: Not on file  Physical Activity: Not on file  Stress: Not on file  Social Connections: Not on file  Intimate Partner Violence: Not on file    Current Outpatient Medications on File Prior to Visit  Medication Sig Dispense Refill   clotrimazole-betamethasone (LOTRISONE) cream Apply 1 application topically 2 (two) times daily. 45 g 2   cyclobenzaprine (FLEXERIL) 10 MG tablet Take 1 tablet (10 mg total) by mouth 3 (three) times daily as needed for muscle spasms. 45 tablet 0   esomeprazole (NEXIUM) 20 MG capsule Take 20 mg by mouth daily at 12 noon.     estradiol (ESTRACE VAGINAL) 0.1 MG/GM vaginal cream Use nightly as directed x 2 weeks; then use 2 x per week for maintenance 42.5 g 11   fexofenadine ([name]) 180 MG tablet Take 180 mg by mouth daily.     fluticasone (FLONASE) 50 MCG/ACT nasal spray Place 2 sprays into both nostrils daily.     metFORMIN (GLUCOPHAGE-XR) 500 MG 24 hr tablet Take 4 tablets (2,000 mg total) by mouth daily with breakfast. 360 tablet 3   Pitavastatin Calcium 2 MG TABS Take 1 tablet (2 mg total) by mouth daily. 90 tablet 3  PROAIR HFA 108 (90 Base) MCG/ACT inhaler INHALE 2 PUFFS INTO LUNGS EVERY 6 HOURS AS NEEDED 9 g 2   tobramycin (TOBREX) 0.3 % ophthalmic solution 1 drop 4 (four) times daily.     No current facility-administered medications on file prior to visit.    Allergies  Allergen Reactions   Parlodel [Bromocriptine Mesylate] Nausea Only    nausea   Peanut-Containing Drug Products Hives, Itching and Swelling   Penicillins     REACTION: rash and sob   Pioglitazone Diarrhea    tremor   Sulfa Antibiotics Hives   Zithromax [Azithromycin] Nausea And Vomiting    Family History  Problem Relation Age of Onset   Diabetes Father    Hypertension Father    Hyperlipidemia Father    Breast cancer Mother    Esophageal cancer Neg Hx    Stomach cancer Neg Hx     Colon cancer Neg Hx    Rectal cancer Neg Hx     BP 128/68 (BP Location: Right Arm, Patient Position: Sitting, Cuff Size: Normal)   Pulse 90   Ht 5\' 2"  (1.575 m)   Wt 139 lb 3.2 oz (63.1 kg)   SpO2 95%   BMI 25.46 kg/m    Review of Systems     Objective:   Physical Exam Pulses: dorsalis pedis intact bilat.   MSK: no deformity of the feet CV: no leg edema Skin:  no ulcer on the feet.  normal color and temp on the feet. Neuro: sensation is intact to touch on the feet  Lab Results  Component Value Date   HGBA1C 8.3 (A) 11/26/2020       Assessment & Plan:  Type 2 DM: uncontrolled.    Patient Instructions  I have sent a prescription to your pharmacy, to resume the glimepiride at 1/2 of a 1 mg pill each morning, and continue the same metformin Please come back for a follow-up appointment in 3 months.   check your blood sugar once a day.  vary the time of day when you check, between before the 3 meals, and at bedtime.  also check if you have symptoms of your blood sugar being too high or too low.  please keep a record of the readings and bring it to your next appointment here.  You can write it on any piece of paper.  please call us sooner if your blood sugar goes below 70, or if you have a lot of readings over 200.

## 2020-12-27 ENCOUNTER — Other Ambulatory Visit: Payer: Self-pay | Admitting: Endocrinology

## 2020-12-29 DIAGNOSIS — Z01419 Encounter for gynecological examination (general) (routine) without abnormal findings: Secondary | ICD-10-CM | POA: Diagnosis not present

## 2021-01-13 DIAGNOSIS — Z1231 Encounter for screening mammogram for malignant neoplasm of breast: Secondary | ICD-10-CM | POA: Diagnosis not present

## 2021-02-01 ENCOUNTER — Telehealth: Payer: Self-pay

## 2021-02-01 DIAGNOSIS — E1139 Type 2 diabetes mellitus with other diabetic ophthalmic complication: Secondary | ICD-10-CM

## 2021-02-01 NOTE — Telephone Encounter (Signed)
See below

## 2021-02-01 NOTE — Telephone Encounter (Signed)
Pt is requesting to have lab done a few days before her CPE (02/17/21 @10 :20) so it can be discussed at the appointment.   Please advise pt [phone]

## 2021-02-02 NOTE — Telephone Encounter (Signed)
Labs placed.

## 2021-02-02 NOTE — Telephone Encounter (Signed)
Patient notified that lab orders were placed and appointment has been scheduled.

## 2021-02-11 ENCOUNTER — Other Ambulatory Visit (INDEPENDENT_AMBULATORY_CARE_PROVIDER_SITE_OTHER): Payer: PPO

## 2021-02-11 ENCOUNTER — Other Ambulatory Visit: Payer: Self-pay

## 2021-02-11 DIAGNOSIS — E1139 Type 2 diabetes mellitus with other diabetic ophthalmic complication: Secondary | ICD-10-CM

## 2021-02-11 LAB — COMPREHENSIVE METABOLIC PANEL
ALT: 14 U/L (ref 0–35)
AST: 17 U/L (ref 0–37)
Albumin: 4.4 g/dL (ref 3.5–5.2)
Alkaline Phosphatase: 68 U/L (ref 39–117)
BUN: 12 mg/dL (ref 6–23)
CO2: 30 mEq/L (ref 19–32)
Calcium: 9.3 mg/dL (ref 8.4–10.5)
Chloride: 100 mEq/L (ref 96–112)
Creatinine, Ser: 0.69 mg/dL (ref 0.40–1.20)
GFR: 90.4 mL/min (ref >60.00)
Glucose, Bld: 159 mg/dL — ABNORMAL HIGH (ref 70–99)
Potassium: 4.1 mEq/L (ref 3.5–5.1)
Sodium: 137 mEq/L (ref 135–145)
Total Bilirubin: 0.5 mg/dL (ref 0.2–1.2)
Total Protein: 7.3 g/dL (ref 6.0–8.3)

## 2021-02-11 LAB — LIPID PANEL
Cholesterol: 239 mg/dL — ABNORMAL HIGH (ref 0–200)
HDL: 50 mg/dL (ref >39.00)
LDL Cholesterol: 170 mg/dL — ABNORMAL HIGH (ref 0–99)
NonHDL: 189.37
Total CHOL/HDL Ratio: 5
Triglycerides: 98 mg/dL (ref 0.0–149.0)
VLDL: 19.6 mg/dL (ref 0.0–40.0)

## 2021-02-11 LAB — CBC
HCT: 40.6 % (ref 36.0–46.0)
Hemoglobin: 13.4 g/dL (ref 12.0–15.0)
MCHC: 33.1 g/dL (ref 30.0–36.0)
MCV: 88.9 fl (ref 78.0–100.0)
Platelets: 354 10*3/uL (ref 150.0–400.0)
RBC: 4.57 Mil/uL (ref 3.87–5.11)
RDW: 13.8 % (ref 11.5–15.5)
WBC: 8.1 10*3/uL (ref 4.0–10.5)

## 2021-02-11 LAB — MICROALBUMIN / CREATININE URINE RATIO
Creatinine,U: 40.8 mg/dL
Microalb Creat Ratio: 1.7 mg/g (ref 0.0–30.0)
Microalb, Ur: <0.7 mg/dL (ref 0.0–1.9)

## 2021-02-11 LAB — HEMOGLOBIN A1C: Hgb A1c MFr Bld: 8.7 % — ABNORMAL HIGH (ref 4.6–6.5)

## 2021-02-15 DIAGNOSIS — H2513 Age-related nuclear cataract, bilateral: Secondary | ICD-10-CM | POA: Diagnosis not present

## 2021-02-15 DIAGNOSIS — H35372 Puckering of macula, left eye: Secondary | ICD-10-CM | POA: Diagnosis not present

## 2021-02-15 DIAGNOSIS — H31093 Other chorioretinal scars, bilateral: Secondary | ICD-10-CM | POA: Diagnosis not present

## 2021-02-15 DIAGNOSIS — E113513 Type 2 diabetes mellitus with proliferative diabetic retinopathy with macular edema, bilateral: Secondary | ICD-10-CM | POA: Diagnosis not present

## 2021-02-17 ENCOUNTER — Encounter: Payer: Self-pay | Admitting: Internal Medicine

## 2021-02-17 ENCOUNTER — Other Ambulatory Visit: Payer: Self-pay

## 2021-02-17 ENCOUNTER — Ambulatory Visit (INDEPENDENT_AMBULATORY_CARE_PROVIDER_SITE_OTHER): Payer: PPO | Admitting: Internal Medicine

## 2021-02-17 VITALS — BP 126/70 | HR 94 | Resp 18 | Ht 62.0 in | Wt 136.8 lb

## 2021-02-17 DIAGNOSIS — E1139 Type 2 diabetes mellitus with other diabetic ophthalmic complication: Secondary | ICD-10-CM

## 2021-02-17 DIAGNOSIS — K219 Gastro-esophageal reflux disease without esophagitis: Secondary | ICD-10-CM | POA: Diagnosis not present

## 2021-02-17 DIAGNOSIS — E785 Hyperlipidemia, unspecified: Secondary | ICD-10-CM

## 2021-02-17 DIAGNOSIS — E1169 Type 2 diabetes mellitus with other specified complication: Secondary | ICD-10-CM

## 2021-02-17 DIAGNOSIS — J4520 Mild intermittent asthma, uncomplicated: Secondary | ICD-10-CM

## 2021-02-17 DIAGNOSIS — Z0000 Encounter for general adult medical examination without abnormal findings: Secondary | ICD-10-CM | POA: Diagnosis not present

## 2021-02-17 NOTE — Progress Notes (Signed)
Subjective:   Patient ID: Lindsay Henderson, female    DOB: 09-Aug-1955, 66 y.o.   MRN: [id number]  HPI Here for medicare wellness and physical, no new complaints. Please see A/P for status and treatment of chronic medical problems.   Diet: DM since diabetic Physical activity: sedentary Depression/mood screen: negative Hearing: intact to whispered voice Visual acuity: grossly normal with lens, performs annual eye exam  ADLs: capable Fall risk: none Home safety: good Cognitive evaluation: intact to orientation, naming, recall and repetition EOL planning: adv directives discussed  [place] Visit from 02/17/2021 in [place] at [facility] Total Score 0        Fall Risk 04/29/2015 05/27/2015 02/10/2020 02/17/2021  Falls in the past year? No No 0 0  Was there an injury with Fall? - - 0 0  Fall Risk Category Calculator - - 0 0  Fall Risk Category - - Low Low    I have personally reviewed and have noted 1. The patient's medical and social history - reviewed today no changes 2. Their use of alcohol, tobacco or illicit drugs 3. Their current medications and supplements 4. The patient's functional ability including ADL's, fall risks, home safety risks and hearing or visual impairment. 5. Diet and physical activities 6. Evidence for depression or mood disorders 7. Care team reviewed and updated 8.  The patient is not on an opioid pain medication.  Patient Care Team: Hoyt Koch, MD as PCP - General (Internal Medicine) Past Medical History:  Diagnosis Date   Adenomatous colon polyp 11/2005   Allergic rhinitis    Allergy    Anomalous atrioventricular excitation    Anxiety    no per pt   Asthma    Diverticulosis of colon    Gastritis    GERD (gastroesophageal reflux disease)    History of supraventricular tachycardia    Hypercholesteremia    Kidney stones    Nephrolithiasis    Renal cyst    Somatic dysfunction    Type II or unspecified  type diabetes mellitus without mention of complication, not stated as uncontrolled    Vitamin D deficiency    Past Surgical History:  Procedure Laterality Date   CARDIAC ELECTROPHYSIOLOGY MAPPING AND ABLATION  2004   COLONOSCOPY  2007   TUBAL LIGATION     Family History  Problem Relation Age of Onset   Diabetes Father    Hypertension Father    Hyperlipidemia Father    Breast cancer Mother    Esophageal cancer Neg Hx    Stomach cancer Neg Hx    Colon cancer Neg Hx    Rectal cancer Neg Hx    Review of Systems  Constitutional: Negative.   HENT: Negative.    Eyes: Negative.   Respiratory:  Negative for cough, chest tightness and shortness of breath.   Cardiovascular:  Negative for chest pain, palpitations and leg swelling.  Gastrointestinal:  Negative for abdominal distention, abdominal pain, constipation, diarrhea, nausea and vomiting.  Musculoskeletal: Negative.   Skin: Negative.   Neurological: Negative.   Psychiatric/Behavioral: Negative.     Objective:  Physical Exam Constitutional:      Appearance: She is well-developed.  HENT:     Head: Normocephalic and atraumatic.  Cardiovascular:     Rate and Rhythm: Normal rate and regular rhythm.  Pulmonary:     Effort: Pulmonary effort is normal. No respiratory distress.     Breath sounds: Normal breath sounds. No wheezing or rales.  Abdominal:     General: Bowel sounds are normal. There is no distension.     Palpations: Abdomen is soft.     Tenderness: There is no abdominal tenderness. There is no rebound.  Musculoskeletal:     Cervical back: Normal range of motion.  Skin:    General: Skin is warm and dry.  Neurological:     Mental Status: She is alert and oriented to person, place, and time.     Coordination: Coordination normal.    Vitals:   02/17/21 1022  BP: 126/70  Pulse: 94  Resp: 18  SpO2: 98%  Weight: 136 lb 12.8 oz (62.1 kg)  Height: 5\' 2"  (1.575 m)   This visit occurred during the SARS-CoV-2 public  health emergency.  Safety protocols were in place, including screening questions prior to the visit, additional usage of staff PPE, and extensive cleaning of exam room while observing appropriate contact time as indicated for disinfecting solutions.   Assessment & Plan:

## 2021-02-18 NOTE — Assessment & Plan Note (Signed)
Uses albuterol prn and allergy medication. No flare today.

## 2021-02-18 NOTE — Assessment & Plan Note (Signed)
Reviewed recent lipid panel which is not at goal. She had been off her pitavastatin which she has since resumed. Will need recheck in 3-6 months.

## 2021-02-18 NOTE — Assessment & Plan Note (Signed)
HgA1c above goal at 8.7 and she has had dietary changes. She wishes to make changes to diet prior to medication changes. She will follow up with endo. Taking metformin 2000 mg daily and amaryl 1 mg daily. She is not resuming statin. Not on ACE-I/ARB and urine protein check is up to date. Foot exam done. Eye exam up to date.

## 2021-02-18 NOTE — Assessment & Plan Note (Signed)
Controlled with nexium 

## 2021-02-18 NOTE — Assessment & Plan Note (Signed)
Flu shot declines. Covid-19 declines booster. Pneumonia declines. Shingrix advised to get at pharmacy. Tetanus advised to get at pharmacy. Colonoscopy due 2026. Mammogram due 2024, pap smear aged out and dexa complete. Counseled about sun safety and mole surveillance. Counseled about the dangers of distracted driving. Given 10 year screening recommendations.

## 2021-02-25 ENCOUNTER — Ambulatory Visit: Payer: PPO

## 2021-03-03 ENCOUNTER — Ambulatory Visit: Payer: PPO | Admitting: Endocrinology

## 2021-03-03 ENCOUNTER — Other Ambulatory Visit: Payer: Self-pay

## 2021-03-03 VITALS — BP 132/70 | HR 91 | Ht 62.0 in | Wt 139.6 lb

## 2021-03-03 DIAGNOSIS — E1139 Type 2 diabetes mellitus with other diabetic ophthalmic complication: Secondary | ICD-10-CM

## 2021-03-03 LAB — POCT GLYCOSYLATED HEMOGLOBIN (HGB A1C): Hemoglobin A1C: 8.5 % — AB (ref 4.0–5.6)

## 2021-03-03 MED ORDER — GLIMEPIRIDE 1 MG PO TABS: 1.0000 mg | ORAL_TABLET | Freq: Every day | ORAL | 3 refills | Status: DC

## 2021-03-03 NOTE — Patient Instructions (Addendum)
I have sent a prescription to your pharmacy, to increase the glimepiride to 1 pill each morning, and continue the same metformin Please come back for a follow-up appointment in 2 months.   check your blood sugar once a day.  vary the time of day when you check, between before the 3 meals, and at bedtime.  also check if you have symptoms of your blood sugar being too high or too low.  please keep a record of the readings and bring it to your next appointment here.  You can write it on any piece of paper.  please call us sooner if your blood sugar goes below 70, or if you have a lot of readings over 200.

## 2021-03-03 NOTE — Progress Notes (Signed)
Subjective:    Patient ID: Lindsay Henderson, female    DOB: 1955-03-28, 66 y.o.   MRN: [id number]  HPI Pt returns for f/u of diabetes mellitus:  DM type: 2 Dx'ed: 4098 Complications: DR and PN Therapy: 2 oral meds.   GDM: (1987 and 1992).   DKA: never Severe hypoglycemia: never.   Pancreatitis: never Other: she took insulin only for GDM; she could not afford invokana; she did not tolerate repaglinide was changed to glimepiride, as she could not take TID; she did not tolerate bromocriptine or rybelsus (nausea with both), or Jardiance (vaginitis); pioglitazone is contraindicated by macular edema.   Interval history: no cbg record, but states cbg's vary from 100-210.   Past Medical History:  Diagnosis Date   Adenomatous colon polyp 11/2005   Allergic rhinitis    Allergy    Anomalous atrioventricular excitation    Anxiety    no per pt   Asthma    Diverticulosis of colon    Gastritis    GERD (gastroesophageal reflux disease)    History of supraventricular tachycardia    Hypercholesteremia    Kidney stones    Nephrolithiasis    Renal cyst    Somatic dysfunction    Type II or unspecified type diabetes mellitus without mention of complication, not stated as uncontrolled    Vitamin D deficiency     Past Surgical History:  Procedure Laterality Date   CARDIAC ELECTROPHYSIOLOGY MAPPING AND ABLATION  2004   COLONOSCOPY  2007   TUBAL LIGATION      Social History   Socioeconomic History   Marital status: Married    Spouse name: Not on file   Number of children: 4   Years of education: Not on file   Highest education level: Not on file  Occupational History   Occupation: [employer]  Tobacco Use   Smoking status: Never   Smokeless tobacco: Never  Substance and Sexual Activity   Alcohol use: No   Drug use: No   Sexual activity: Not Currently  Other Topics Concern   Not on file  Social History Narrative   Not on file   Social Determinants of Health    Financial Resource Strain: Not on file  Food Insecurity: Not on file  Transportation Needs: Not on file  Physical Activity: Not on file  Stress: Not on file  Social Connections: Not on file  Intimate Partner Violence: Not on file    Current Outpatient Medications on File Prior to Visit  Medication Sig Dispense Refill   clotrimazole-betamethasone (LOTRISONE) cream Apply 1 application topically 2 (two) times daily. 45 g 2   cyclobenzaprine (FLEXERIL) 10 MG tablet Take 1 tablet (10 mg total) by mouth 3 (three) times daily as needed for muscle spasms. 45 tablet 0   esomeprazole (NEXIUM) 20 MG capsule Take 20 mg by mouth daily at 12 noon.     estradiol (ESTRACE VAGINAL) 0.1 MG/GM vaginal cream Use nightly as directed x 2 weeks; then use 2 x per week for maintenance 42.5 g 11   fexofenadine ([name]) 180 MG tablet Take 180 mg by mouth daily.     fluticasone (FLONASE) 50 MCG/ACT nasal spray Place 2 sprays into both nostrils daily.     metFORMIN (GLUCOPHAGE-XR) 500 MG 24 hr tablet Take 4 tablets (2,000 mg total) by mouth daily with breakfast. 360 tablet 3   Pitavastatin Calcium 2 MG TABS Take 1 tablet (2 mg total) by mouth daily. 90 tablet 3   PROAIR  HFA 108 (90 Base) MCG/ACT inhaler INHALE 2 PUFFS INTO LUNGS EVERY 6 HOURS AS NEEDED 9 g 2   tobramycin (TOBREX) 0.3 % ophthalmic solution 1 drop 4 (four) times daily.     No current facility-administered medications on file prior to visit.    Allergies  Allergen Reactions   Parlodel [Bromocriptine Mesylate] Nausea Only    nausea   Peanut-Containing Drug Products Hives, Itching and Swelling   Penicillins     REACTION: rash and sob   Pioglitazone Diarrhea    tremor   Sulfa Antibiotics Hives   Zithromax [Azithromycin] Nausea And Vomiting    Family History  Problem Relation Age of Onset   Diabetes Father    Hypertension Father    Hyperlipidemia Father    Breast cancer Mother    Esophageal cancer Neg Hx    Stomach cancer Neg Hx     Colon cancer Neg Hx    Rectal cancer Neg Hx     BP 132/70    Pulse 91    Ht 5\' 2"  (1.575 m)    Wt 139 lb 9.6 oz (63.3 kg)    SpO2 97%    BMI 25.53 kg/m    Review of Systems     Objective:   Physical Exam VITAL SIGNS:  See vs page GENERAL: no distress Pulses: dorsalis pedis intact bilat.   MSK: no deformity of the feet CV: no leg edema Skin:  no ulcer on the feet.  normal color and temp on the feet. Neuro: sensation is intact to touch on the feet.    Lab Results  Component Value Date   HGBA1C 8.7 (H) 02/11/2021      Assessment & Plan:  Type 2 DM: uncontrolled.    Patient Instructions  I have sent a prescription to your pharmacy, to increase the glimepiride to 1 pill each morning, and continue the same metformin Please come back for a follow-up appointment in 2 months.   check your blood sugar once a day.  vary the time of day when you check, between before the 3 meals, and at bedtime.  also check if you have symptoms of your blood sugar being too high or too low.  please keep a record of the readings and bring it to your next appointment here.  You can write it on any piece of paper.  please call us sooner if your blood sugar goes below 70, or if you have a lot of readings over 200.

## 2021-03-09 ENCOUNTER — Ambulatory Visit (INDEPENDENT_AMBULATORY_CARE_PROVIDER_SITE_OTHER): Payer: PPO

## 2021-03-09 ENCOUNTER — Telehealth: Payer: Self-pay

## 2021-03-09 DIAGNOSIS — Z0000 Encounter for general adult medical examination without abnormal findings: Secondary | ICD-10-CM

## 2021-03-09 MED ORDER — PROAIR HFA 108 (90 BASE) MCG/ACT IN AERS: INHALATION_SPRAY | RESPIRATORY_TRACT | 2 refills | Status: DC

## 2021-03-09 MED ORDER — ESTRADIOL 0.1 MG/GM VA CREA: TOPICAL_CREAM | VAGINAL | 11 refills | Status: DC

## 2021-03-09 NOTE — Addendum Note (Signed)
Addended by: Thomes Cake on: 03/09/2021 01:39 PM ? ? Modules accepted: Orders ? ?

## 2021-03-09 NOTE — Telephone Encounter (Signed)
Patient is requesting a refill on the following rx: Proair HFA Inhaler and Estradiol 0.1mg /vaginal cream to be sent to [facility] at [facility]. ?

## 2021-03-09 NOTE — Patient Instructions (Signed)
Ms. Pescador , Thank you for taking time to come for your Medicare Wellness Visit. I appreciate your ongoing commitment to your health goals. Please review the following plan we discussed and let me know if I can assist you in the future.   Screening recommendations/referrals: Colonoscopy: 06/25/2014; due every 10 years (due 06/24/2024) Mammogram: 01/13/2021; due every 1-2 years (due 01/13/2022-01/14/2023) Bone Density: 01/08/2019; due every 2-3 years (due 01/07/2021-01/07/2022) Recommended yearly ophthalmology/optometry visit for glaucoma screening and checkup Recommended yearly dental visit for hygiene and checkup  Vaccinations: Influenza vaccine: declined (allergic reaction) Pneumococcal vaccine: declined Tdap vaccine: declined Shingles vaccine: declined   Covid-19: 03/27/2019, 04/24/2019  Advanced directives: Advance directive discussed with you today. Even though you declined this today please call our office should you change your mind and we can give you the proper paperwork for you to fill out. (Patient has a written will that her children are aware of her wishes)  Conditions/risks identified: Yes; Client understands the importance of follow-up appointments with providers by attending scheduled visits and discussed goals to eat healthier, increase physical activity 5 times a week for 30 minutes each, exercise the brain by doing stimulating brain exercises (reading, adult coloring, crafting, listening to music, puzzles, etc.), socialize and enjoy life more, get enough sleep at least 8-9 hours average per night and make time for laughter.  Next appointment: Please schedule your next Medicare Wellness Visit with your Nurse Health Advisor in 1 year by calling [phone].   Preventive Care 67 Years and Older, Female Preventive care refers to lifestyle choices and visits with your health care provider that can promote health and wellness. What does preventive care include? A yearly physical  exam. This is also called an annual well check. Dental exams once or twice a year. Routine eye exams. Ask your health care provider how often you should have your eyes checked. Personal lifestyle choices, including: Daily care of your teeth and gums. Regular physical activity. Eating a healthy diet. Avoiding tobacco and drug use. Limiting alcohol use. Practicing safe sex. Taking low-dose aspirin every day. Taking vitamin and mineral supplements as recommended by your health care provider. What happens during an annual well check? The services and screenings done by your health care provider during your annual well check will depend on your age, overall health, lifestyle risk factors, and family history of disease. Counseling  Your health care provider may ask you questions about your: Alcohol use. Tobacco use. Drug use. Emotional well-being. Home and relationship well-being. Sexual activity. Eating habits. History of falls. Memory and ability to understand (cognition). Work and work Statistician. Reproductive health. Screening  You may have the following tests or measurements: Height, weight, and BMI. Blood pressure. Lipid and cholesterol levels. These may be checked every 5 years, or more frequently if you are over 55 years old. Skin check. Lung cancer screening. You may have this screening every year starting at age 71 if you have a 30-pack-year history of smoking and currently smoke or have quit within the past 15 years. Fecal occult blood test (FOBT) of the stool. You may have this test every year starting at age 86. Flexible sigmoidoscopy or colonoscopy. You may have a sigmoidoscopy every 5 years or a colonoscopy every 10 years starting at age 64. Hepatitis C blood test. Hepatitis B blood test. Sexually transmitted disease (STD) testing. Diabetes screening. This is done by checking your blood sugar (glucose) after you have not eaten for a while (fasting). You may have this  done every  1-3 years. Bone density scan. This is done to screen for osteoporosis. You may have this done starting at age 71. Mammogram. This may be done every 1-2 years. Talk to your health care provider about how often you should have regular mammograms. Talk with your health care provider about your test results, treatment options, and if necessary, the need for more tests. Vaccines  Your health care provider may recommend certain vaccines, such as: Influenza vaccine. This is recommended every year. Tetanus, diphtheria, and acellular pertussis (Tdap, Td) vaccine. You may need a Td booster every 10 years. Zoster vaccine. You may need this after age 99. Pneumococcal 13-valent conjugate (PCV13) vaccine. One dose is recommended after age 23. Pneumococcal polysaccharide (PPSV23) vaccine. One dose is recommended after age 43. Talk to your health care provider about which screenings and vaccines you need and how often you need them. This information is not intended to replace advice given to you by your health care provider. Make sure you discuss any questions you have with your health care provider. Document Released: 01/22/2015 Document Revised: 09/15/2015 Document Reviewed: 10/27/2014 Elsevier Interactive Patient Education  2017 [place] Prevention in the Home Falls can cause injuries. They can happen to people of all ages. There are many things you can do to make your home safe and to help prevent falls. What can I do on the outside of my home? Regularly fix the edges of walkways and driveways and fix any cracks. Remove anything that might make you trip as you walk through a door, such as a raised step or threshold. Trim any bushes or trees on the path to your home. Use bright outdoor lighting. Clear any walking paths of anything that might make someone trip, such as rocks or tools. Regularly check to see if handrails are loose or broken. Make sure that both sides of any steps have  handrails. Any raised decks and porches should have guardrails on the edges. Have any leaves, snow, or ice cleared regularly. Use sand or salt on walking paths during winter. Clean up any spills in your garage right away. This includes oil or grease spills. What can I do in the bathroom? Use night lights. Install grab bars by the toilet and in the tub and shower. Do not use towel bars as grab bars. Use non-skid mats or decals in the tub or shower. If you need to sit down in the shower, use a plastic, non-slip stool. Keep the floor dry. Clean up any water that spills on the floor as soon as it happens. Remove soap buildup in the tub or shower regularly. Attach bath mats securely with double-sided non-slip rug tape. Do not have throw rugs and other things on the floor that can make you trip. What can I do in the bedroom? Use night lights. Make sure that you have a light by your bed that is easy to reach. Do not use any sheets or blankets that are too big for your bed. They should not hang down onto the floor. Have a firm chair that has side arms. You can use this for support while you get dressed. Do not have throw rugs and other things on the floor that can make you trip. What can I do in the kitchen? Clean up any spills right away. Avoid walking on wet floors. Keep items that you use a lot in easy-to-reach places. If you need to reach something above you, use a strong step stool that has a grab  bar. Keep electrical cords out of the way. Do not use floor polish or wax that makes floors slippery. If you must use wax, use non-skid floor wax. Do not have throw rugs and other things on the floor that can make you trip. What can I do with my stairs? Do not leave any items on the stairs. Make sure that there are handrails on both sides of the stairs and use them. Fix handrails that are broken or loose. Make sure that handrails are as long as the stairways. Check any carpeting to make sure  that it is firmly attached to the stairs. Fix any carpet that is loose or worn. Avoid having throw rugs at the top or bottom of the stairs. If you do have throw rugs, attach them to the floor with carpet tape. Make sure that you have a light switch at the top of the stairs and the bottom of the stairs. If you do not have them, ask someone to add them for you. What else can I do to help prevent falls? Wear shoes that: Do not have high heels. Have rubber bottoms. Are comfortable and fit you well. Are closed at the toe. Do not wear sandals. If you use a stepladder: Make sure that it is fully opened. Do not climb a closed stepladder. Make sure that both sides of the stepladder are locked into place. Ask someone to hold it for you, if possible. Clearly mark and make sure that you can see: Any grab bars or handrails. First and last steps. Where the edge of each step is. Use tools that help you move around (mobility aids) if they are needed. These include: Canes. Walkers. Scooters. Crutches. Turn on the lights when you go into a dark area. Replace any light bulbs as soon as they burn out. Set up your furniture so you have a clear path. Avoid moving your furniture around. If any of your floors are uneven, fix them. If there are any pets around you, be aware of where they are. Review your medicines with your doctor. Some medicines can make you feel dizzy. This can increase your chance of falling. Ask your doctor what other things that you can do to help prevent falls. This information is not intended to replace advice given to you by your health care provider. Make sure you discuss any questions you have with your health care provider. Document Released: 10/22/2008 Document Revised: 06/03/2015 Document Reviewed: 01/30/2014 Elsevier Interactive Patient Education  2017 [employer].

## 2021-03-09 NOTE — Telephone Encounter (Signed)
Refills have been sent to the pt's pharmacy  

## 2021-03-09 NOTE — Progress Notes (Signed)
I connected with Lindsay Henderson today by telephone and verified that I am speaking with the correct person using two identifiers. Location patient: home Location provider: work Persons participating in the virtual visit: patient, provider.   I discussed the limitations, risks, security and privacy concerns of performing an evaluation and management service by telephone and the availability of in person appointments. I also discussed with the patient that there may be a patient responsible charge related to this service. The patient expressed understanding and verbally consented to this telephonic visit.    Interactive audio and video telecommunications were attempted between this provider and patient, however failed, due to patient having technical difficulties OR patient did not have access to video capability.  We continued and completed visit with audio only.  Some vital signs may be absent or patient reported.   Time Spent with patient on telephone encounter: 40 minutes  Subjective:   Lindsay Henderson is a 66 y.o. female who presents for Medicare Annual (Subsequent) preventive examination.  Review of Systems     Cardiac Risk Factors include: advanced age (>43men, >80 women);diabetes mellitus;dyslipidemia;family history of premature cardiovascular disease     Objective:    There were no vitals filed for this visit. There is no height or weight on file to calculate BMI.  Advanced Directives 03/09/2021 04/29/2015 06/25/2014 06/11/2014  Does Patient Have a Medical Advance Directive? No No No No  Would patient like information on creating a medical advance directive? No - Patient declined No - patient declined information - -    Current Medications (verified) Outpatient Encounter Medications as of 03/09/2021  Medication Sig   clotrimazole-betamethasone (LOTRISONE) cream Apply 1 application topically 2 (two) times daily.   cyclobenzaprine (FLEXERIL) 10 MG tablet Take 1 tablet (10 mg  total) by mouth 3 (three) times daily as needed for muscle spasms.   esomeprazole (NEXIUM) 20 MG capsule Take 20 mg by mouth daily at 12 noon.   estradiol (ESTRACE VAGINAL) 0.1 MG/GM vaginal cream Use nightly as directed x 2 weeks; then use 2 x per week for maintenance   fexofenadine ([name]) 180 MG tablet Take 180 mg by mouth daily.   fluticasone (FLONASE) 50 MCG/ACT nasal spray Place 2 sprays into both nostrils daily.   glimepiride (AMARYL) 1 MG tablet Take 1 tablet (1 mg total) by mouth daily with breakfast.   metFORMIN (GLUCOPHAGE-XR) 500 MG 24 hr tablet Take 4 tablets (2,000 mg total) by mouth daily with breakfast.   Pitavastatin Calcium 2 MG TABS Take 1 tablet (2 mg total) by mouth daily.   PROAIR HFA 108 (90 Base) MCG/ACT inhaler INHALE 2 PUFFS INTO LUNGS EVERY 6 HOURS AS NEEDED   tobramycin (TOBREX) 0.3 % ophthalmic solution 1 drop 4 (four) times daily.   No facility-administered encounter medications on file as of 03/09/2021.    Allergies (verified) Parlodel [bromocriptine mesylate], Peanut-containing drug products, Penicillins, Pioglitazone, Sulfa antibiotics, and Zithromax [azithromycin]   History: Past Medical History:  Diagnosis Date   Adenomatous colon polyp 11/2005   Allergic rhinitis    Allergy    Anomalous atrioventricular excitation    Anxiety    no per pt   Asthma    Diverticulosis of colon    Gastritis    GERD (gastroesophageal reflux disease)    History of supraventricular tachycardia    Hypercholesteremia    Kidney stones    Nephrolithiasis    Renal cyst    Somatic dysfunction    Type II or unspecified type diabetes mellitus  without mention of complication, not stated as uncontrolled    Vitamin D deficiency    Past Surgical History:  Procedure Laterality Date   CARDIAC ELECTROPHYSIOLOGY MAPPING AND ABLATION  2004   COLONOSCOPY  2007   TUBAL LIGATION     Family History  Problem Relation Age of Onset   Diabetes Father    Hypertension Father     Hyperlipidemia Father    Breast cancer Mother    Esophageal cancer Neg Hx    Stomach cancer Neg Hx    Colon cancer Neg Hx    Rectal cancer Neg Hx    Social History   Socioeconomic History   Marital status: Married    Spouse name: Not on file   Number of children: 4   Years of education: Not on file   Highest education level: Not on file  Occupational History   Occupation: [employer]  Tobacco Use   Smoking status: Never   Smokeless tobacco: Never  Substance and Sexual Activity   Alcohol use: No   Drug use: No   Sexual activity: Not Currently  Other Topics Concern   Not on file  Social History Narrative   Not on file   Social Determinants of Health   Financial Resource Strain: Low Risk    Difficulty of Paying Living Expenses: Not hard at all  Food Insecurity: No Food Insecurity   Worried About Charity fundraiser in the Last Year: Never true   [place] in the Last Year: Never true  Transportation Needs: No Transportation Needs   Lack of Transportation (Medical): No   Lack of Transportation (Non-Medical): No  Physical Activity: Insufficiently Active   Days of Exercise per Week: 3 days   Minutes of Exercise per Session: 30 min  Stress: No Stress Concern Present   Feeling of Stress : Not at all  Social Connections: Socially Integrated   Frequency of Communication with Friends and Family: More than three times a week   Frequency of Social Gatherings with Friends and Family: More than three times a week   Attends Religious Services: More than 4 times per year   Active Member of [employer] or Organizations: Yes   Attends Music therapist: More than 4 times per year   Marital Status: Married    Tobacco Counseling Counseling given: Not Answered   Clinical Intake:  Pre-visit preparation completed: Yes  Pain : No/denies pain     Nutritional Risks: None Diabetes: Yes CBG done?: No Did pt. bring in CBG monitor from home?: No  How often do  you need to have someone help you when you read instructions, pamphlets, or other written materials from your doctor or pharmacy?: 1 - Never What is the last grade level you completed in school?: 2 years of community college  Diabetic? yes  Interpreter Needed?: No  Information entered by :: Lisette Abu, LPN   Activities of Daily Living In your present state of health, do you have any difficulty performing the following activities: 03/09/2021  Hearing? N  Vision? N  Difficulty concentrating or making decisions? N  Walking or climbing stairs? N  Dressing or bathing? N  Doing errands, shopping? N  Preparing Food and eating ? N  Using the Toilet? N  In the past six months, have you accidently leaked urine? N  Do you have problems with loss of bowel control? N  Managing your Medications? N  Managing your Finances? N  Housekeeping or managing your  Housekeeping? N  Some recent data might be hidden    Patient Care Team: Hoyt Koch, MD as PCP - General (Internal Medicine) Servando Salina, MD as Consulting Physician (Obstetrics and Gynecology) Thalia Bloodgood, OD as Referring Physician (Optometry) Sherlynn Stalls, MD as Consulting Physician (Ophthalmology)  Indicate any recent Medical Services you may have received from other than [facility] providers in the past year (date may be approximate).     Assessment:   This is a routine wellness examination for Lindsay Henderson.  Hearing/Vision screen Hearing Screening - Comments:: Patient denied any hearing difficulty.   No hearing aids.  Vision Screening - Comments:: Patient wears corrective glasses/contacts.  Eye exam done annually by: Dr. Ellery Plunk at [facility] and Dr. Sherlynn Stalls at [facility] (seen every 4 months)  Dietary issues and exercise activities discussed: Current Exercise Habits: Home exercise routine, Type of exercise: treadmill;walking (stair climbing), Time (Minutes): 30, Frequency  (Times/Week): 3, Weekly Exercise (Minutes/Week): 90, Intensity: Moderate, Exercise limited by: None identified   Goals Addressed             This Visit's Progress    Client will verbalize knowledge of diabetes self-management as evidenced by Hgb A1C <7 or as defined by provider.       My goal is to lower my HgA1C and cholesterol by increasing my physical activity and watching my diet.      Depression Screen PHQ 2/9 Scores 03/09/2021 02/17/2021 02/10/2020 12/19/2018 12/17/2017 11/01/2016 05/27/2015  PHQ - 2 Score 0 0 0 0 0 0 0    Fall Risk Fall Risk  03/09/2021 02/17/2021 02/10/2020 05/27/2015 04/29/2015  Falls in the past year? 0 0 0 No No  Number falls in past yr: 0 0 0 - -  Injury with Fall? 0 0 0 - -  Risk for fall due to : No Fall Risks - - - -  Follow up Falls evaluation completed - - - -    FALL RISK PREVENTION PERTAINING TO THE HOME:  Any stairs in or around the home? Yes  If so, are there any without handrails? No  Home free of loose throw rugs in walkways, pet beds, electrical cords, etc? Yes  Adequate lighting in your home to reduce risk of falls? Yes   ASSISTIVE DEVICES UTILIZED TO PREVENT FALLS:  Life alert? No  Use of a cane, walker or w/c? No  Grab bars in the bathroom? No  Shower chair or bench in shower? No  Elevated toilet seat or a handicapped toilet? Yes   TIMED UP AND GO:  Was the test performed? No .  Length of time to ambulate 10 feet: n/a sec.   Gait steady and fast without use of assistive device  Cognitive Function: Normal cognitive status assessed by direct observation by this Nurse Health Advisor. No abnormalities found.          Immunizations Immunization History  Administered Date(s) Administered   Influenza Whole 10/18/2009   Influenza-Unspecified 10/15/2014   PFIZER(Purple Top)SARS-COV-2 Vaccination 03/27/2019, 04/24/2019    TDAP status: declined  Flu Vaccine status: Declined, Education has been provided regarding the importance of this  vaccine but patient still declined. Advised may receive this vaccine at local pharmacy or Health Dept. Aware to provide a copy of the vaccination record if obtained from local pharmacy or Health Dept. Verbalized acceptance and understanding.  Pneumococcal vaccine status: Declined,  Education has been provided regarding the importance of this vaccine but patient still declined. Advised may  receive this vaccine at local pharmacy or Health Dept. Aware to provide a copy of the vaccination record if obtained from local pharmacy or Health Dept. Verbalized acceptance and understanding.   Covid-19 vaccine status: Completed vaccines  Qualifies for Shingles Vaccine? Yes   Zostavax completed No   Shingrix Completed?: No.    Education has been provided regarding the importance of this vaccine. Patient has been advised to call insurance company to determine out of pocket expense if they have not yet received this vaccine. Advised may also receive vaccine at local pharmacy or Health Dept. Verbalized acceptance and understanding.  Screening Tests Health Maintenance  Topic Date Due   Hepatitis C Screening  Never done   TETANUS/TDAP  Never done   Zoster Vaccines- Shingrix (1 of 2) Never done   OPHTHALMOLOGY EXAM  01/24/2019   COVID-19 Vaccine (3 - Booster for Pfizer series) 06/19/2019   Pneumonia Vaccine 9+ Years old (1 - PCV) Never done   MAMMOGRAM  01/07/2021   INFLUENZA VACCINE  04/08/2021 (Originally 08/09/2020)   HEMOGLOBIN A1C  08/31/2021   URINE MICROALBUMIN  02/11/2022   FOOT EXAM  02/17/2022   COLONOSCOPY (Pts 45-57yrs Insurance coverage will need to be confirmed)  06/24/2024   DEXA SCAN  Completed   HPV VACCINES  Aged Out    Health Maintenance  Health Maintenance Due  Topic Date Due   Hepatitis C Screening  Never done   TETANUS/TDAP  Never done   Zoster Vaccines- Shingrix (1 of 2) Never done   OPHTHALMOLOGY EXAM  01/24/2019   COVID-19 Vaccine (3 - Booster for Pfizer series) 06/19/2019    Pneumonia Vaccine 66+ Years old (1 - PCV) Never done   MAMMOGRAM  01/07/2021    Colorectal cancer screening: Type of screening: Colonoscopy. Completed 06/25/2014. Repeat every 10 years  Mammogram status: Completed 01/13/2021. Repeat every year  Bone Density status: Completed 01/08/2019. Results reflect: Bone density results: OSTEOPENIA. Repeat every 2-3 years.  Lung Cancer Screening: (Low Dose CT Chest recommended if Age 66-80 years, 30 pack-year currently smoking OR have quit w/in 15years.) does not qualify.   Lung Cancer Screening Referral: no  Additional Screening:  Hepatitis C Screening: does qualify; Completed no  Vision Screening: Recommended annual ophthalmology exams for early detection of glaucoma and other disorders of the eye. Is the patient up to date with their annual eye exam?  Yes  Who is the provider or what is the name of the office in which the patient attends annual eye exams? Dr. Thalia Bloodgood & Dr. Sherlynn Stalls If pt is not established with a provider, would they like to be referred to a provider to establish care? No .   Dental Screening: Recommended annual dental exams for proper oral hygiene  Community Resource Referral / Chronic Care Management: CRR required this visit?  No   CCM required this visit?  No      Plan:     I have personally reviewed and noted the following in the patients chart:   Medical and social history Use of alcohol, tobacco or illicit drugs  Current medications and supplements including opioid prescriptions.  Functional ability and status Nutritional status Physical activity Advanced directives List of other physicians Hospitalizations, surgeries, and ER visits in previous 12 months Vitals Screenings to include cognitive, depression, and falls Referrals and appointments  In addition, I have reviewed and discussed with patient certain preventive protocols, quality metrics, and best practice recommendations. A written  personalized care plan for preventive services as  well as general preventive health recommendations were provided to patient.     Sheral Flow, LPN   02/18/4763   Nurse Notes:  Patient is cogitatively intact. There were no vitals filed for this visit. There is no height or weight on file to calculate BMI. Patient stated that she has no issues with gait or balance; does not use any assistive devices. Medications reviewed with patient; no opioid use noted.

## 2021-03-16 NOTE — Telephone Encounter (Signed)
Patient calling in ? ?Patient says pharmacy is stating that rx must specifically state the grams per day that patient is suppose to use  ? ?Please update & send to pharmacy ?

## 2021-03-22 ENCOUNTER — Telehealth: Payer: Self-pay | Admitting: Internal Medicine

## 2021-03-24 MED ORDER — PITAVASTATIN CALCIUM 2 MG PO TABS: 2.0000 mg | ORAL_TABLET | Freq: Every day | ORAL | 3 refills | Status: DC

## 2021-03-24 NOTE — Addendum Note (Signed)
Addended by: Thomes Cake on: 03/24/2021 03:51 PM ? ? Modules accepted: Orders ? ?

## 2021-03-24 NOTE — Telephone Encounter (Signed)
Patient calling in ? ?Patient says her & provider discussed her to continue taking the Pitavastatin Calcium (LIVALO) 1 MG TABS but it is no longer listed on her med list ? ?Please submit new rx  ? ?[place] [place]  ?Phone:  [phone] ?Fax:  [phone] ? ?

## 2021-03-24 NOTE — Telephone Encounter (Signed)
Refill has been sent to the pharmacy.  

## 2021-04-01 DIAGNOSIS — N3020 Other chronic cystitis without hematuria: Secondary | ICD-10-CM | POA: Diagnosis not present

## 2021-04-01 DIAGNOSIS — N952 Postmenopausal atrophic vaginitis: Secondary | ICD-10-CM | POA: Diagnosis not present

## 2021-04-01 DIAGNOSIS — N8111 Cystocele, midline: Secondary | ICD-10-CM | POA: Diagnosis not present

## 2021-05-12 ENCOUNTER — Encounter: Payer: Self-pay | Admitting: Endocrinology

## 2021-05-12 ENCOUNTER — Ambulatory Visit: Payer: PPO | Admitting: Endocrinology

## 2021-05-12 VITALS — BP 128/86 | HR 87 | Ht 62.0 in | Wt 144.0 lb

## 2021-05-12 DIAGNOSIS — E1139 Type 2 diabetes mellitus with other diabetic ophthalmic complication: Secondary | ICD-10-CM

## 2021-05-12 LAB — POCT GLYCOSYLATED HEMOGLOBIN (HGB A1C): Hemoglobin A1C: 8.4 % — AB (ref 4.0–5.6)

## 2021-05-12 NOTE — Patient Instructions (Addendum)
Please continue the same glimepiride and metformin.  You can take them together, in the morning.   ?check your blood sugar once a day.  vary the time of day when you check, between before the 3 meals, and at bedtime.  also check if you have symptoms of your blood sugar being too high or too low.  please keep a record of the readings and bring it to your next appointment here.  You can write it on any piece of paper.  please call us sooner if your blood sugar goes below 70, or if you have a lot of readings over 200.   ?You should have an endocrinology follow-up appointment in 2 months.   ? ?

## 2021-05-12 NOTE — Progress Notes (Signed)
? ?Subjective:  ? ? Patient ID: Lindsay Henderson, female    DOB: Apr 24, 1955, 66 y.o.   MRN: [id number] ? 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   ?GDM: (1987 and 1992).   ?DKA: never ?Severe hypoglycemia: never.   ?Pancreatitis: never ?Other: she took insulin only for GDM; she could not afford invokana; repaglinide was changed to glimepiride, as she could not take TID; she did not tolerate bromocriptine or rybelsus (nausea with both), or Jardiance (vaginitis); pioglitazone is contraindicated by macular edema.   ?Interval history: no cbg record, but states cbg's vary from 99-300.  She takes metformin as rx'ed, but she sometimes forgets to take amaryl.   ?Past Medical History:  ?Diagnosis Date  ? Adenomatous colon polyp 11/2005  ? Allergic rhinitis   ? Allergy   ? Anomalous atrioventricular excitation   ? Anxiety   ? no per pt  ? Asthma   ? Diverticulosis of colon   ? Gastritis   ? GERD (gastroesophageal reflux disease)   ? History of supraventricular tachycardia   ? Hypercholesteremia   ? Kidney stones   ? Nephrolithiasis   ? Renal cyst   ? Somatic dysfunction   ? Type II or unspecified type diabetes mellitus without mention of complication, not stated as uncontrolled   ? Vitamin D deficiency   ? ? ?Past Surgical History:  ?Procedure Laterality Date  ? CARDIAC ELECTROPHYSIOLOGY [place] AND ABLATION  2004  ? COLONOSCOPY  2007  ? TUBAL LIGATION    ? ? ?Social History  ? ?Socioeconomic History  ? Marital status: Married  ?  Spouse name: Not on file  ? Number of children: 4  ? Years of education: Not on file  ? Highest education level: Not on file  ?Occupational History  ? Occupation: [employer]  ?Tobacco Use  ? Smoking status: Never  ? Smokeless tobacco: Never  ?Substance and Sexual Activity  ? Alcohol use: No  ? Drug use: No  ? Sexual activity: Not Currently  ?Other Topics Concern  ? Not on file  ?Social History Narrative  ? Not  on file  ? ?Social Determinants of Health  ? ?Financial Resource Strain: Low Risk   ? Difficulty of Paying Living Expenses: Not hard at all  ?Food Insecurity: No Food Insecurity  ? Worried About Charity fundraiser in the Last Year: Never true  ? Ran Out of Food in the Last Year: Never true  ?Transportation Needs: No Transportation Needs  ? Lack of Transportation (Medical): No  ? Lack of Transportation (Non-Medical): No  ?Physical Activity: Insufficiently Active  ? Days of Exercise per Week: 3 days  ? Minutes of Exercise per Session: 30 min  ?Stress: No Stress Concern Present  ? Feeling of Stress : Not at all  ?Social Connections: Socially Integrated  ? Frequency of Communication with Friends and Family: More than three times a week  ? Frequency of Social Gatherings with Friends and Family: More than three times a week  ? Attends Religious Services: More than 4 times per year  ? Active Member of Clubs or Organizations: Yes  ? Attends Archivist Meetings: More than 4 times per year  ? Marital Status: Married  ?Intimate Partner Violence: Not At Risk  ? Fear of Current or Ex-Partner: No  ? Emotionally Abused: No  ? Physically Abused: No  ? Sexually Abused: No  ? ? ?  Current Outpatient Medications on File Prior to Visit  ?Medication Sig Dispense Refill  ? clotrimazole-betamethasone (LOTRISONE) cream Apply 1 application topically 2 (two) times daily. 45 g 2  ? cyclobenzaprine (FLEXERIL) 10 MG tablet Take 1 tablet (10 mg total) by mouth 3 (three) times daily as needed for muscle spasms. 45 tablet 0  ? esomeprazole (NEXIUM) 20 MG capsule Take 20 mg by mouth daily at 12 noon.    ? estradiol (ESTRACE VAGINAL) 0.1 MG/GM vaginal cream Use nightly as directed x 2 weeks; then use 2 x per week for maintenance 42.5 g 11  ? fexofenadine ([name]) 180 MG tablet Take 180 mg by mouth daily.    ? fluticasone (FLONASE) 50 MCG/ACT nasal spray Place 2 sprays into both nostrils daily.    ? glimepiride (AMARYL) 1 MG tablet Take 1  tablet (1 mg total) by mouth daily with breakfast. 90 tablet 3  ? metFORMIN (GLUCOPHAGE-XR) 500 MG 24 hr tablet Take 4 tablets (2,000 mg total) by mouth daily with breakfast. 360 tablet 3  ? Pitavastatin Calcium 2 MG TABS Take 1 tablet (2 mg total) by mouth daily. 90 tablet 3  ? PROAIR HFA 108 (90 Base) MCG/ACT inhaler INHALE 2 PUFFS INTO LUNGS EVERY 6 HOURS AS NEEDED 9 g 2  ? tobramycin (TOBREX) 0.3 % ophthalmic solution 1 drop 4 (four) times daily.    ? ?No current facility-administered medications on file prior to visit.  ? ? ?Allergies  ?Allergen Reactions  ? Parlodel [Bromocriptine Mesylate] Nausea Only  ?  nausea  ? Peanut-Containing Drug Products Hives, Itching and Swelling  ? Penicillins   ?  REACTION: rash and sob  ? Pioglitazone Diarrhea  ?  tremor  ? Sulfa Antibiotics Hives  ? Zithromax [Azithromycin] Nausea And Vomiting  ? ? ?Family History  ?Problem Relation Age of Onset  ? Diabetes Father   ? Hypertension Father   ? Hyperlipidemia Father   ? Breast cancer Mother   ? Esophageal cancer Neg Hx   ? Stomach cancer Neg Hx   ? Colon cancer Neg Hx   ? Rectal cancer Neg Hx   ? ? ?BP 128/86 (BP Location: Left Arm, Patient Position: Sitting, Cuff Size: Normal)   Pulse 87   Ht '5\' 2"'$  (1.575 m)   Wt 144 lb (65.3 kg)   SpO2 98%   BMI 26.34 kg/m?  ? ? ?Review of Systems ?She denies hypoglycemia.   ?   ?Objective:  ? Physical Exam ?VITAL SIGNS:  See vs page. ?GENERAL: no distress.  ? ?Lab Results  ?Component Value Date  ? CREATININE 0.69 02/11/2021  ? BUN 12 02/11/2021  ? NA 137 02/11/2021  ? K 4.1 02/11/2021  ? CL 100 02/11/2021  ? CO2 30 02/11/2021  ? ? ?A1c=8.4% ?   ?Assessment & Plan:  ?Type 2 DM: uncontrolled, due to med noncompliance ? ?Patient Instructions  ?Please continue the same glimepiride and metformin.  You can take them together, in the morning.   ?check your blood sugar once a day.  vary the time of day when you check, between before the 3 meals, and at bedtime.  also check if you have symptoms of  your blood sugar being too high or too low.  please keep a record of the readings and bring it to your next appointment here.  You can write it on any piece of paper.  please call us sooner if your blood sugar goes below 70, or if you have a lot of  readings over 200.   ?You should have an endocrinology follow-up appointment in 2 months.   ? ? ? ?

## 2021-05-24 DIAGNOSIS — E113513 Type 2 diabetes mellitus with proliferative diabetic retinopathy with macular edema, bilateral: Secondary | ICD-10-CM | POA: Diagnosis not present

## 2021-05-24 DIAGNOSIS — H35372 Puckering of macula, left eye: Secondary | ICD-10-CM | POA: Diagnosis not present

## 2021-05-24 DIAGNOSIS — H2513 Age-related nuclear cataract, bilateral: Secondary | ICD-10-CM | POA: Diagnosis not present

## 2021-05-24 DIAGNOSIS — E113512 Type 2 diabetes mellitus with proliferative diabetic retinopathy with macular edema, left eye: Secondary | ICD-10-CM | POA: Diagnosis not present

## 2021-05-24 DIAGNOSIS — H31093 Other chorioretinal scars, bilateral: Secondary | ICD-10-CM | POA: Diagnosis not present

## 2021-05-24 DIAGNOSIS — E113511 Type 2 diabetes mellitus with proliferative diabetic retinopathy with macular edema, right eye: Secondary | ICD-10-CM | POA: Diagnosis not present

## 2021-07-28 ENCOUNTER — Encounter: Payer: Self-pay | Admitting: Internal Medicine

## 2021-07-28 ENCOUNTER — Ambulatory Visit: Payer: PPO | Admitting: Internal Medicine

## 2021-07-28 VITALS — BP 144/88 | HR 98 | Ht 62.0 in | Wt 142.0 lb

## 2021-07-28 DIAGNOSIS — E1139 Type 2 diabetes mellitus with other diabetic ophthalmic complication: Secondary | ICD-10-CM

## 2021-07-28 DIAGNOSIS — E785 Hyperlipidemia, unspecified: Secondary | ICD-10-CM | POA: Diagnosis not present

## 2021-07-28 DIAGNOSIS — E113593 Type 2 diabetes mellitus with proliferative diabetic retinopathy without macular edema, bilateral: Secondary | ICD-10-CM

## 2021-07-28 LAB — POCT GLYCOSYLATED HEMOGLOBIN (HGB A1C): Hemoglobin A1C: 8.7 % — AB (ref 4.0–5.6)

## 2021-07-28 MED ORDER — EZETIMIBE 10 MG PO TABS: 10.0000 mg | ORAL_TABLET | Freq: Every day | ORAL | 3 refills | Status: DC

## 2021-07-28 MED ORDER — RYBELSUS 7 MG PO TABS: 7.0000 mg | ORAL_TABLET | Freq: Every day | ORAL | 3 refills | Status: DC

## 2021-07-28 NOTE — Patient Instructions (Addendum)
Please continue: - Metformin ER 1000 mg 2x a day - Amaryl 1 mg  before breakfast  Restart: - Rybelsus 3.5 mg before b'fast x 1 week, then increase to 7 mg daily  STOP regular sodas.  STOP cookies as snacks.  In the mornings, if you plan to go on the treadmill, do not take Amaryl before b'fast.  Please stop at the lab.  Please return in 3 months with your sugar log.   PATIENT INSTRUCTIONS FOR TYPE 2 DIABETES:  DIET AND EXERCISE Diet and exercise is an important part of diabetic treatment.  We recommended aerobic exercise in the form of brisk walking (working between 40-60% of maximal aerobic capacity, similar to brisk walking) for 150 minutes per week (such as 30 minutes five days per week) along with 3 times per week performing 'resistance' training (using various gauge rubber tubes with handles) 5-10 exercises involving the major muscle groups (upper body, lower body and core) performing 10-15 repetitions (or near fatigue) each exercise. Start at half the above goal but build slowly to reach the above goals. If limited by weight, joint pain, or disability, we recommend daily walking in a swimming pool with water up to waist to reduce pressure from joints while allow for adequate exercise.    BLOOD GLUCOSES Monitoring your blood glucoses is important for continued management of your diabetes. Please check your blood glucoses 2-4 times a day: fasting, before meals and at bedtime (you can rotate these measurements - e.g. one day check before the 3 meals, the next day check before 2 of the meals and before bedtime, etc.).   HYPOGLYCEMIA (low blood sugar) Hypoglycemia is usually a reaction to not eating, exercising, or taking too much insulin/ other diabetes drugs.  Symptoms include tremors, sweating, hunger, confusion, headache, etc. Treat IMMEDIATELY with 15 grams of Carbs: 4 glucose tablets  cup regular juice/soda 2 tablespoons raisins 4 teaspoons sugar 1 tablespoon honey Recheck  blood glucose in 15 mins and repeat above if still symptomatic/blood glucose <100.  RECOMMENDATIONS TO REDUCE YOUR RISK OF DIABETIC COMPLICATIONS: * Take your prescribed MEDICATION(S) * Follow a DIABETIC diet: Complex carbs, fiber rich foods, (monounsaturated and polyunsaturated) fats * AVOID saturated/trans fats, high fat foods, >2,300 mg salt per day. * EXERCISE at least 5 times a week for 30 minutes or preferably daily.  * DO NOT SMOKE OR DRINK more than 1 drink a day. * Check your FEET every day. Do not wear tightfitting shoes. Contact us if you develop an ulcer * See your EYE doctor once a year or more if needed * Get a FLU shot once a year * Get a PNEUMONIA vaccine once before and once after age 48 years  GOALS:  * Your Hemoglobin A1c of <7%  * fasting sugars need to be <130 * after meals sugars need to be <180 (2h after you start eating) * Your Systolic BP should be 883 or lower  * Your Diastolic BP should be 80 or lower  * Your HDL (Good Cholesterol) should be 40 or higher  * Your LDL (Bad Cholesterol) should be 100 or lower. * Your Triglycerides should be 150 or lower  * Your Urine microalbumin (kidney function) should be <30 * Your Body Mass Index should be 25 or lower    Please consider the following ways to cut down carbs and fat and increase fiber and micronutrients in your diet: - substitute whole grain for white bread or pasta - substitute brown rice for white rice -  substitute 90-calorie flat bread pieces for slices of bread when possible - substitute sweet potatoes or yams for white potatoes - substitute humus for margarine - substitute tofu for cheese when possible - substitute almond or rice milk for regular milk (would not drink soy milk daily due to concern for soy estrogen influence on breast cancer risk) - substitute dark chocolate for other sweets when possible - substitute water - can add lemon or orange slices for taste - for diet sodas (artificial  sweeteners will trick your body that you can eat sweets without getting calories and will lead you to overeating and weight gain in the long run) - do not skip breakfast or other meals (this will slow down the metabolism and will result in more weight gain over time)  - can try smoothies made from fruit and almond/rice milk in am instead of regular breakfast - can also try old-fashioned (not instant) oatmeal made with almond/rice milk in am - order the dressing on the side when eating salad at a restaurant (pour less than half of the dressing on the salad) - eat as little meat as possible - can try juicing, but should not forget that juicing will get rid of the fiber, so would alternate with eating raw veg./fruits or drinking smoothies - use as little oil as possible, even when using olive oil - can dress a salad with a mix of balsamic vinegar and lemon juice, for e.g. - use agave nectar, stevia sugar, or regular sugar rather than artificial sweateners - steam or broil/roast veggies  - snack on veggies/fruit/nuts (unsalted, preferably) when possible, rather than processed foods - reduce or eliminate aspartame in diet (it is in diet sodas, chewing gum, etc) Read the labels!  Try to read Dr. Janene Harvey book: "Program for Reversing Diabetes" for other ideas for healthy eating.

## 2021-07-28 NOTE — Progress Notes (Signed)
Patient ID: Lindsay Henderson, female   DOB: 1955/03/15, 66 y.o.   MRN: [id number]  HPI: Lindsay Henderson is a 66 y.o.-year-old female, returning for follow-up for DM2, dx in 2000 (had gestational diabetes in 52 in 1992), non-insulin-dependent, uncontrolled, with complications (DR, macular edema). Pt. previously saw Dr. Loanne Drilling, last visit 2.5 months ago.  Reviewed HbA1c: Lab Results  Component Value Date   HGBA1C 8.4 (A) 05/12/2021   HGBA1C 8.5 (A) 03/03/2021   HGBA1C 8.7 (H) 02/11/2021   HGBA1C 8.3 (A) 11/26/2020   HGBA1C 8.0 (A) 08/26/2020   HGBA1C 7.3 (A) 03/30/2020   HGBA1C 7.3 (A) 12/24/2019   HGBA1C 7.0 (A) 09/23/2019   HGBA1C 7.0 (A) 07/09/2019   HGBA1C 6.9 (A) 04/15/2019   Pt is on a regimen of: - Metformin ER 1000 mg 2x a day - Amaryl 1 mg with breakfast  She previously had vaginitis with Jardiance, despite significant improvement in her blood sugars while on it. Invokana was not affordable. She was previously on repaglinide before changing to glimepiride. She tried bromocriptine and Rybelsus and they caused occasional gas pains. She had diarrhea and tremors with Actos. She refused insulin.  Pt checks her sugars 1-2x a day and they are: - am: 146-183 - 2h after b'fast: 151, 167 - before lunch: n/c - 2h after lunch: n/c - before dinner: 135 - 2h after dinner: 259, 286 (cake) - bedtime: n/c - nighttime: n/c Lowest sugar was 101; she has hypoglycemia awareness at 70.  Highest sugar was 286.  Glucometer: ReliOn  Snacks: oatmeal cookies, regular sodas.   - no CKD, last BUN/creatinine:  Lab Results  Component Value Date   BUN 12 02/11/2021   BUN 12 02/10/2020   CREATININE 0.69 02/11/2021   CREATININE 0.63 02/10/2020  She is not on ACE inhibitor/ARB  -+ HL; last set of lipids: Lab Results  Component Value Date   CHOL 239 (H) 02/11/2021   HDL 50.00 02/11/2021   LDLCALC 170 (H) 02/11/2021   LDLDIRECT 152.7 08/26/2012   TRIG 98.0 02/11/2021   CHOLHDL 5  02/11/2021  She is on Livalo 2 mg occasionally due the muscle aches. Prev. On Crestor >> mm aches.  - last eye exam was in 02/2021. + PDR B, + macular edema. Dr. Baird Cancer.  - no numbness and tingling in her feet.  Last foot exam 02/17/2021.  Patient also has a history of GERD, renal stones, asthma, history of SVT, vitamin D deficiency, anxiety.  ROS: + see HPI No increased urination, blurry vision, nausea, chest pain.  Past Medical History:  Diagnosis Date   Adenomatous colon polyp 11/2005   Allergic rhinitis    Allergy    Anomalous atrioventricular excitation    Anxiety    no per pt   Asthma    Diverticulosis of colon    Gastritis    GERD (gastroesophageal reflux disease)    History of supraventricular tachycardia    Hypercholesteremia    Kidney stones    Nephrolithiasis    Renal cyst    Somatic dysfunction    Type II or unspecified type diabetes mellitus without mention of complication, not stated as uncontrolled    Vitamin D deficiency    Past Surgical History:  Procedure Laterality Date   CARDIAC ELECTROPHYSIOLOGY MAPPING AND ABLATION  2004   COLONOSCOPY  2007   TUBAL LIGATION     Social History   Socioeconomic History   Marital status: Married    Spouse name: Not on file  Number of children: 4   Years of education: Not on file   Highest education level: Not on file  Occupational History   Occupation: [employer]  Tobacco Use   Smoking status: Never   Smokeless tobacco: Never  Substance and Sexual Activity   Alcohol use: No   Drug use: No   Sexual activity: Not Currently  Other Topics Concern   Not on file  Social History Narrative   Not on file   Social Determinants of Health   Financial Resource Strain: Low Risk  (03/09/2021)   Overall Financial Resource Strain (CARDIA)    Difficulty of Paying Living Expenses: Not hard at all  Food Insecurity: No Food Insecurity (03/09/2021)   Hunger Vital Sign    Worried About Running Out of Food in the Last  Year: Never true    [place] in the Last Year: Never true  Transportation Needs: No Transportation Needs (03/09/2021)   PRAPARE - Hydrologist (Medical): No    Lack of Transportation (Non-Medical): No  Physical Activity: Insufficiently Active (03/09/2021)   Exercise Vital Sign    Days of Exercise per Week: 3 days    Minutes of Exercise per Session: 30 min  Stress: No Stress Concern Present (03/09/2021)   [place]    Feeling of Stress : Not at all  Social Connections: [place] (03/09/2021)   Social Connection and Isolation Panel [NHANES]    Frequency of Communication with Friends and Family: More than three times a week    Frequency of Social Gatherings with Friends and Family: More than three times a week    Attends Religious Services: More than 4 times per year    Active Member of [employer] or Organizations: Yes    Attends Music therapist: More than 4 times per year    Marital Status: Married  Human resources officer Violence: Not At Risk (03/09/2021)   Humiliation, Afraid, Rape, and Kick questionnaire    Fear of Current or Ex-Partner: No    Emotionally Abused: No    Physically Abused: No    Sexually Abused: No   Current Outpatient Medications on File Prior to Visit  Medication Sig Dispense Refill   clotrimazole-betamethasone (LOTRISONE) cream Apply 1 application topically 2 (two) times daily. 45 g 2   cyclobenzaprine (FLEXERIL) 10 MG tablet Take 1 tablet (10 mg total) by mouth 3 (three) times daily as needed for muscle spasms. 45 tablet 0   esomeprazole (NEXIUM) 20 MG capsule Take 20 mg by mouth daily at 12 noon.     estradiol (ESTRACE VAGINAL) 0.1 MG/GM vaginal cream Use nightly as directed x 2 weeks; then use 2 x per week for maintenance 42.5 g 11   fexofenadine ([name]) 180 MG tablet Take 180 mg by mouth daily.     fluticasone (FLONASE) 50 MCG/ACT nasal spray Place 2  sprays into both nostrils daily.     glimepiride (AMARYL) 1 MG tablet Take 1 tablet (1 mg total) by mouth daily with breakfast. 90 tablet 3   metFORMIN (GLUCOPHAGE-XR) 500 MG 24 hr tablet Take 4 tablets (2,000 mg total) by mouth daily with breakfast. 360 tablet 3   Pitavastatin Calcium 2 MG TABS Take 1 tablet (2 mg total) by mouth daily. 90 tablet 3   PROAIR HFA 108 (90 Base) MCG/ACT inhaler INHALE 2 PUFFS INTO LUNGS EVERY 6 HOURS AS NEEDED 9 g 2   tobramycin (TOBREX) 0.3 %  ophthalmic solution 1 drop 4 (four) times daily.     No current facility-administered medications on file prior to visit.   Allergies  Allergen Reactions   Parlodel [Bromocriptine Mesylate] Nausea Only    nausea   Peanut-Containing Drug Products Hives, Itching and Swelling   Penicillins     REACTION: rash and sob   Pioglitazone Diarrhea    tremor   Sulfa Antibiotics Hives   Zithromax [Azithromycin] Nausea And Vomiting   Family History  Problem Relation Age of Onset   Diabetes Father    Hypertension Father    Hyperlipidemia Father    Breast cancer Mother    Esophageal cancer Neg Hx    Stomach cancer Neg Hx    Colon cancer Neg Hx    Rectal cancer Neg Hx    PE: BP (!) 144/88 (BP Location: Left Arm, Patient Position: Sitting, Cuff Size: Normal)   Pulse 98   Ht '5\' 2"'$  (1.575 m)   Wt 142 lb (64.4 kg)   SpO2 95%   BMI 25.97 kg/m  Wt Readings from Last 3 Encounters:  07/28/21 142 lb (64.4 kg)  05/12/21 144 lb (65.3 kg)  03/03/21 139 lb 9.6 oz (63.3 kg)   Constitutional: normal weight, in NAD Eyes: EOMI, no exophthalmos ENT: moist mucous membranes, no thyromegaly, no cervical lymphadenopathy Cardiovascular: tachycardia, RR, No MRG Respiratory: CTA B Musculoskeletal: no deformities Skin: moist, warm, no rashes Neurological: no tremor with outstretched hands  ASSESSMENT: 1. DM2, non-insulin-dependent, uncontrolled, with complications -Proliferative diabetic retinopathy bilaterally -Macular edema  2.  HL  PLAN:  1. Patient with long-standing, uncontrolled diabetes, on oral antidiabetic regimen, with poor control. At today's visit, HbA1c is higher, at 8.7%.  Reviewing her blood sugars at home, they are above target in the morning, more controlled midday and high after dinner, in the upper 200s.  However, she only has a few blood sugar checks later in the day and at this visit we discussed about rotating the blood sugar checks more.   -She does admit to dietary indiscretions including drinking regular sodas every day, mostly with dinner, and snacking on cookies between meals.  We discussed that these will need to stop to help her gain control of her diabetes.  I advised her to stop regular sodas and if she eats a cookie, to eat it after a meal, as dessert, not as a snack.  Discussed about healthier snacks including unsalted peanuts and fruits. -After the above changes, I suspect that her blood sugars will improve, but I am not sure if this is enough.  She is willing to try Rybelsus again, which helped her blood sugars significantly.  We can start at a low-dose and increase as tolerated.  She does not have a history of pancreatitis. -For now, we will continue Amaryl, but I plan to discontinue this at next visit if possible.  She does complain of her blood sugars dropping too low if she exercises in the morning, and for this reason she is not currently exercising.  I advised her that she can restart exercising on the treadmill but she needs to skip the Amaryl in the days that she plans to do so. -For now, we will continue the metformin at the same dose.  Kidney function is at normal. -I am concerned about possible insulin deficiency so at today's visit we will check her insulin production and antipancreatic antibodies.  We did discuss that if she does have insulin deficiency, she likely will need to start insulin.  She has not used this before.  She will need to have training for it. - I suggested to:   Patient Instructions  Please continue: - Metformin ER 1000 mg 2x a day - Amaryl 1 mg  before breakfast  Restart: - Rybelsus 3.5 mg before b'fast x 1 week, then increase to 7 mg daily  STOP regular sodas.  STOP cookies as snacks.  In the mornings, if you plan to go on the treadmill, do not take Amaryl before b'fast.  Please stop at the lab.  Please return in 3 months with your sugar log.   - check sugars at different times of the day - check 1-2x a day, rotating checks - discussed about CBG targets for treatment: 80-130 mg/dL before meals and <180 mg/dL after meals; target HbA1c <7%. - given sugar log and advised how to fill it and to bring it at next appt  - given foot care handout  - given instructions for hypoglycemia management "15-15 rule"  - advised for yearly eye exams  - Return to clinic in 3 mo with sugar log   2. HL - Reviewed latest lipid panel: very high LDL: Lab Results  Component Value Date   CHOL 239 (H) 02/11/2021   HDL 50.00 02/11/2021   LDLCALC 170 (H) 02/11/2021   LDLDIRECT 152.7 08/26/2012   TRIG 98.0 02/11/2021   CHOLHDL 5 02/11/2021  - Continues Livalo 2 mg  but not daily 2/2 still mm aches.  - Will try to start Zetia 10 mg daily.  She agrees with this.  - Total time spent for the visit: 40 min, in precharting, reviewing Dr. Cordelia Pen last note, obtaining medical information from the chart and from the pt, reviewing her  previous labs, evaluations, and treatments, reviewing her symptoms, counseling her about her diabetes (please see the discussed topics above), and developing a plan to further treat it; she had a number of questions which I addressed  Component     Latest Ref Rng 07/28/2021  Hemoglobin A1C     4.0 - 5.6 % 8.7 !   Glucose     65 - 99 mg/dL 165 (H)   Islet Cell Ab     Neg:<1:1  Negative   ZNT8 Antibodies     <15 U/mL <10   C-Peptide     0.80 - 3.85 ng/mL 1.78   Glutamic Acid Decarb Ab     <5 IU/mL 10 (H)    She does not have  low insulin production, but her GAD antibodies are positive.  This qualifies her for LADA.  For now, we can try to follow her without insulin, but with close attention to her blood sugars.  Philemon Kingdom, MD PhD [facility]

## 2021-07-29 LAB — ANTI-ISLET CELL ANTIBODY: Islet Cell Ab: NEGATIVE

## 2021-08-04 LAB — GLUCOSE, FASTING: Glucose, Bld: 165 mg/dL — ABNORMAL HIGH (ref 65–99)

## 2021-08-04 LAB — GLUTAMIC ACID DECARBOXYLASE AUTO ABS: Glutamic Acid Decarb Ab: 10 IU/mL — ABNORMAL HIGH (ref <5)

## 2021-08-04 LAB — ZNT8 ANTIBODIES: ZNT8 Antibodies: <10 U/mL (ref <15)

## 2021-08-04 LAB — C-PEPTIDE: C-Peptide: 1.78 ng/mL (ref 0.80–3.85)

## 2021-08-12 ENCOUNTER — Ambulatory Visit (INDEPENDENT_AMBULATORY_CARE_PROVIDER_SITE_OTHER): Payer: PPO | Admitting: Family Medicine

## 2021-08-12 ENCOUNTER — Encounter: Payer: Self-pay | Admitting: Family Medicine

## 2021-08-12 VITALS — BP 132/66 | HR 100 | Temp 97.8°F | Ht 62.0 in | Wt 141.0 lb

## 2021-08-12 DIAGNOSIS — R7309 Other abnormal glucose: Secondary | ICD-10-CM | POA: Diagnosis not present

## 2021-08-12 DIAGNOSIS — R300 Dysuria: Secondary | ICD-10-CM

## 2021-08-12 DIAGNOSIS — R81 Glycosuria: Secondary | ICD-10-CM

## 2021-08-12 DIAGNOSIS — N3000 Acute cystitis without hematuria: Secondary | ICD-10-CM | POA: Diagnosis not present

## 2021-08-12 LAB — POCT URINALYSIS DIPSTICK
Bilirubin, UA: NEGATIVE
Blood, UA: NEGATIVE
Glucose, UA: POSITIVE — AB
Ketones, UA: NEGATIVE
Leukocytes, UA: NEGATIVE
Nitrite, UA: NEGATIVE
Protein, UA: NEGATIVE
Spec Grav, UA: 1.025 (ref 1.010–1.025)
Urobilinogen, UA: 0.2 E.U./dL
pH, UA: 5.5 (ref 5.0–8.0)

## 2021-08-12 MED ORDER — NITROFURANTOIN MONOHYD MACRO 100 MG PO CAPS: 100.0000 mg | ORAL_CAPSULE | Freq: Two times a day (BID) | ORAL | 0 refills | Status: DC

## 2021-08-12 NOTE — Progress Notes (Signed)
Subjective:  Lindsay Henderson is a 66 y.o. female who complains of possible urinary tract infection.  She has had symptoms for 5 days.  Symptoms include  dysuria, urinary urgency and frequency, suprapubic pressure, low back pain . Patient denies  fever, chills, N/V/D or constipation .  Last UTI was 2 years ago.   Using AZO for current symptoms.    States she used to use Estrace vaginal cream but she stopped a couple months ago.  States she did not get UTIs when she was using this.  Reports eating more cake and sweets than usual due to several birthdays in her family.  Her blood sugars have been significantly elevated recently.  She is now seeing endocrinology and started on a new diabetes medication.  Patient does not have a history of recurrent UTI. Patient does not have a history of pyelonephritis.  No other aggravating or relieving factors.  No other c/o.  Past Medical History:  Diagnosis Date   Adenomatous colon polyp 11/2005   Allergic rhinitis    Allergy    Anomalous atrioventricular excitation    Anxiety    no per pt   Asthma    Diverticulosis of colon    Gastritis    GERD (gastroesophageal reflux disease)    History of supraventricular tachycardia    Hypercholesteremia    Kidney stones    Nephrolithiasis    Renal cyst    Somatic dysfunction    Type II or unspecified type diabetes mellitus without mention of complication, not stated as uncontrolled    Vitamin D deficiency     ROS as in subjective  Reviewed allergies, medications, past medical, surgical, and social history.    Objective: Vitals:   08/12/21 1418  BP: 132/66  Pulse: 100  Temp: 97.8 F (36.6 C)  SpO2: 98%    General appearance: alert, no distress, WD/WN, female Abdomen: +bs, soft, non tender, non distended Back: no CVA tenderness GU: deferred      Laboratory:  Urine dipstick: 3+ for glucose.  Neg otherwise.      Assessment: Acute cystitis without hematuria - Plan: nitrofurantoin,  macrocrystal-monohydrate, (MACROBID) 100 MG capsule  Dysuria - Plan: POCT urinalysis dipstick  Elevated serum glucose with glucosuria   Plan: Discussed symptoms, diagnosis, possible complications, and usual course of illness.  Macrobid prescribed.  Advised increased water intake, can use OTC Tylenol for pain.    Advised to work on getting her blood sugar under better control.  Recently started on new diabetes medication.  Sees endocrinology. She will start back on Estrace vaginal  Urine culture not sent  Call or return if worse or not improving.

## 2021-08-12 NOTE — Patient Instructions (Addendum)
Take the antibiotic as prescribed.  Increase water intake  Start back using the Estrace vaginal cream  Work on getting your blood sugars under better control  Follow-up if you are getting worse or not back to baseline when you complete the antibiotic

## 2021-08-30 DIAGNOSIS — H35372 Puckering of macula, left eye: Secondary | ICD-10-CM | POA: Diagnosis not present

## 2021-08-30 DIAGNOSIS — E113511 Type 2 diabetes mellitus with proliferative diabetic retinopathy with macular edema, right eye: Secondary | ICD-10-CM | POA: Diagnosis not present

## 2021-08-30 DIAGNOSIS — H2513 Age-related nuclear cataract, bilateral: Secondary | ICD-10-CM | POA: Diagnosis not present

## 2021-08-30 DIAGNOSIS — E113512 Type 2 diabetes mellitus with proliferative diabetic retinopathy with macular edema, left eye: Secondary | ICD-10-CM | POA: Diagnosis not present

## 2021-08-30 DIAGNOSIS — H31093 Other chorioretinal scars, bilateral: Secondary | ICD-10-CM | POA: Diagnosis not present

## 2021-08-30 DIAGNOSIS — E113513 Type 2 diabetes mellitus with proliferative diabetic retinopathy with macular edema, bilateral: Secondary | ICD-10-CM | POA: Diagnosis not present

## 2021-10-12 ENCOUNTER — Encounter: Payer: Self-pay | Admitting: Internal Medicine

## 2021-10-12 ENCOUNTER — Ambulatory Visit (INDEPENDENT_AMBULATORY_CARE_PROVIDER_SITE_OTHER): Payer: PPO | Admitting: Internal Medicine

## 2021-10-12 ENCOUNTER — Telehealth: Payer: Self-pay

## 2021-10-12 DIAGNOSIS — J4521 Mild intermittent asthma with (acute) exacerbation: Secondary | ICD-10-CM | POA: Diagnosis not present

## 2021-10-12 MED ORDER — PROAIR HFA 108 (90 BASE) MCG/ACT IN AERS: INHALATION_SPRAY | RESPIRATORY_TRACT | 2 refills | Status: DC

## 2021-10-12 MED ORDER — DOXYCYCLINE HYCLATE 100 MG PO TABS: 100.0000 mg | ORAL_TABLET | Freq: Two times a day (BID) | ORAL | 0 refills | Status: DC

## 2021-10-12 MED ORDER — FLUCONAZOLE 150 MG PO TABS: 150.0000 mg | ORAL_TABLET | ORAL | 0 refills | Status: DC

## 2021-10-12 NOTE — Telephone Encounter (Signed)
Pharmacy is calling about Rx for PROAIR HFA 108 (90 Base) MCG/ACT inhaler he states that it doesn't come as that brand name any longer wanting to know can he fill the generic.   Please advise.

## 2021-10-12 NOTE — Assessment & Plan Note (Signed)
With flare today from likely bacteria infection. Rx doxycycline. Will avoid steroids as there is not significant wheezing and HgA1c >8. Refilled albuterol today as well.

## 2021-10-12 NOTE — Patient Instructions (Addendum)
We have sent in doxycycline to take 1 pill twice a day for 1 week.  We have refilled the albuterol.

## 2021-10-12 NOTE — Telephone Encounter (Signed)
Please advise 

## 2021-10-12 NOTE — Progress Notes (Signed)
   Subjective:   Patient ID: Lindsay Henderson, female    DOB: January 25, 1955, 66 y.o.   MRN: [id number]  HPI The patient is a 66 YO female coming in for cold symptoms 2 weeks.   Review of Systems  Constitutional:  Negative for activity change, appetite change, chills, fatigue, fever and unexpected weight change.  HENT:  Positive for congestion, postnasal drip, rhinorrhea and sinus pressure. Negative for ear discharge, ear pain, sinus pain, sneezing, sore throat, tinnitus, trouble swallowing and voice change.   Eyes: Negative.   Respiratory:  Positive for cough and wheezing. Negative for chest tightness and shortness of breath.   Cardiovascular: Negative.   Gastrointestinal: Negative.   Musculoskeletal:  Positive for myalgias.  Neurological: Negative.     Objective:  Physical Exam Constitutional:      Appearance: She is well-developed.  HENT:     Head: Normocephalic and atraumatic.  Cardiovascular:     Rate and Rhythm: Normal rate and regular rhythm.  Pulmonary:     Effort: Pulmonary effort is normal. No respiratory distress.     Breath sounds: Rhonchi present. No wheezing or rales.  Abdominal:     General: Bowel sounds are normal. There is no distension.     Palpations: Abdomen is soft.     Tenderness: There is no abdominal tenderness. There is no rebound.  Musculoskeletal:     Cervical back: Normal range of motion.  Skin:    General: Skin is warm and dry.  Neurological:     Mental Status: She is alert and oriented to person, place, and time.     Coordination: Coordination normal.     Vitals:   10/12/21 0826 10/12/21 0827  BP: (!) 140/78 138/76  Pulse: (!) 104   Temp: 98.3 F (36.8 C)   SpO2: 98%   Weight: 140 lb (63.5 kg)   Height: '5\' 2"'$  (1.575 m)     Assessment & Plan:

## 2021-10-14 ENCOUNTER — Other Ambulatory Visit: Payer: Self-pay | Admitting: *Deleted

## 2021-10-14 DIAGNOSIS — J4521 Mild intermittent asthma with (acute) exacerbation: Secondary | ICD-10-CM

## 2021-10-14 MED ORDER — ALBUTEROL SULFATE HFA 108 (90 BASE) MCG/ACT IN AERS: 2.0000 | INHALATION_SPRAY | Freq: Four times a day (QID) | RESPIRATORY_TRACT | 3 refills | Status: DC | PRN

## 2021-10-14 NOTE — Telephone Encounter (Signed)
Patient is calling in asking what the name of the generic of Pro Air is.

## 2021-10-14 NOTE — Telephone Encounter (Signed)
Okay to fill generic

## 2021-10-14 NOTE — Telephone Encounter (Signed)
Sent Rx albuterol (VENTOLIN HFA) 108 (90 Base) MCG/ACT inhaler--to [facility].

## 2021-10-18 ENCOUNTER — Encounter: Payer: Self-pay | Admitting: Internal Medicine

## 2021-10-18 NOTE — Telephone Encounter (Signed)
Patient is calling about the message below. Asked if she can get prednisone called in or another antibiotic.

## 2021-10-19 MED ORDER — PREDNISONE 20 MG PO TABS: 40.0000 mg | ORAL_TABLET | Freq: Every day | ORAL | 0 refills | Status: DC

## 2021-10-27 ENCOUNTER — Telehealth: Payer: Self-pay | Admitting: Internal Medicine

## 2021-10-27 DIAGNOSIS — E113593 Type 2 diabetes mellitus with proliferative diabetic retinopathy without macular edema, bilateral: Secondary | ICD-10-CM

## 2021-10-27 MED ORDER — METFORMIN HCL ER 500 MG PO TB24: 2000.0000 mg | ORAL_TABLET | Freq: Every day | ORAL | 0 refills | Status: DC

## 2021-10-27 NOTE — Telephone Encounter (Signed)
MEDICATION: Metformin  PHARMACY:  [place], [place] - [address] CONTACTED THEIR PHARMACY? yes   IS THIS A 90 DAY SUPPLY : yes  IS PATIENT OUT OF MEDICATION: yes  IF NOT; HOW MUCH IS LEFT:   LAST APPOINTMENT DATE: '@7'$ /20/2023  NEXT APPOINTMENT DATE:'@10'$ /31/2023  DO WE HAVE YOUR PERMISSION TO LEAVE A DETAILED MESSAGE?: yes OTHER COMMENTS:    **Let patient know to contact pharmacy at the end of the day to make sure medication is ready. **  ** Please notify patient to allow 48-72 hours to process**  **Encourage patient to contact the pharmacy for refills or they can request refills through [facility]**

## 2021-10-27 NOTE — Telephone Encounter (Signed)
Rx sent to preferred pharmacy.

## 2021-11-03 ENCOUNTER — Ambulatory Visit (INDEPENDENT_AMBULATORY_CARE_PROVIDER_SITE_OTHER): Payer: PPO | Admitting: Family Medicine

## 2021-11-03 ENCOUNTER — Ambulatory Visit (INDEPENDENT_AMBULATORY_CARE_PROVIDER_SITE_OTHER): Payer: PPO

## 2021-11-03 ENCOUNTER — Encounter: Payer: Self-pay | Admitting: Family Medicine

## 2021-11-03 VITALS — BP 136/76 | HR 97 | Temp 97.6°F | Ht 62.0 in | Wt 140.0 lb

## 2021-11-03 DIAGNOSIS — J0140 Acute pansinusitis, unspecified: Secondary | ICD-10-CM

## 2021-11-03 DIAGNOSIS — R058 Other specified cough: Secondary | ICD-10-CM | POA: Diagnosis not present

## 2021-11-03 DIAGNOSIS — J309 Allergic rhinitis, unspecified: Secondary | ICD-10-CM

## 2021-11-03 DIAGNOSIS — J4520 Mild intermittent asthma, uncomplicated: Secondary | ICD-10-CM

## 2021-11-03 DIAGNOSIS — R059 Cough, unspecified: Secondary | ICD-10-CM | POA: Diagnosis not present

## 2021-11-03 DIAGNOSIS — J45909 Unspecified asthma, uncomplicated: Secondary | ICD-10-CM | POA: Diagnosis not present

## 2021-11-03 MED ORDER — DOXYCYCLINE HYCLATE 100 MG PO TABS: 100.0000 mg | ORAL_TABLET | Freq: Two times a day (BID) | ORAL | 0 refills | Status: DC

## 2021-11-03 MED ORDER — FLUCONAZOLE 150 MG PO TABS: 150.0000 mg | ORAL_TABLET | ORAL | 0 refills | Status: DC

## 2021-11-03 NOTE — Patient Instructions (Addendum)
Please go downstairs for a chest x-ray before you leave today.  Take the antibiotic as prescribed.  Take over-the-counter Mucinex.  Use your albuterol inhaler twice daily for now.  Drink an extra bottle of water daily.  Switch [name] to [name] for allergies.  Follow-up if you are not back to baseline when you complete the antibiotic.

## 2021-11-03 NOTE — Progress Notes (Signed)
Subjective:  Lindsay Henderson is a 66 y.o. female who presents for a persistent cough and chest congestion.  She also recently developed nasal congestion. States she got some better with antibiotic but symptoms returned.  Recently took a course of Doxycycline with her last dose approx 10-12 days ago.  She took a few oral steroid doses but her blood sugars spiked above 300 so she stopped.  Using albuterol inhaler once daily Has underlying asthma.   Allergies- has been on [name] for years.   Denies fever, chills, dizziness, chest pain, shortness of breath, abdominal pain, N/V/D.   No other aggravating or relieving factors.  No other c/o.  ROS as in subjective.   Objective: Vitals:   11/03/21 0841  BP: 136/76  Pulse: 97  Temp: 97.6 F (36.4 C)  SpO2: 98%    General appearance: Alert, WD/WN, no distress, mildly ill appearing                             Skin: warm, no rash                           Head: + Frontal and maxillary sinus tenderness                            Eyes: conjunctiva normal, corneas clear, PERRLA                            Ears: pearly TMs, external ear canals normal                          Nose: septum midline, turbinates swollen, with erythema              Mouth/throat: MMM, tongue normal, mild pharyngeal erythema                           Neck: supple, no adenopathy, no thyromegaly, nontender                          Heart: RRR                         Lungs: CTA bilaterally, no wheezes, rales, or rhonchi      Assessment: Cough present for greater than 3 weeks - Plan: doxycycline (VIBRA-TABS) 100 MG tablet, DG Chest 2 View  Acute non-recurrent pansinusitis - Plan: doxycycline (VIBRA-TABS) 100 MG tablet  Allergic rhinitis, unspecified seasonality, unspecified trigger  Mild intermittent asthma without complication   Plan: She is not in any acute distress.   Reviewed notes from PCP visit earlier this month.  She did get some better after  doxycycline but symptoms returned. Now has sinusitis.   Doxycycline refilled due to patient's allergies to other antibiotics.  She may hold off on oral steroids since she did not complete these at home due to hyperglycemia. Chest x-ray ordered.  Recommend using albuterol inhaler twice daily for now, switching [name] which she has been on for years to [name].  Recommend Mucinex. Diflucan prescribed per patient request for anticipated yeast infection related to antibiotic use.  Nasal saline spray for congestion.   Follow-up if not back to baseline when she completes the antibiotic.  Visit time 20 minutes in face to face communication with patient and coordination of care, additional 10 minutes spent in record review, coordination or care, ordering tests, communicating/referring to other healthcare professionals, documenting in medical records all on the same day of the visit for total time 30 minutes spent on the visit.

## 2021-11-08 ENCOUNTER — Encounter: Payer: Self-pay | Admitting: Internal Medicine

## 2021-11-08 ENCOUNTER — Ambulatory Visit: Payer: PPO | Admitting: Internal Medicine

## 2021-11-08 VITALS — BP 130/68 | HR 90 | Ht 62.0 in | Wt 139.4 lb

## 2021-11-08 DIAGNOSIS — E785 Hyperlipidemia, unspecified: Secondary | ICD-10-CM | POA: Diagnosis not present

## 2021-11-08 DIAGNOSIS — E13319 Other specified diabetes mellitus with unspecified diabetic retinopathy without macular edema: Secondary | ICD-10-CM | POA: Diagnosis not present

## 2021-11-08 DIAGNOSIS — E1369 Other specified diabetes mellitus with other specified complication: Secondary | ICD-10-CM | POA: Diagnosis not present

## 2021-11-08 LAB — POCT GLYCOSYLATED HEMOGLOBIN (HGB A1C): Hemoglobin A1C: 7.9 % — AB (ref 4.0–5.6)

## 2021-11-08 NOTE — Progress Notes (Signed)
Patient ID: Lindsay Henderson, female   DOB: 1955-10-25, 66 y.o.   MRN: [id number]  HPI: Lindsay Henderson is a 66 y.o.-year-old female, returning for follow-up for LADA , dx 07/2021, prev. DM2, dx in 2000 (had gestational diabetes in 11 in 1992), non-insulin-dependent, uncontrolled, with complications (DR, macular edema). Pt. previously saw Dr. Loanne Drilling, but last visit with me - 3 months ago.  Interim history: No increased urination, blurry vision, nausea, chest pain. She was on Prednisone 40 mg daily x 5 days (for asthma) in the last 2 weeks >> sugars higher. Now off for 7 days. She was not able to tolerate Rybelsus 7 mg daily >> stopped.  Reviewed HbA1c: Lab Results  Component Value Date   HGBA1C 8.7 (A) 07/28/2021   HGBA1C 8.4 (A) 05/12/2021   HGBA1C 8.5 (A) 03/03/2021   HGBA1C 8.7 (H) 02/11/2021   HGBA1C 8.3 (A) 11/26/2020   HGBA1C 8.0 (A) 08/26/2020   HGBA1C 7.3 (A) 03/30/2020   HGBA1C 7.3 (A) 12/24/2019   HGBA1C 7.0 (A) 09/23/2019   HGBA1C 7.0 (A) 07/09/2019   Pt is on a regimen of: - Metformin ER 1000 mg 2x a day - Amaryl 1 mg with breakfast  We tried Rybelsus 3.5 >> 7 mg daily - added 07/2021 >> stopped as she felt sick, tablet stuck in throat, also heartburn. She previously had vaginitis with Jardiance, despite significant improvement in her blood sugars while on it. Invokana was not affordable. She was previously on repaglinide before changing to glimepiride. She tried bromocriptine and Rybelsus and they caused occasional gas pains. She had diarrhea and tremors with Actos. She refused insulin.  Pt checks her sugars 1-2x a day and they are: - am: 146-183 >> 143-185, 292 - 2h after b'fast: 151, 167 >> n/c - before lunch: n/c - 2h after lunch: n/c >> 148 - before dinner: 135 >> n/c - 2h after dinner: 259, 286 (cake) >> n/c - bedtime: n/c >> 141 - nighttime: n/c Lowest sugar was 101 >> 110; she has hypoglycemia awareness at 70.  Highest sugar was 286 > 300s  (Prednisone).  Glucometer: ReliOn  Snacks: oatmeal cookies, regular sodas    - no CKD, last BUN/creatinine:  Lab Results  Component Value Date   BUN 12 02/11/2021   BUN 12 02/10/2020   CREATININE 0.69 02/11/2021   CREATININE 0.63 02/10/2020  She is not on ACE inhibitor/ARB  -+ HL; last set of lipids: Lab Results  Component Value Date   CHOL 239 (H) 02/11/2021   HDL 50.00 02/11/2021   LDLCALC 170 (H) 02/11/2021   LDLDIRECT 152.7 08/26/2012   TRIG 98.0 02/11/2021   CHOLHDL 5 02/11/2021  She is on Livalo 2 mg occasionally due the muscle aches. Prev. On Crestor >> mm aches.  - last eye exam was in 02/2021. + PDR B, + macular edema. Dr. Baird Cancer.  - no numbness and tingling in her feet.  Last foot exam 02/17/2021.  Patient also has a history of GERD, renal stones, asthma, history of SVT, vitamin D deficiency, anxiety.  ROS: + see HPI  Past Medical History:  Diagnosis Date   Adenomatous colon polyp 11/2005   Allergic rhinitis    Allergy    Anomalous atrioventricular excitation    Anxiety    no per pt   Asthma    Diverticulosis of colon    Gastritis    GERD (gastroesophageal reflux disease)    History of supraventricular tachycardia    Hypercholesteremia    Kidney stones  Nephrolithiasis    Renal cyst    Somatic dysfunction    Type II or unspecified type diabetes mellitus without mention of complication, not stated as uncontrolled    Vitamin D deficiency    Past Surgical History:  Procedure Laterality Date   CARDIAC ELECTROPHYSIOLOGY MAPPING AND ABLATION  2004   COLONOSCOPY  2007   TUBAL LIGATION     Social History   Socioeconomic History   Marital status: Married    Spouse name: Not on file   Number of children: 4   Years of education: Not on file   Highest education level: Not on file  Occupational History   Occupation: [employer]  Tobacco Use   Smoking status: Never   Smokeless tobacco: Never  Substance and Sexual Activity   Alcohol use: No    Drug use: No   Sexual activity: Not Currently  Other Topics Concern   Not on file  Social History Narrative   Not on file   Social Determinants of Health   Financial Resource Strain: Low Risk  (03/09/2021)   Overall Financial Resource Strain (CARDIA)    Difficulty of Paying Living Expenses: Not hard at all  Food Insecurity: No Food Insecurity (03/09/2021)   Hunger Vital Sign    Worried About Running Out of Food in the Last Year: Never true    [place] in the Last Year: Never true  Transportation Needs: No Transportation Needs (03/09/2021)   PRAPARE - Hydrologist (Medical): No    Lack of Transportation (Non-Medical): No  Physical Activity: Insufficiently Active (03/09/2021)   Exercise Vital Sign    Days of Exercise per Week: 3 days    Minutes of Exercise per Session: 30 min  Stress: No Stress Concern Present (03/09/2021)   [place]    Feeling of Stress : Not at all  Social Connections: [place] (03/09/2021)   Social Connection and Isolation Panel [NHANES]    Frequency of Communication with Friends and Family: More than three times a week    Frequency of Social Gatherings with Friends and Family: More than three times a week    Attends Religious Services: More than 4 times per year    Active Member of [employer] or Organizations: Yes    Attends Music therapist: More than 4 times per year    Marital Status: Married  Human resources officer Violence: Not At Risk (03/09/2021)   Humiliation, Afraid, Rape, and Kick questionnaire    Fear of Current or Ex-Partner: No    Emotionally Abused: No    Physically Abused: No    Sexually Abused: No   Current Outpatient Medications on File Prior to Visit  Medication Sig Dispense Refill   albuterol (VENTOLIN HFA) 108 (90 Base) MCG/ACT inhaler Inhale 2 puffs into the lungs every 6 (six) hours as needed for wheezing or shortness of breath.  8 g 3   clotrimazole-betamethasone (LOTRISONE) cream Apply 1 application topically 2 (two) times daily. 45 g 2   cyclobenzaprine (FLEXERIL) 10 MG tablet Take 1 tablet (10 mg total) by mouth 3 (three) times daily as needed for muscle spasms. 45 tablet 0   doxycycline (VIBRA-TABS) 100 MG tablet Take 1 tablet (100 mg total) by mouth 2 (two) times daily. 20 tablet 0   esomeprazole (NEXIUM) 20 MG capsule Take 20 mg by mouth daily at 12 noon.     estradiol (ESTRACE VAGINAL) 0.1 MG/GM vaginal  cream Use nightly as directed x 2 weeks; then use 2 x per week for maintenance 42.5 g 11   ezetimibe (ZETIA) 10 MG tablet Take 1 tablet (10 mg total) by mouth daily. 90 tablet 3   fexofenadine ([name]) 180 MG tablet Take 180 mg by mouth daily.     fluconazole (DIFLUCAN) 150 MG tablet Take 1 tablet (150 mg total) by mouth every 3 (three) days. 2 tablet 0   fluticasone (FLONASE) 50 MCG/ACT nasal spray Place 2 sprays into both nostrils daily.     glimepiride (AMARYL) 1 MG tablet Take 1 tablet (1 mg total) by mouth daily with breakfast. 90 tablet 3   metFORMIN (GLUCOPHAGE-XR) 500 MG 24 hr tablet Take 4 tablets (2,000 mg total) by mouth daily with breakfast. 360 tablet 0   nitrofurantoin, macrocrystal-monohydrate, (MACROBID) 100 MG capsule Take 1 capsule (100 mg total) by mouth 2 (two) times daily. 10 capsule 0   Pitavastatin Calcium 2 MG TABS Take 1 tablet (2 mg total) by mouth daily. 90 tablet 3   Semaglutide (RYBELSUS) 7 MG TABS Take 7 mg by mouth daily. (Patient not taking: Reported on 10/12/2021) 90 tablet 3   tobramycin (TOBREX) 0.3 % ophthalmic solution 1 drop 4 (four) times daily.     No current facility-administered medications on file prior to visit.   Allergies  Allergen Reactions   Parlodel [Bromocriptine Mesylate] Nausea Only    nausea   Peanut-Containing Drug Products Hives, Itching and Swelling   Penicillins     REACTION: rash and sob   Pioglitazone Diarrhea    tremor   Sulfa Antibiotics Hives    Zithromax [Azithromycin] Nausea And Vomiting   Family History  Problem Relation Age of Onset   Diabetes Father    Hypertension Father    Hyperlipidemia Father    Breast cancer Mother    Esophageal cancer Neg Hx    Stomach cancer Neg Hx    Colon cancer Neg Hx    Rectal cancer Neg Hx    PE: BP 130/68 (BP Location: Right Arm, Patient Position: Sitting, Cuff Size: Normal)   Pulse 90   Ht '5\' 2"'$  (1.575 m)   Wt 139 lb 6.4 oz (63.2 kg)   SpO2 98%   BMI 25.50 kg/m  Wt Readings from Last 3 Encounters:  11/08/21 139 lb 6.4 oz (63.2 kg)  11/03/21 140 lb (63.5 kg)  10/12/21 140 lb (63.5 kg)   Constitutional: normal weight, in NAD Eyes: no exophthalmos ENT: no thyromegaly, no cervical lymphadenopathy Cardiovascular: RRR, No MRG Respiratory: CTA B Musculoskeletal: no deformities Skin: no rashes Neurological: no tremor with outstretched hands  ASSESSMENT: 1. LADA, non-insulin-dependent, uncontrolled, with complications -Proliferative diabetic retinopathy bilaterally -Macular edema  Component     Latest Ref Rng 07/28/2021  Hemoglobin A1C     4.0 - 5.6 % 8.7 !   Glucose     65 - 99 mg/dL 165 (H)   Islet Cell Ab     Neg:<1:1  Negative   ZNT8 Antibodies     <15 U/mL <10   C-Peptide     0.80 - 3.85 ng/mL 1.78   Glutamic Acid Decarb Ab     <5 IU/mL 10 (H)    She does not have low insulin production, but her GAD antibodies are positive.  This qualifies her for LADA.   2. HL  PLAN:  1. Patient with longstanding, uncontrolled, type 2 diabetes, on oral antidiabetic regimen with metformin ER and sulfonylurea, to which we tried to  add a p.o. GLP-1 receptor agonist at last visit.  At that time, HbA1c was higher, at 8.7%.  Sugars were above target in the morning, more controlled midday and high after dinner, in the upper 200s.  However, she only had few blood sugar checks later in the day and we discussed about rotating the blood sugar checks more.  We also discussed about reducing  dietary indiscretions, especially cookies as snacks and regular sodas.  We continue with metformin and Amaryl and I suggested to add Rybelsus.  At last visit we also checked her insulin production.  Her C-peptide was normal, but she had elevated GAD antibodies.  We discussed that this qualifies her as LADA.  However, due to the robust insulin production, we do not absolutely need to start insulin for now. - at today's visit, she tells me that she was not able to take the 7 mg of Rybelsus despite trying it several times.  However, upon questioning, she did not start at the lower dose, half a tablet daily, as she was advised by the pharmacist not to cut in half.  At today's visit, I advised her that if there is a conflict like this, to reach out to me for further advice.  For now, we decided to try half a tablet daily and see how she does.  Starting the medication at a too high a dose, could this caused her intolerance.  She agrees with the plan. -We also discussed that if she gets steroids again, she may need to double up on her Amaryl dose. -I again advised her to try to stop sweets between meals and regular sodas.  She did start to reduce them. - I suggested to:  Patient Instructions  Please continue: - Metformin ER 1000 mg 2x a day - Amaryl 1 mg  before breakfast (increase to 2 mg if you start back on steroids)  Please retry Rybelsus 3.5 mg daily before b'fast  STOP regular sodas. STOP cookies as snacks.  Please return in 3-4 months with your sugar log.   - we checked her HbA1c: 7.9% (lower) - advised to check sugars at different times of the day - 1x a day, rotating check times - advised for yearly eye exams >> she is UTD - return to clinic in 3-4 months  2. HL -Reviewed latest lipid panel: Very high LDL, the rest of the fractions at goal: Lab Results  Component Value Date   CHOL 239 (H) 02/11/2021   HDL 50.00 02/11/2021   LDLCALC 170 (H) 02/11/2021   LDLDIRECT 152.7 08/26/2012    TRIG 98.0 02/11/2021   CHOLHDL 5 02/11/2021  -  she continues on Livalo 2 mg daily but not daily 2/2 still mm aches.  At last visit, I also suggested Zetia 10 mg daily.  She tolerates these well.  Philemon Kingdom, MD PhD [facility]

## 2021-11-08 NOTE — Patient Instructions (Addendum)
Please continue: - Metformin ER 1000 mg 2x a day - Amaryl 1 mg  before breakfast (increase to 2 mg if you start back on steroids)  Please retry Rybelsus 3.5 mg daily before b'fast  STOP regular sodas. STOP cookies as snacks.  Please return in 3-4 months with your sugar log.

## 2022-01-19 ENCOUNTER — Other Ambulatory Visit: Payer: Self-pay | Admitting: Internal Medicine

## 2022-01-19 DIAGNOSIS — E113593 Type 2 diabetes mellitus with proliferative diabetic retinopathy without macular edema, bilateral: Secondary | ICD-10-CM

## 2022-01-19 DIAGNOSIS — Z1231 Encounter for screening mammogram for malignant neoplasm of breast: Secondary | ICD-10-CM | POA: Diagnosis not present

## 2022-01-19 LAB — HM MAMMOGRAPHY

## 2022-02-24 ENCOUNTER — Ambulatory Visit: Payer: PPO | Admitting: Internal Medicine

## 2022-03-01 ENCOUNTER — Other Ambulatory Visit: Payer: Self-pay

## 2022-03-01 ENCOUNTER — Encounter: Payer: Self-pay | Admitting: Internal Medicine

## 2022-03-01 ENCOUNTER — Ambulatory Visit (INDEPENDENT_AMBULATORY_CARE_PROVIDER_SITE_OTHER): Payer: PPO | Admitting: Internal Medicine

## 2022-03-01 VITALS — BP 120/80 | HR 100 | Temp 98.4°F | Ht 62.0 in | Wt 141.0 lb

## 2022-03-01 DIAGNOSIS — E785 Hyperlipidemia, unspecified: Secondary | ICD-10-CM

## 2022-03-01 DIAGNOSIS — Z0000 Encounter for general adult medical examination without abnormal findings: Secondary | ICD-10-CM

## 2022-03-01 DIAGNOSIS — E113593 Type 2 diabetes mellitus with proliferative diabetic retinopathy without macular edema, bilateral: Secondary | ICD-10-CM

## 2022-03-01 DIAGNOSIS — J4520 Mild intermittent asthma, uncomplicated: Secondary | ICD-10-CM | POA: Diagnosis not present

## 2022-03-01 DIAGNOSIS — K219 Gastro-esophageal reflux disease without esophagitis: Secondary | ICD-10-CM | POA: Diagnosis not present

## 2022-03-01 DIAGNOSIS — E1169 Type 2 diabetes mellitus with other specified complication: Secondary | ICD-10-CM | POA: Diagnosis not present

## 2022-03-01 DIAGNOSIS — E559 Vitamin D deficiency, unspecified: Secondary | ICD-10-CM | POA: Diagnosis not present

## 2022-03-01 LAB — COMPREHENSIVE METABOLIC PANEL
ALT: 17 U/L (ref 0–35)
AST: 15 U/L (ref 0–37)
Albumin: 4.4 g/dL (ref 3.5–5.2)
Alkaline Phosphatase: 69 U/L (ref 39–117)
BUN: 15 mg/dL (ref 6–23)
CO2: 24 mEq/L (ref 19–32)
Calcium: 10.1 mg/dL (ref 8.4–10.5)
Chloride: 103 mEq/L (ref 96–112)
Creatinine, Ser: 0.73 mg/dL (ref 0.40–1.20)
GFR: 85.04 mL/min (ref >60.00)
Glucose, Bld: 140 mg/dL — ABNORMAL HIGH (ref 70–99)
Potassium: 4.3 mEq/L (ref 3.5–5.1)
Sodium: 143 mEq/L (ref 135–145)
Total Bilirubin: 0.4 mg/dL (ref 0.2–1.2)
Total Protein: 7.4 g/dL (ref 6.0–8.3)

## 2022-03-01 LAB — HEMOGLOBIN A1C: Hgb A1c MFr Bld: 8.6 % — ABNORMAL HIGH (ref 4.6–6.5)

## 2022-03-01 LAB — VITAMIN B12: Vitamin B-12: 331 pg/mL (ref 211–911)

## 2022-03-01 LAB — CBC
HCT: 40.8 % (ref 36.0–46.0)
Hemoglobin: 13.6 g/dL (ref 12.0–15.0)
MCHC: 33.3 g/dL (ref 30.0–36.0)
MCV: 88.7 fl (ref 78.0–100.0)
Platelets: 398 10*3/uL (ref 150.0–400.0)
RBC: 4.59 Mil/uL (ref 3.87–5.11)
RDW: 14.2 % (ref 11.5–15.5)
WBC: 8.9 10*3/uL (ref 4.0–10.5)

## 2022-03-01 LAB — LIPID PANEL
Cholesterol: 212 mg/dL — ABNORMAL HIGH (ref 0–200)
HDL: 59.2 mg/dL (ref >39.00)
LDL Cholesterol: 135 mg/dL — ABNORMAL HIGH (ref 0–99)
NonHDL: 153.01
Total CHOL/HDL Ratio: 4
Triglycerides: 91 mg/dL (ref 0.0–149.0)
VLDL: 18.2 mg/dL (ref 0.0–40.0)

## 2022-03-01 LAB — VITAMIN D 25 HYDROXY (VIT D DEFICIENCY, FRACTURES): VITD: 41.01 ng/mL (ref 30.00–100.00)

## 2022-03-01 MED ORDER — ESTRADIOL 0.1 MG/GM VA CREA: TOPICAL_CREAM | VAGINAL | 11 refills | Status: DC

## 2022-03-01 MED ORDER — ESOMEPRAZOLE MAGNESIUM 40 MG PO CPDR: 40.0000 mg | DELAYED_RELEASE_CAPSULE | Freq: Every day | ORAL | 3 refills | Status: DC

## 2022-03-01 NOTE — Progress Notes (Signed)
   Subjective:   Patient ID: Lindsay Henderson, female    DOB: 24-Jan-1955, 67 y.o.   MRN: [id number]  HPI The patient is here for physical.  PMH, [facility], social history reviewed and updated  Review of Systems  Constitutional: Negative.   HENT: Negative.    Eyes: Negative.   Respiratory:  Negative for cough, chest tightness and shortness of breath.   Cardiovascular:  Negative for chest pain, palpitations and leg swelling.  Gastrointestinal:  Negative for abdominal distention, abdominal pain, constipation, diarrhea, nausea and vomiting.  Musculoskeletal: Negative.   Skin: Negative.   Neurological: Negative.   Psychiatric/Behavioral: Negative.      Objective:  Physical Exam Constitutional:      Appearance: She is well-developed.  HENT:     Head: Normocephalic and atraumatic.  Cardiovascular:     Rate and Rhythm: Normal rate and regular rhythm.  Pulmonary:     Effort: Pulmonary effort is normal. No respiratory distress.     Breath sounds: Normal breath sounds. No wheezing or rales.  Abdominal:     General: Bowel sounds are normal. There is no distension.     Palpations: Abdomen is soft.     Tenderness: There is no abdominal tenderness. There is no rebound.  Musculoskeletal:     Cervical back: Normal range of motion.  Skin:    General: Skin is warm and dry.  Neurological:     Mental Status: She is alert and oriented to person, place, and time.     Coordination: Coordination normal.     Vitals:   03/01/22 0816  BP: 120/80  Pulse: 100  Temp: 98.4 F (36.9 C)  TempSrc: Oral  SpO2: 99%  Weight: 141 lb (64 kg)  Height: 5' 2"$  (1.575 m)  EKG: Rate 89, axis normal, interval normal, sinus, no st or t wave changes, low voltage, no significant change compared to prior 2019   Assessment & Plan:

## 2022-03-01 NOTE — Assessment & Plan Note (Signed)
Flu shot declines. Covid-19 counseled. Pneumonia declines. Shingrix declines counseled. Tetanus declines. Colonoscopy up to date. Mammogram up to date, pap smear up to date and dexa up to date. Counseled about sun safety and mole surveillance. Counseled about the dangers of distracted driving. Given 10 year screening recommendations.

## 2022-03-01 NOTE — Assessment & Plan Note (Signed)
Checking lipid panel and adjust pitavastatin 2 mg daily and zetia 10 mg daily as needed. LDL goal <100.

## 2022-03-01 NOTE — Assessment & Plan Note (Signed)
Checking HgA1c, microalbumin to creatinine ratio and CMP. Foot exam done. Reminded about eye exam. Seeing endo for management will let them adjust if needed visit next week.

## 2022-03-01 NOTE — Assessment & Plan Note (Signed)
No flare today, uses albuterol prn and does not need refill today.

## 2022-03-01 NOTE — Patient Instructions (Signed)
We will check the labs today. 

## 2022-03-01 NOTE — Assessment & Plan Note (Signed)
With worsening GERD symptoms of morning hoarseness. Rx nexium 40 mg daily increase from 20 mg nexium daily.

## 2022-03-01 NOTE — Assessment & Plan Note (Signed)
Checking vitamin D and adjust as needed.

## 2022-03-02 ENCOUNTER — Encounter: Payer: Self-pay | Admitting: Internal Medicine

## 2022-03-02 LAB — MICROALBUMIN / CREATININE URINE RATIO
Creatinine,U: 79.9 mg/dL
Microalb Creat Ratio: 2.4 mg/g (ref 0.0–30.0)
Microalb, Ur: 1.9 mg/dL (ref 0.0–1.9)

## 2022-03-09 ENCOUNTER — Ambulatory Visit: Payer: PPO | Admitting: Internal Medicine

## 2022-03-09 ENCOUNTER — Encounter: Payer: Self-pay | Admitting: Internal Medicine

## 2022-03-09 VITALS — BP 130/82 | HR 89 | Ht 62.0 in | Wt 139.6 lb

## 2022-03-09 DIAGNOSIS — E11319 Type 2 diabetes mellitus with unspecified diabetic retinopathy without macular edema: Secondary | ICD-10-CM | POA: Diagnosis not present

## 2022-03-09 DIAGNOSIS — E785 Hyperlipidemia, unspecified: Secondary | ICD-10-CM | POA: Diagnosis not present

## 2022-03-09 DIAGNOSIS — E1169 Type 2 diabetes mellitus with other specified complication: Secondary | ICD-10-CM

## 2022-03-09 DIAGNOSIS — E13319 Other specified diabetes mellitus with unspecified diabetic retinopathy without macular edema: Secondary | ICD-10-CM

## 2022-03-09 NOTE — Patient Instructions (Addendum)
Please continue: - Metformin ER 1000 mg 2x a day - Amaryl 1 mg  before breakfast (2 mg while on steroids) - Rybelsus 7 mg daily before b'fast  Please return in 2 months with your sugar log.

## 2022-03-09 NOTE — Progress Notes (Signed)
Patient ID: Lindsay Henderson, female   DOB: 1955/11/24, 67 y.o.   MRN: [id number]  HPI: Lindsay Henderson is a 67 y.o.-year-old female, returning for follow-up for LADA , dx 07/2021, prev. DM2, dx in 2000 (had gestational diabetes in 57 in 1992), non-insulin-dependent, uncontrolled, with complications (DR, macular edema). Pt. previously saw Dr. Loanne Drilling, but last visit with me 4 months ago.  Interim history: No increased urination, blurry vision, nausea, chest pain. She relaxed her diet over the Holidays - more sweets. She had acid reflux exacerbated by Rybelsus  - now on a higher PPI dose (Nexiu 40 mg daily).  Reviewed HbA1c: Lab Results  Component Value Date   HGBA1C 8.6 (H) 03/01/2022   HGBA1C 7.9 (A) 11/08/2021   HGBA1C 8.7 (A) 07/28/2021   HGBA1C 8.4 (A) 05/12/2021   HGBA1C 8.5 (A) 03/03/2021   HGBA1C 8.7 (H) 02/11/2021   HGBA1C 8.3 (A) 11/26/2020   HGBA1C 8.0 (A) 08/26/2020   HGBA1C 7.3 (A) 03/30/2020   HGBA1C 7.3 (A) 12/24/2019   Pt is on a regimen of: - Metformin ER 1000 mg 2x a day - Amaryl 1-2 mg with breakfast (higher dose with steroids) We tried Rybelsus 3.5 >> 7 mg daily - added 07/2021 >> stopped as she felt sick, tablet stuck in throat, also heartburn >> 3.5 >> 7 mg daily She previously had vaginitis with Jardiance, despite significant improvement in her blood sugars while on it. Invokana was not affordable. She was previously on repaglinide before changing to glimepiride. She tried bromocriptine and Rybelsus and they caused occasional gas pains. She had diarrhea and tremors with Actos. She refused insulin.  Pt checks her sugars 1-2x a day and they are: - am: 146-183 >> 143-185, 292 >> 140-150 - 2h after b'fast: 151, 167 >> n/c - before lunch: n/c  - 2h after lunch: n/c >> 148 >> n/c >> 150-170 - before dinner: 135 >> n/c >> 150-170 - 2h after dinner: 259, 286 (cake) >> n/c >> 170-180 - bedtime: n/c >> 141  >> 180-200 - nighttime: n/c Lowest sugar was 101  >> 110 >> 80s (started Rybelsus); she has hypoglycemia awareness at 70.  Highest sugar was 286 > 300s (Prednisone) >> 325  Glucometer: ReliOn  - no CKD, last BUN/creatinine:  Lab Results  Component Value Date   BUN 15 03/01/2022   BUN 12 02/11/2021   CREATININE 0.73 03/01/2022   CREATININE 0.69 02/11/2021  She is not on ACE inhibitor/ARB  -+ HL; last set of lipids: Lab Results  Component Value Date   CHOL 212 (H) 03/01/2022   HDL 59.20 03/01/2022   LDLCALC 135 (H) 03/01/2022   LDLDIRECT 152.7 08/26/2012   TRIG 91.0 03/01/2022   CHOLHDL 4 03/01/2022  She is on Livalo 2 mg occasionally due the muscle aches. Prev. On Crestor >> mm aches.  She restarted Zetia 10 mg daily 02/2022.  - last eye exam was in 02/2021. + PDR B, + macular edema. Dr. Baird Cancer. She just saw retina specialist >> will see him again in 6 months.  - no numbness and tingling in her feet.  Last foot exam 03/01/2022.  Patient also has a history of GERD, renal stones, asthma, history of SVT, vitamin D deficiency, anxiety.  ROS: + see HPI  Past Medical History:  Diagnosis Date   Adenomatous colon polyp 11/2005   Allergic rhinitis    Allergy    Anomalous atrioventricular excitation    Anxiety    no per pt   Asthma  Diverticulosis of colon    Gastritis    GERD (gastroesophageal reflux disease)    History of supraventricular tachycardia    Hypercholesteremia    Kidney stones    Nephrolithiasis    Renal cyst    Somatic dysfunction    Type II or unspecified type diabetes mellitus without mention of complication, not stated as uncontrolled    Vitamin D deficiency    Past Surgical History:  Procedure Laterality Date   CARDIAC ELECTROPHYSIOLOGY MAPPING AND ABLATION  2004   COLONOSCOPY  2007   TUBAL LIGATION     Social History   Socioeconomic History   Marital status: Married    Spouse name: Not on file   Number of children: 4   Years of education: Not on file   Highest education level: Not on  file  Occupational History   Occupation: [employer]  Tobacco Use   Smoking status: Never   Smokeless tobacco: Never  Substance and Sexual Activity   Alcohol use: No   Drug use: No   Sexual activity: Not Currently  Other Topics Concern   Not on file  Social History Narrative   Not on file   Social Determinants of Health   Financial Resource Strain: Low Risk  (03/09/2021)   Overall Financial Resource Strain (CARDIA)    Difficulty of Paying Living Expenses: Not hard at all  Food Insecurity: No Food Insecurity (03/09/2021)   Hunger Vital Sign    Worried About Running Out of Food in the Last Year: Never true    Ran Out of Food in the Last Year: Never true  Transportation Needs: No Transportation Needs (03/09/2021)   PRAPARE - Hydrologist (Medical): No    Lack of Transportation (Non-Medical): No  Physical Activity: Insufficiently Active (03/09/2021)   Exercise Vital Sign    Days of Exercise per Week: 3 days    Minutes of Exercise per Session: 30 min  Stress: No Stress Concern Present (03/09/2021)   [place]    Feeling of Stress : Not at all  Social Connections: [place] (03/09/2021)   Social Connection and Isolation Panel [NHANES]    Frequency of Communication with Friends and Family: More than three times a week    Frequency of Social Gatherings with Friends and Family: More than three times a week    Attends Religious Services: More than 4 times per year    Active Member of [employer] or Organizations: Yes    Attends Music therapist: More than 4 times per year    Marital Status: Married  Human resources officer Violence: Not At Risk (03/09/2021)   Humiliation, Afraid, Rape, and Kick questionnaire    Fear of Current or Ex-Partner: No    Emotionally Abused: No    Physically Abused: No    Sexually Abused: No   Current Outpatient Medications on File Prior to Visit   Medication Sig Dispense Refill   albuterol (VENTOLIN HFA) 108 (90 Base) MCG/ACT inhaler Inhale 2 puffs into the lungs every 6 (six) hours as needed for wheezing or shortness of breath. 8 g 3   clotrimazole-betamethasone (LOTRISONE) cream Apply 1 application topically 2 (two) times daily. 45 g 2   cyclobenzaprine (FLEXERIL) 10 MG tablet Take 1 tablet (10 mg total) by mouth 3 (three) times daily as needed for muscle spasms. 45 tablet 0   doxycycline (VIBRA-TABS) 100 MG tablet Take 1 tablet (100 mg total) by mouth  2 (two) times daily. 20 tablet 0   esomeprazole (NEXIUM) 40 MG capsule Take 1 capsule (40 mg total) by mouth daily. 90 capsule 3   estradiol (ESTRACE VAGINAL) 0.1 MG/GM vaginal cream Use nightly as directed x 2 weeks; then use 2 x per week for maintenance 42.5 g 11   ezetimibe (ZETIA) 10 MG tablet Take 1 tablet (10 mg total) by mouth daily. 90 tablet 3   fexofenadine ([name]) 180 MG tablet Take 180 mg by mouth daily.     fluconazole (DIFLUCAN) 150 MG tablet Take 1 tablet (150 mg total) by mouth every 3 (three) days. 2 tablet 0   fluticasone (FLONASE) 50 MCG/ACT nasal spray Place 2 sprays into both nostrils daily.     glimepiride (AMARYL) 1 MG tablet Take 1 tablet (1 mg total) by mouth daily with breakfast. 90 tablet 3   metFORMIN (GLUCOPHAGE-XR) 500 MG 24 hr tablet TAKE 4 TABLETS BY MOUTH ONCE DAILY WITH BREAKFAST 360 tablet 0   nitrofurantoin, macrocrystal-monohydrate, (MACROBID) 100 MG capsule Take 1 capsule (100 mg total) by mouth 2 (two) times daily. 10 capsule 0   Pitavastatin Calcium 2 MG TABS Take 1 tablet (2 mg total) by mouth daily. 90 tablet 3   Semaglutide (RYBELSUS) 7 MG TABS Take 7 mg by mouth daily. 90 tablet 3   tobramycin (TOBREX) 0.3 % ophthalmic solution 1 drop 4 (four) times daily.     No current facility-administered medications on file prior to visit.   Allergies  Allergen Reactions   Parlodel [Bromocriptine Mesylate] Nausea Only    nausea   Peanut-Containing  Drug Products Hives, Itching and Swelling   Penicillins     REACTION: rash and sob   Pioglitazone Diarrhea    tremor   Sulfa Antibiotics Hives   Zithromax [Azithromycin] Nausea And Vomiting   Family History  Problem Relation Age of Onset   Diabetes Father    Hypertension Father    Hyperlipidemia Father    Breast cancer Mother    Esophageal cancer Neg Hx    Stomach cancer Neg Hx    Colon cancer Neg Hx    Rectal cancer Neg Hx    PE: BP 130/82 (BP Location: Right Arm, Patient Position: Sitting, Cuff Size: Normal)   Pulse 89   Ht '5\' 2"'$  (1.575 m)   Wt 139 lb 9.6 oz (63.3 kg)   SpO2 98%   BMI 25.53 kg/m  Wt Readings from Last 3 Encounters:  03/09/22 139 lb 9.6 oz (63.3 kg)  03/01/22 141 lb (64 kg)  11/08/21 139 lb 6.4 oz (63.2 kg)   Constitutional: normal weight, in NAD Eyes: no exophthalmos ENT: no thyromegaly, no cervical lymphadenopathy Cardiovascular: RRR, No MRG Respiratory: CTA B Musculoskeletal: no deformities Skin: no rashes Neurological: no tremor with outstretched hands  ASSESSMENT: 1. LADA, non-insulin-dependent, uncontrolled, with complications -Proliferative diabetic retinopathy bilaterally -Macular edema  Component     Latest Ref Rng 07/28/2021  Hemoglobin A1C     4.0 - 5.6 % 8.7 !   Glucose     65 - 99 mg/dL 165 (H)   Islet Cell Ab     Neg:<1:1  Negative   ZNT8 Antibodies     <15 U/mL <10   C-Peptide     0.80 - 3.85 ng/mL 1.78   Glutamic Acid Decarb Ab     <5 IU/mL 10 (H)    She does not have low insulin production, but her GAD antibodies are positive.  This qualifies her for LADA.  2. HL  PLAN:  1. Patient with longstanding, uncontrolled, type 2 diabetes, regimen with metformin ER, sulfonylurea, to which we tried to add a p.o. GLP-1 receptor agonist.  HbA1c at last visit still elevated, but improved from before, at 7.9%.  Since then, she had another HbA1c a week ago and this was higher, at 8.6%. -Of note, patient has a diagnosis of LADA.   Her C-peptide was normal, but she had elevated GAD antibodies.  Due to the robust insulin production, we did not add insulin yet. -At last visit, she was not able to obtain Rybelsus 7 mg dose despite trying it several times.  We discussed about retrying this.  I also advised her that if she got steroids again, to double up on the Amaryl dose.  I strongly advised her to stop sweets between meals and regular sodas. - at today's visit, she mentions she relaxed her  diet over the [place], but now after the last HbA1c returned, she is now again working on her diet. Also, she restarted Rybelsus and sugars immediately improved. However, she takes a high dose of PPIs with it. For now, will continue this.  - I suggested to:  Patient Instructions  Please continue: - Metformin ER 1000 mg 2x a day - Amaryl 1 mg  before breakfast (2 mg while on steroids) - Rybelsus 7 mg daily before b'fast  Please return in 2 months with your sugar log.   - advised to check sugars at different times of the day - 1x a day, rotating check times - advised for yearly eye exams >> she is UTD - return to clinic in 3-4 months  2. HL -Reviewed latest lipid panel from 02/2022: LDL still above target: Lab Results  Component Value Date   CHOL 212 (H) 03/01/2022   HDL 59.20 03/01/2022   LDLCALC 135 (H) 03/01/2022   LDLDIRECT 152.7 08/26/2012   TRIG 91.0 03/01/2022   CHOLHDL 4 03/01/2022  -She continues on Livalo 2 mg but not quite daily due to muscle aches.  She  also started Zetia 10 mg daily  this mo.  She tolerates this well.  Philemon Kingdom, MD PhD [facility]

## 2022-03-29 ENCOUNTER — Ambulatory Visit (INDEPENDENT_AMBULATORY_CARE_PROVIDER_SITE_OTHER): Payer: PPO

## 2022-03-29 VITALS — Ht 62.0 in | Wt 139.0 lb

## 2022-03-29 DIAGNOSIS — Z13820 Encounter for screening for osteoporosis: Secondary | ICD-10-CM | POA: Diagnosis not present

## 2022-03-29 DIAGNOSIS — Z0000 Encounter for general adult medical examination without abnormal findings: Secondary | ICD-10-CM

## 2022-03-29 NOTE — Progress Notes (Signed)
I connected with  [facility] on 03/29/22 by a audio enabled telemedicine application and verified that I am speaking with the correct person using two identifiers.  Patient Location: Home  Provider Location: Office/Clinic  I discussed the limitations of evaluation and management by telemedicine. The patient expressed understanding and agreed to proceed.  Subjective:   Lindsay ISHIHARA is a 67 y.o. female who presents for Medicare Annual (Subsequent) preventive examination.  Review of Systems     Cardiac Risk Factors include: advanced age (>62men, >31 women);diabetes mellitus;dyslipidemia;family history of premature cardiovascular disease     Objective:    Today's Vitals   03/29/22 0917  Weight: 139 lb (63 kg)  Height: 5\' 2"  (1.575 m)  PainSc: 0-No pain   Body mass index is 25.42 kg/m.     03/29/2022    9:19 AM 03/09/2021    8:46 AM 04/29/2015    2:34 PM 06/25/2014    9:00 AM 06/11/2014    3:41 PM  Advanced Directives  Does Patient Have a Medical Advance Directive? No No No No No  Would patient like information on creating a medical advance directive? No - Patient declined No - Patient declined No - patient declined information      Current Medications (verified) Outpatient Encounter Medications as of 03/29/2022  Medication Sig   albuterol (VENTOLIN HFA) 108 (90 Base) MCG/ACT inhaler Inhale 2 puffs into the lungs every 6 (six) hours as needed for wheezing or shortness of breath.   clotrimazole-betamethasone (LOTRISONE) cream Apply 1 application topically 2 (two) times daily.   cyclobenzaprine (FLEXERIL) 10 MG tablet Take 1 tablet (10 mg total) by mouth 3 (three) times daily as needed for muscle spasms.   doxycycline (VIBRA-TABS) 100 MG tablet Take 1 tablet (100 mg total) by mouth 2 (two) times daily.   esomeprazole (NEXIUM) 40 MG capsule Take 1 capsule (40 mg total) by mouth daily.   estradiol (ESTRACE VAGINAL) 0.1 MG/GM vaginal cream Use nightly as directed x 2  weeks; then use 2 x per week for maintenance   ezetimibe (ZETIA) 10 MG tablet Take 1 tablet (10 mg total) by mouth daily.   fexofenadine ([name]) 180 MG tablet Take 180 mg by mouth daily.   fluconazole (DIFLUCAN) 150 MG tablet Take 1 tablet (150 mg total) by mouth every 3 (three) days.   fluticasone (FLONASE) 50 MCG/ACT nasal spray Place 2 sprays into both nostrils daily.   glimepiride (AMARYL) 1 MG tablet Take 1 tablet (1 mg total) by mouth daily with breakfast.   metFORMIN (GLUCOPHAGE-XR) 500 MG 24 hr tablet TAKE 4 TABLETS BY MOUTH ONCE DAILY WITH BREAKFAST   nitrofurantoin, macrocrystal-monohydrate, (MACROBID) 100 MG capsule Take 1 capsule (100 mg total) by mouth 2 (two) times daily.   Pitavastatin Calcium 2 MG TABS Take 1 tablet (2 mg total) by mouth daily.   Semaglutide (RYBELSUS) 7 MG TABS Take 7 mg by mouth daily.   tobramycin (TOBREX) 0.3 % ophthalmic solution 1 drop 4 (four) times daily.   No facility-administered encounter medications on file as of 03/29/2022.    Allergies (verified) Parlodel [bromocriptine mesylate], Peanut-containing drug products, Penicillins, Pioglitazone, Sulfa antibiotics, and Zithromax [azithromycin]   History: Past Medical History:  Diagnosis Date   Adenomatous colon polyp 11/2005   Allergic rhinitis    Allergy    Anomalous atrioventricular excitation    Anxiety    no per pt   Asthma    Diverticulosis of colon    Gastritis    GERD (gastroesophageal  reflux disease)    History of supraventricular tachycardia    Hypercholesteremia    Kidney stones    Nephrolithiasis    Renal cyst    Somatic dysfunction    Type II or unspecified type diabetes mellitus without mention of complication, not stated as uncontrolled    Vitamin D deficiency    Past Surgical History:  Procedure Laterality Date   CARDIAC ELECTROPHYSIOLOGY MAPPING AND ABLATION  2004   COLONOSCOPY  2007   TUBAL LIGATION     Family History  Problem Relation Age of Onset   Diabetes  Father    Hypertension Father    Hyperlipidemia Father    Breast cancer Mother    Esophageal cancer Neg Hx    Stomach cancer Neg Hx    Colon cancer Neg Hx    Rectal cancer Neg Hx    Social History   Socioeconomic History   Marital status: Married    Spouse name: Not on file   Number of children: 4   Years of education: Not on file   Highest education level: Not on file  Occupational History   Occupation: [employer]  Tobacco Use   Smoking status: Never   Smokeless tobacco: Never  Substance and Sexual Activity   Alcohol use: No   Drug use: No   Sexual activity: Not Currently  Other Topics Concern   Not on file  Social History Narrative   Not on file   Social Determinants of Health   Financial Resource Strain: Low Risk  (03/29/2022)   Overall Financial Resource Strain (CARDIA)    Difficulty of Paying Living Expenses: Not hard at all  Food Insecurity: No Food Insecurity (03/29/2022)   Hunger Vital Sign    Worried About Running Out of Food in the Last Year: Never true    [place] in the Last Year: Never true  Transportation Needs: No Transportation Needs (03/29/2022)   PRAPARE - Hydrologist (Medical): No    Lack of Transportation (Non-Medical): No  Physical Activity: Sufficiently Active (03/29/2022)   Exercise Vital Sign    Days of Exercise per Week: 5 days    Minutes of Exercise per Session: 30 min  Stress: No Stress Concern Present (03/29/2022)   [place]    Feeling of Stress : Not at all  Social Connections: [place] (03/29/2022)   Social Connection and Isolation Panel [NHANES]    Frequency of Communication with Friends and Family: More than three times a week    Frequency of Social Gatherings with Friends and Family: More than three times a week    Attends Religious Services: More than 4 times per year    Active Member of [employer] or Organizations: Yes     Attends Music therapist: More than 4 times per year    Marital Status: Married    Tobacco Counseling Counseling given: Not Answered   Clinical Intake:  Pre-visit preparation completed: Yes  Pain : No/denies pain Pain Score: 0-No pain     BMI - recorded: 25.42 Nutritional Status: BMI 25 -29 Overweight Nutritional Risks: None Diabetes: Yes CBG done?: No Did pt. bring in CBG monitor from home?: No  How often do you need to have someone help you when you read instructions, pamphlets, or other written materials from your doctor or pharmacy?: 1 - Never What is the last grade level you completed in school?: HSG  Nutrition Risk  Assessment:  Has the patient had any N/V/D within the last 2 months?  No  Does the patient have any non-healing wounds?  No  Has the patient had any unintentional weight loss or weight gain?  No   Diabetes:  Is the patient diabetic?  Yes  If diabetic, was a CBG obtained today?  No  Did the patient bring in their glucometer from home?  No  How often do you monitor your CBG's? Up to 3 times per day.   Financial Strains and Diabetes Management:  Are you having any financial strains with the device, your supplies or your medication? No .  Does the patient want to be seen by Chronic Care Management for management of their diabetes?  No  Would the patient like to be referred to a Nutritionist or for Diabetic Management?  No   Diabetic Exams:  Diabetic Eye Exam: Completed 08/30/2021 Diabetic Foot Exam: Completed 03/01/2022   Interpreter Needed?: No  Information entered by :: Lisette Abu, LPN.   Activities of Daily Living    03/29/2022    9:23 AM  In your present state of health, do you have any difficulty performing the following activities:  Hearing? 0  Vision? 0  Difficulty concentrating or making decisions? 0  Walking or climbing stairs? 0  Dressing or bathing? 0  Doing errands, shopping? 0  Preparing Food and eating  ? N  Using the Toilet? N  In the past six months, have you accidently leaked urine? N  Do you have problems with loss of bowel control? N  Managing your Medications? N  Managing your Finances? N  Housekeeping or managing your Housekeeping? N    Patient Care Team: Hoyt Koch, MD as PCP - General (Internal Medicine) Servando Salina, MD as Consulting Physician (Obstetrics and Gynecology) Thalia Bloodgood, Sharon as Referring Physician (Optometry) Sherlynn Stalls, MD as Consulting Physician (Ophthalmology)  Indicate any recent Medical Services you may have received from other than [facility] providers in the past year (date may be approximate).     Assessment:   This is a routine wellness examination for Lillynn.  Hearing/Vision screen Hearing Screening - Comments:: Denies hearing difficulties   Vision Screening - Comments:: Wears rx glasses - up to date with routine eye exams with [facility] Dr. Sherlynn Stalls at [place] ([id number])  Dietary issues and exercise activities discussed: Current Exercise Habits: Home exercise routine, Type of exercise: walking;Other - see comments (treadmill), Time (Minutes): 30, Frequency (Times/Week): 5, Weekly Exercise (Minutes/Week): 150, Intensity: Moderate, Exercise limited by: respiratory conditions(s)   Goals Addressed   None   Depression Screen    03/29/2022    9:20 AM 03/01/2022    8:18 AM 08/12/2021    2:18 PM 03/09/2021    8:48 AM 02/17/2021   10:23 AM 02/10/2020    8:13 AM 12/19/2018    8:05 AM  PHQ 2/9 Scores  PHQ - 2 Score 0 0 0 0 0 0 0  PHQ- 9 Score 0 0         Fall Risk    03/29/2022    9:20 AM 03/01/2022    8:15 AM 03/09/2021    8:47 AM 02/17/2021   10:23 AM 02/10/2020    8:13 AM  Fall Risk   Falls in the past year? 0 0 0 0 0  Number falls in past yr: 0 0 0 0 0  Injury with Fall? 0 0 0 0 0  Risk for fall due to :  No Fall Risks  No Fall Risks    Follow up Falls prevention discussed Falls evaluation completed  Falls evaluation completed      [place]:  Any stairs in or around the home? Yes  If so, are there any without handrails? No  Home free of loose throw rugs in walkways, pet beds, electrical cords, etc? Yes  Adequate lighting in your home to reduce risk of falls? Yes   ASSISTIVE DEVICES UTILIZED TO PREVENT FALLS:  Life alert? No  Use of a cane, walker or w/c? No  Grab bars in the bathroom? No  Shower chair or bench in shower? No  Elevated toilet seat or a handicapped toilet? Yes   TIMED UP AND GO:  Was the test performed? No . Telephonic Visit  Cognitive Function:        03/29/2022    9:23 AM  6CIT Screen  What Year? 0 points  What month? 0 points  What time? 0 points  Count back from 20 0 points  Months in reverse 0 points  Repeat phrase 0 points  Total Score 0 points    Immunizations Immunization History  Administered Date(s) Administered   Influenza Whole 10/18/2009   Influenza-Unspecified 10/15/2014   PFIZER(Purple Top)SARS-COV-2 Vaccination 03/27/2019, 04/24/2019    TDAP status: Declined  Flu Vaccine status: Declined, Education has been provided regarding the importance of this vaccine but patient still declined. Advised may receive this vaccine at local pharmacy or Health Dept. Aware to provide a copy of the vaccination record if obtained from local pharmacy or Health Dept. Verbalized acceptance and understanding.  Pneumococcal vaccine status: Declined,  Education has been provided regarding the importance of this vaccine but patient still declined. Advised may receive this vaccine at local pharmacy or Health Dept. Aware to provide a copy of the vaccination record if obtained from local pharmacy or Health Dept. Verbalized acceptance and understanding.   Covid-19 vaccine status: Completed vaccines  Qualifies for Shingles Vaccine? Yes   Zostavax completed No   Shingrix Completed?: No.    Education has been provided regarding  the importance of this vaccine. Patient has been advised to call insurance company to determine out of pocket expense if they have not yet received this vaccine. Advised may also receive vaccine at local pharmacy or Health Dept. Verbalized acceptance and understanding.  Screening Tests Health Maintenance  Topic Date Due   Hepatitis C Screening  Never done   DTaP/Tdap/Td (1 - Tdap) Never done   Zoster Vaccines- Shingrix (1 of 2) Never done   Pneumonia Vaccine 16+ Years old (1 of 1 - PCV) Never done   COVID-19 Vaccine (3 - 2023-24 season) 09/09/2021   HEMOGLOBIN A1C  08/30/2022   OPHTHALMOLOGY EXAM  08/31/2022   Diabetic kidney evaluation - eGFR measurement  03/02/2023   Diabetic kidney evaluation - Urine ACR  03/02/2023   FOOT EXAM  03/02/2023   Medicare Annual Wellness (AWV)  03/29/2023   MAMMOGRAM  01/20/2024   COLONOSCOPY (Pts 45-63yrs Insurance coverage will need to be confirmed)  06/24/2024   DEXA SCAN  Completed   HPV VACCINES  Aged Out   INFLUENZA VACCINE  Discontinued    Health Maintenance  Health Maintenance Due  Topic Date Due   Hepatitis C Screening  Never done   DTaP/Tdap/Td (1 - Tdap) Never done   Zoster Vaccines- Shingrix (1 of 2) Never done   Pneumonia Vaccine 69+ Years old (1 of 1 - PCV) Never done  COVID-19 Vaccine (3 - 2023-24 season) 09/09/2021    Colorectal cancer screening: Type of screening: Colonoscopy. Completed 06/25/2014. Repeat every 10 years  Mammogram status: Completed 01/19/2022. Repeat every year  Bone Density status: Ordered 03/29/2022. Pt provided with contact info and advised to call to schedule appt.  Lung Cancer Screening: (Low Dose CT Chest recommended if Age 51-80 years, 30 pack-year currently smoking OR have quit w/in 15years.) does not qualify.   Lung Cancer Screening Referral: no  Additional Screening:  Hepatitis C Screening: does qualify; Completed no  Vision Screening: Recommended annual ophthalmology exams for early detection of  glaucoma and other disorders of the eye. Is the patient up to date with their annual eye exam?  Yes  Who is the provider or what is the name of the office in which the patient attends annual eye exams? [facility] If pt is not established with a provider, would they like to be referred to a provider to establish care? No .   Dental Screening: Recommended annual dental exams for proper oral hygiene  Community Resource Referral / Chronic Care Management: CRR required this visit?  No   CCM required this visit?  No      Plan:     I have personally reviewed and noted the following in the patient's chart:   Medical and social history Use of alcohol, tobacco or illicit drugs  Current medications and supplements including opioid prescriptions. Patient is not currently taking opioid prescriptions. Functional ability and status Nutritional status Physical activity Advanced directives List of other physicians Hospitalizations, surgeries, and ER visits in previous 12 months Vitals Screenings to include cognitive, depression, and falls Referrals and appointments  In addition, I have reviewed and discussed with patient certain preventive protocols, quality metrics, and best practice recommendations. A written personalized care plan for preventive services as well as general preventive health recommendations were provided to patient.     Sheral Flow, LPN   [id number]   Nurse Notes: Normal cognitive status assessed by direct observation by this Nurse Health Advisor. No abnormalities found.

## 2022-03-29 NOTE — Patient Instructions (Addendum)
Ms. Lindsay Henderson , Thank you for taking time to come for your Medicare Wellness Visit. I appreciate your ongoing commitment to your health goals. Please review the following plan we discussed and let me know if I can assist you in the future.   These are the goals we discussed:  Goals      Client will verbalize knowledge of diabetes self-management as evidenced by Hgb A1C <7 or as defined by provider.     My goal is to lower my HgA1C and cholesterol by increasing my physical activity and watching my diet.        This is a list of the screening recommended for you and due dates:  Health Maintenance  Topic Date Due   Hepatitis C Screening: USPSTF Recommendation to screen - Ages 50-79 yo.  Never done   DTaP/Tdap/Td vaccine (1 - Tdap) Never done   Zoster (Shingles) Vaccine (1 of 2) Never done   Pneumonia Vaccine (1 of 1 - PCV) Never done   COVID-19 Vaccine (3 - 2023-24 season) 09/09/2021   Hemoglobin A1C  08/30/2022   Eye exam for diabetics  08/31/2022   Yearly kidney function blood test for diabetes  03/02/2023   Yearly kidney health urinalysis for diabetes  03/02/2023   Complete foot exam   03/02/2023   Medicare Annual Wellness Visit  03/29/2023   Mammogram  01/20/2024   Colon Cancer Screening  06/24/2024   DEXA scan (bone density measurement)  Completed   HPV Vaccine  Aged Out   Flu Shot  Discontinued    Advanced directives: No  Conditions/risks identified: Yes, Type II Diabetes Mellitus  Next appointment: Follow up in one year for your annual wellness visit.   Preventive Care 3 Years and Older, Female Preventive care refers to lifestyle choices and visits with your health care provider that can promote health and wellness. What does preventive care include? A yearly physical exam. This is also called an annual well check. Dental exams once or twice a year. Routine eye exams. Ask your health care provider how often you should have your eyes checked. Personal lifestyle  choices, including: Daily care of your teeth and gums. Regular physical activity. Eating a healthy diet. Avoiding tobacco and drug use. Limiting alcohol use. Practicing safe sex. Taking low-dose aspirin every day. Taking vitamin and mineral supplements as recommended by your health care provider. What happens during an annual well check? The services and screenings done by your health care provider during your annual well check will depend on your age, overall health, lifestyle risk factors, and family history of disease. Counseling  Your health care provider may ask you questions about your: Alcohol use. Tobacco use. Drug use. Emotional well-being. Home and relationship well-being. Sexual activity. Eating habits. History of falls. Memory and ability to understand (cognition). Work and work Statistician. Reproductive health. Screening  You may have the following tests or measurements: Height, weight, and BMI. Blood pressure. Lipid and cholesterol levels. These may be checked every 5 years, or more frequently if you are over 70 years old. Skin check. Lung cancer screening. You may have this screening every year starting at age 107 if you have a 30-pack-year history of smoking and currently smoke or have quit within the past 15 years. Fecal occult blood test (FOBT) of the stool. You may have this test every year starting at age 31. Flexible sigmoidoscopy or colonoscopy. You may have a sigmoidoscopy every 5 years or a colonoscopy every 10 years starting at age 82.  Hepatitis C blood test. Hepatitis B blood test. Sexually transmitted disease (STD) testing. Diabetes screening. This is done by checking your blood sugar (glucose) after you have not eaten for a while (fasting). You may have this done every 1-3 years. Bone density scan. This is done to screen for osteoporosis. You may have this done starting at age 62. Mammogram. This may be done every 1-2 years. Talk to your health care  provider about how often you should have regular mammograms. Talk with your health care provider about your test results, treatment options, and if necessary, the need for more tests. Vaccines  Your health care provider may recommend certain vaccines, such as: Influenza vaccine. This is recommended every year. Tetanus, diphtheria, and acellular pertussis (Tdap, Td) vaccine. You may need a Td booster every 10 years. Zoster vaccine. You may need this after age 57. Pneumococcal 13-valent conjugate (PCV13) vaccine. One dose is recommended after age 29. Pneumococcal polysaccharide (PPSV23) vaccine. One dose is recommended after age 5. Talk to your health care provider about which screenings and vaccines you need and how often you need them. This information is not intended to replace advice given to you by your health care provider. Make sure you discuss any questions you have with your health care provider. Document Released: 01/22/2015 Document Revised: 09/15/2015 Document Reviewed: 10/27/2014 Elsevier Interactive Patient Education  2017 [place] Prevention in the Home Falls can cause injuries. They can happen to people of all ages. There are many things you can do to make your home safe and to help prevent falls. What can I do on the outside of my home? Regularly fix the edges of walkways and driveways and fix any cracks. Remove anything that might make you trip as you walk through a door, such as a raised step or threshold. Trim any bushes or trees on the path to your home. Use bright outdoor lighting. Clear any walking paths of anything that might make someone trip, such as rocks or tools. Regularly check to see if handrails are loose or broken. Make sure that both sides of any steps have handrails. Any raised decks and porches should have guardrails on the edges. Have any leaves, snow, or ice cleared regularly. Use sand or salt on walking paths during winter. Clean up any  spills in your garage right away. This includes oil or grease spills. What can I do in the bathroom? Use night lights. Install grab bars by the toilet and in the tub and shower. Do not use towel bars as grab bars. Use non-skid mats or decals in the tub or shower. If you need to sit down in the shower, use a plastic, non-slip stool. Keep the floor dry. Clean up any water that spills on the floor as soon as it happens. Remove soap buildup in the tub or shower regularly. Attach bath mats securely with double-sided non-slip rug tape. Do not have throw rugs and other things on the floor that can make you trip. What can I do in the bedroom? Use night lights. Make sure that you have a light by your bed that is easy to reach. Do not use any sheets or blankets that are too big for your bed. They should not hang down onto the floor. Have a firm chair that has side arms. You can use this for support while you get dressed. Do not have throw rugs and other things on the floor that can make you trip. What can I do in  the kitchen? Clean up any spills right away. Avoid walking on wet floors. Keep items that you use a lot in easy-to-reach places. If you need to reach something above you, use a strong step stool that has a grab bar. Keep electrical cords out of the way. Do not use floor polish or wax that makes floors slippery. If you must use wax, use non-skid floor wax. Do not have throw rugs and other things on the floor that can make you trip. What can I do with my stairs? Do not leave any items on the stairs. Make sure that there are handrails on both sides of the stairs and use them. Fix handrails that are broken or loose. Make sure that handrails are as long as the stairways. Check any carpeting to make sure that it is firmly attached to the stairs. Fix any carpet that is loose or worn. Avoid having throw rugs at the top or bottom of the stairs. If you do have throw rugs, attach them to the floor  with carpet tape. Make sure that you have a light switch at the top of the stairs and the bottom of the stairs. If you do not have them, ask someone to add them for you. What else can I do to help prevent falls? Wear shoes that: Do not have high heels. Have rubber bottoms. Are comfortable and fit you well. Are closed at the toe. Do not wear sandals. If you use a stepladder: Make sure that it is fully opened. Do not climb a closed stepladder. Make sure that both sides of the stepladder are locked into place. Ask someone to hold it for you, if possible. Clearly mark and make sure that you can see: Any grab bars or handrails. First and last steps. Where the edge of each step is. Use tools that help you move around (mobility aids) if they are needed. These include: Canes. Walkers. Scooters. Crutches. Turn on the lights when you go into a dark area. Replace any light bulbs as soon as they burn out. Set up your furniture so you have a clear path. Avoid moving your furniture around. If any of your floors are uneven, fix them. If there are any pets around you, be aware of where they are. Review your medicines with your doctor. Some medicines can make you feel dizzy. This can increase your chance of falling. Ask your doctor what other things that you can do to help prevent falls. This information is not intended to replace advice given to you by your health care provider. Make sure you discuss any questions you have with your health care provider. Document Released: 10/22/2008 Document Revised: 06/03/2015 Document Reviewed: 01/30/2014 Elsevier Interactive Patient Education  2017 [employer].

## 2022-04-21 ENCOUNTER — Other Ambulatory Visit: Payer: Self-pay | Admitting: Internal Medicine

## 2022-04-21 DIAGNOSIS — E113593 Type 2 diabetes mellitus with proliferative diabetic retinopathy without macular edema, bilateral: Secondary | ICD-10-CM

## 2022-05-10 LAB — HM DIABETES EYE EXAM

## 2022-05-26 ENCOUNTER — Ambulatory Visit: Payer: PPO | Admitting: Internal Medicine

## 2022-05-26 ENCOUNTER — Encounter: Payer: Self-pay | Admitting: Internal Medicine

## 2022-05-26 VITALS — BP 124/82 | HR 100 | Ht 62.0 in | Wt 136.4 lb

## 2022-05-26 DIAGNOSIS — E1169 Type 2 diabetes mellitus with other specified complication: Secondary | ICD-10-CM | POA: Diagnosis not present

## 2022-05-26 DIAGNOSIS — Z7984 Long term (current) use of oral hypoglycemic drugs: Secondary | ICD-10-CM | POA: Diagnosis not present

## 2022-05-26 DIAGNOSIS — E785 Hyperlipidemia, unspecified: Secondary | ICD-10-CM

## 2022-05-26 DIAGNOSIS — E11319 Type 2 diabetes mellitus with unspecified diabetic retinopathy without macular edema: Secondary | ICD-10-CM | POA: Diagnosis not present

## 2022-05-26 DIAGNOSIS — E119 Type 2 diabetes mellitus without complications: Secondary | ICD-10-CM

## 2022-05-26 DIAGNOSIS — E113593 Type 2 diabetes mellitus with proliferative diabetic retinopathy without macular edema, bilateral: Secondary | ICD-10-CM | POA: Diagnosis not present

## 2022-05-26 DIAGNOSIS — E13319 Other specified diabetes mellitus with unspecified diabetic retinopathy without macular edema: Secondary | ICD-10-CM

## 2022-05-26 LAB — POCT GLYCOSYLATED HEMOGLOBIN (HGB A1C): Hemoglobin A1C: 8 % — AB (ref 4.0–5.6)

## 2022-05-26 MED ORDER — RYBELSUS 7 MG PO TABS: 7.0000 mg | ORAL_TABLET | Freq: Every day | ORAL | 3 refills | Status: DC

## 2022-05-26 MED ORDER — GLIMEPIRIDE 1 MG PO TABS: 1.0000 mg | ORAL_TABLET | Freq: Every day | ORAL | 3 refills | Status: DC

## 2022-05-26 MED ORDER — EZETIMIBE 10 MG PO TABS: 10.0000 mg | ORAL_TABLET | Freq: Every day | ORAL | 3 refills | Status: AC

## 2022-05-26 MED ORDER — METFORMIN HCL ER 500 MG PO TB24: ORAL_TABLET | ORAL | 3 refills | Status: DC

## 2022-05-26 NOTE — Progress Notes (Signed)
Patient ID: Lindsay Henderson, female   DOB: 28-Aug-1955, 67 y.o.   MRN: [id number]  HPI: Lindsay Henderson is a 67 y.o.-year-old female, returning for follow-up for LADA , dx 07/2021, prev. DM2, dx in 2000 (had gestational diabetes in 12 in 1992), non-insulin-dependent, uncontrolled, with complications (DR, macular edema). Pt. previously saw Dr. Everardo All, but last visit with me 4 months ago.  Interim history: No increased urination, blurry vision, nausea, chest pain. She exercises more on the treadmill and also watching her diet. She is also babysitting her 68 y/o granddaughter.  Reviewed HbA1c: Lab Results  Component Value Date   HGBA1C 8.6 (H) 03/01/2022   HGBA1C 7.9 (A) 11/08/2021   HGBA1C 8.7 (A) 07/28/2021   HGBA1C 8.4 (A) 05/12/2021   HGBA1C 8.5 (A) 03/03/2021   HGBA1C 8.7 (H) 02/11/2021   HGBA1C 8.3 (A) 11/26/2020   HGBA1C 8.0 (A) 08/26/2020   HGBA1C 7.3 (A) 03/30/2020   HGBA1C 7.3 (A) 12/24/2019   Pt is on a regimen of: - Metformin ER 1000 mg 2x a day - Amaryl 1-2 mg with breakfast (higher dose with steroids) - Rybelsus 3.5 - 7 mg daily - added 07/2021 (varies the dose 2/2 GI SEs: D and GERD) She previously had vaginitis with Jardiance, despite significant improvement in her blood sugars while on it. Invokana was not affordable. She was previously on repaglinide before changing to glimepiride. She tried bromocriptine and Rybelsus and they caused occasional gas pains. She had diarrhea and tremors with Actos. She refused insulin.  Pt checks her sugars 1-2x a day and they are: - am: 146-183 >> 143-185, 292 >> 140-150 >> 87-140 - 2h after b'fast: 151, 167 >> n/c - before lunch: n/c >> 140-150 - 2h after lunch: n/c >> 148 >> n/c >> 150-170 >> 175-180 - before dinner: 135 >> n/c >> 150-170 >> 150-170 - 2h after dinner: 259, 286 (cake) >> n/c >> 170-180 >> 160s - bedtime: n/c >> 141  >> 180-200 >> see above - nighttime: n/c Lowest sugar was 101 >> 110 >> 80s (started  Rybelsus); she has hypoglycemia awareness at 70.  Highest sugar was 286 > 300s (Prednisone) >> 325 >> 180  Glucometer: ReliOn  - no CKD, last BUN/creatinine:  Lab Results  Component Value Date   BUN 15 03/01/2022   BUN 12 02/11/2021   CREATININE 0.73 03/01/2022   CREATININE 0.69 02/11/2021  She is not on ACE inhibitor/ARB  -+ HL; last set of lipids: Lab Results  Component Value Date   CHOL 212 (H) 03/01/2022   HDL 59.20 03/01/2022   LDLCALC 135 (H) 03/01/2022   LDLDIRECT 152.7 08/26/2012   TRIG 91.0 03/01/2022   CHOLHDL 4 03/01/2022  She is on Livalo 2 mg occasionally due the muscle aches. Prev. On Crestor >> mm aches.  She restarted Zetia 10 mg daily 07/2021 - inconsistently.  - last eye exam was on 05/10/2022. Stable + PDR B, + macular edema. Dr. Allyne Gee.  - no numbness and tingling in her feet.  Last foot exam 03/01/2022.  Patient also has a history of GERD, renal stones, asthma, history of SVT, vitamin D deficiency, anxiety.  ROS: + see HPI  Past Medical History:  Diagnosis Date   Adenomatous colon polyp 11/2005   Allergic rhinitis    Allergy    Anomalous atrioventricular excitation    Anxiety    no per pt   Asthma    Diverticulosis of colon    Gastritis    GERD (gastroesophageal reflux  disease)    History of supraventricular tachycardia    Hypercholesteremia    Kidney stones    Nephrolithiasis    Renal cyst    Somatic dysfunction    Type II or unspecified type diabetes mellitus without mention of complication, not stated as uncontrolled    Vitamin D deficiency    Past Surgical History:  Procedure Laterality Date   CARDIAC ELECTROPHYSIOLOGY MAPPING AND ABLATION  2004   COLONOSCOPY  2007   TUBAL LIGATION     Social History   Socioeconomic History   Marital status: Married    Spouse name: Not on file   Number of children: 4   Years of education: Not on file   Highest education level: Not on file  Occupational History   Occupation: [employer]   Tobacco Use   Smoking status: Never   Smokeless tobacco: Never  Substance and Sexual Activity   Alcohol use: No   Drug use: No   Sexual activity: Not Currently  Other Topics Concern   Not on file  Social History Narrative   Not on file   Social Determinants of Health   Financial Resource Strain: Low Risk  (03/29/2022)   Overall Financial Resource Strain (CARDIA)    Difficulty of Paying Living Expenses: Not hard at all  Food Insecurity: No Food Insecurity (03/29/2022)   Hunger Vital Sign    Worried About Running Out of Food in the Last Year: Never true    Ran Out of Food in the Last Year: Never true  Transportation Needs: No Transportation Needs (03/29/2022)   PRAPARE - Administrator, Civil Service (Medical): No    Lack of Transportation (Non-Medical): No  Physical Activity: Sufficiently Active (03/29/2022)   Exercise Vital Sign    Days of Exercise per Week: 5 days    Minutes of Exercise per Session: 30 min  Stress: No Stress Concern Present (03/29/2022)   [employer] of Occupational Health - Occupational Stress Questionnaire    Feeling of Stress : Not at all  Social Connections: Socially Integrated (03/29/2022)   Social Connection and Isolation Panel [NHANES]    Frequency of Communication with Friends and Family: More than three times a week    Frequency of Social Gatherings with Friends and Family: More than three times a week    Attends Religious Services: More than 4 times per year    Active Member of [employer] or Organizations: Yes    Attends Engineer, structural: More than 4 times per year    Marital Status: Married  Catering manager Violence: Not At Risk (03/29/2022)   Humiliation, Afraid, Rape, and Kick questionnaire    Fear of Current or Ex-Partner: No    Emotionally Abused: No    Physically Abused: No    Sexually Abused: No   Current Outpatient Medications on File Prior to Visit  Medication Sig Dispense Refill   albuterol (VENTOLIN HFA) 108  (90 Base) MCG/ACT inhaler Inhale 2 puffs into the lungs every 6 (six) hours as needed for wheezing or shortness of breath. 8 g 3   clotrimazole-betamethasone (LOTRISONE) cream Apply 1 application topically 2 (two) times daily. 45 g 2   cyclobenzaprine (FLEXERIL) 10 MG tablet Take 1 tablet (10 mg total) by mouth 3 (three) times daily as needed for muscle spasms. 45 tablet 0   doxycycline (VIBRA-TABS) 100 MG tablet Take 1 tablet (100 mg total) by mouth 2 (two) times daily. 20 tablet 0   esomeprazole (NEXIUM) 40 MG  capsule Take 1 capsule (40 mg total) by mouth daily. 90 capsule 3   estradiol (ESTRACE VAGINAL) 0.1 MG/GM vaginal cream Use nightly as directed x 2 weeks; then use 2 x per week for maintenance 42.5 g 11   ezetimibe (ZETIA) 10 MG tablet Take 1 tablet (10 mg total) by mouth daily. 90 tablet 3   fexofenadine ([name]) 180 MG tablet Take 180 mg by mouth daily.     fluconazole (DIFLUCAN) 150 MG tablet Take 1 tablet (150 mg total) by mouth every 3 (three) days. 2 tablet 0   fluticasone (FLONASE) 50 MCG/ACT nasal spray Place 2 sprays into both nostrils daily.     glimepiride (AMARYL) 1 MG tablet Take 1 tablet (1 mg total) by mouth daily with breakfast. 90 tablet 3   metFORMIN (GLUCOPHAGE-XR) 500 MG 24 hr tablet TAKE 4 TABLETS BY MOUTH ONCE DAILY WITH BREAKFAST 360 tablet 0   nitrofurantoin, macrocrystal-monohydrate, (MACROBID) 100 MG capsule Take 1 capsule (100 mg total) by mouth 2 (two) times daily. 10 capsule 0   Pitavastatin Calcium 2 MG TABS Take 1 tablet (2 mg total) by mouth daily. 90 tablet 3   Semaglutide (RYBELSUS) 7 MG TABS Take 7 mg by mouth daily. 90 tablet 3   tobramycin (TOBREX) 0.3 % ophthalmic solution 1 drop 4 (four) times daily.     No current facility-administered medications on file prior to visit.   Allergies  Allergen Reactions   Parlodel [Bromocriptine Mesylate] Nausea Only    nausea   Peanut-Containing Drug Products Hives, Itching and Swelling   Penicillins      REACTION: rash and sob   Pioglitazone Diarrhea    tremor   Sulfa Antibiotics Hives   Zithromax [Azithromycin] Nausea And Vomiting   Family History  Problem Relation Age of Onset   Diabetes Father    Hypertension Father    Hyperlipidemia Father    Breast cancer Mother    Esophageal cancer Neg Hx    Stomach cancer Neg Hx    Colon cancer Neg Hx    Rectal cancer Neg Hx    PE: BP 124/82 (BP Location: Left Arm, Patient Position: Sitting, Cuff Size: Normal)   Pulse 100   Ht 5\' 2"  (1.575 m)   Wt 136 lb 6.4 oz (61.9 kg)   SpO2 97%   BMI 24.95 kg/m  Wt Readings from Last 3 Encounters:  05/26/22 136 lb 6.4 oz (61.9 kg)  03/29/22 139 lb (63 kg)  03/09/22 139 lb 9.6 oz (63.3 kg)   Constitutional: normal weight, in NAD Eyes: no exophthalmos ENT: no thyromegaly, no cervical lymphadenopathy Cardiovascular: RRR (no tachycardia at the time of the physical exam), No MRG Respiratory: CTA B Musculoskeletal: no deformities Skin: no rashes Neurological: no tremor with outstretched hands  ASSESSMENT: 1. LADA, non-insulin-dependent, uncontrolled, with complications -Proliferative diabetic retinopathy bilaterally -Macular edema  Component     Latest Ref Rng 07/28/2021  Hemoglobin A1C     4.0 - 5.6 % 8.7 !   Glucose     65 - 99 mg/dL 161 (H)   Islet Cell Ab     Neg:<1:1  Negative   ZNT8 Antibodies     <15 U/mL <10   C-Peptide     0.80 - 3.85 ng/mL 1.78   Glutamic Acid Decarb Ab     <5 IU/mL 10 (H)    She does not have low insulin production, but her GAD antibodies are positive.  This qualifies her for LADA.   2. HL  PLAN:  1. Patient with longstanding, uncontrolled, type 2 diabetes, on oral antidiabetic regimen with metformin, sulfonylurea, and p.o. GLP-1 receptor agonist.  She does have a diagnosis of LADA, but with a normal C-peptide and elevated GAD antibodies.  We did not have to add insulin yet due to good insulin production.  At last visit, she relaxed her diet over the  holidays and she was working on improving this.  Sugars were better after she was able to restart Rybelsus (but she has to take a PPI).  We did not change the regimen.  HbA1c before the visit was higher, at 8.6%. -At today's visit, sugars appear to be slightly improved in the morning, but they are still above target before lunch and dinner.  She is not taking Amaryl in the morning and the days that she is exercising for fear of dropping her blood sugars before lunch.  At today's visit I advised her to restart taking Amaryl consistently, 1 mg, but move it before lunch.  Also, she is varying the dose of Rybelsus, between 3.5 and 7 mg daily and I advised her to try to stay with the 7 mg if possible.  Will continue metformin at the current dose. - I suggested to:  Patient Instructions  Please continue: - Metformin ER 1000 mg 2x a day  Restart: - Amaryl 1 mg  before lunch (2 mg while on steroids)  Try to use: - Rybelsus 7 mg daily before b'fast (and before Nexium)  Please return in 3-4 months with your sugar log.   - we checked her HbA1c: 8% (lower) - advised to check sugars at different times of the day - 1x a day, rotating check times - advised for yearly eye exams >> she is UTD - return to clinic in 3-4 months  2. HL -Reviewed latest lipid panel from 02/2022: LDL above target, the rest of the fractions at goal: Lab Results  Component Value Date   CHOL 212 (H) 03/01/2022   HDL 59.20 03/01/2022   LDLCALC 135 (H) 03/01/2022   LDLDIRECT 152.7 08/26/2012   TRIG 91.0 03/01/2022   CHOLHDL 4 03/01/2022  -She continues Livalo 2 mg daily but not taking by daily due to muscle aches.  She is also on Zetia 10 mg daily started 07/2021.  At this visit, she tells me that she is not taking this consistently.  I advised her to restart it and sent a refill to her pharmacy.  Carlus Pavlov, MD PhD [facility] Endocrinology

## 2022-05-26 NOTE — Patient Instructions (Addendum)
Please continue: - Metformin ER 1000 mg 2x a day  Restart: - Amaryl 1 mg  before lunch (2 mg while on steroids)  Try to use: - Rybelsus 7 mg daily before b'fast (and before Nexium)  Please return in 3-4 months with your sugar log.

## 2022-06-08 ENCOUNTER — Telehealth: Payer: Self-pay

## 2022-06-08 NOTE — Telephone Encounter (Signed)
*  Endo  PA request received via CMM for Rybelsus 7MG  tablets  PA submitted to [facility] [employer] and is pending additional questions/determination  Key: [id number]

## 2022-06-09 NOTE — Telephone Encounter (Signed)
PA has been APPROVED from 06/08/2022-06/08/2023 

## 2022-08-18 ENCOUNTER — Encounter: Payer: Self-pay | Admitting: Internal Medicine

## 2022-08-18 ENCOUNTER — Ambulatory Visit (INDEPENDENT_AMBULATORY_CARE_PROVIDER_SITE_OTHER): Payer: PPO | Admitting: Internal Medicine

## 2022-08-18 DIAGNOSIS — N3000 Acute cystitis without hematuria: Secondary | ICD-10-CM

## 2022-08-18 MED ORDER — FLUCONAZOLE 150 MG PO TABS: 150.0000 mg | ORAL_TABLET | ORAL | 0 refills | Status: DC

## 2022-08-18 MED ORDER — NITROFURANTOIN MONOHYD MACRO 100 MG PO CAPS
100.0000 mg | ORAL_CAPSULE | Freq: Two times a day (BID) | ORAL | 0 refills | Status: DC
Start: 2022-08-18 — End: 2023-04-02

## 2022-08-18 NOTE — Progress Notes (Signed)
   Subjective:   Patient ID: Lindsay Henderson, female    DOB: 16-Nov-1955, 67 y.o.   MRN: [id number]  HPI The patient is a 67 YO female coming in for possible UTI. Burning since 2-3 days  Review of Systems  Constitutional: Negative.   Respiratory: Negative.    Cardiovascular: Negative.   Gastrointestinal:  Negative for abdominal distention, abdominal pain, constipation, diarrhea, nausea and vomiting.  Genitourinary:  Positive for dysuria, frequency and urgency.  Musculoskeletal: Negative.   Skin: Negative.     Objective:  Physical Exam Constitutional:      Appearance: She is well-developed.  HENT:     Head: Normocephalic and atraumatic.  Cardiovascular:     Rate and Rhythm: Normal rate and regular rhythm.  Pulmonary:     Effort: Pulmonary effort is normal. No respiratory distress.     Breath sounds: Normal breath sounds. No wheezing or rales.  Abdominal:     General: Bowel sounds are normal. There is no distension.     Palpations: Abdomen is soft.     Tenderness: There is no abdominal tenderness. There is no rebound.  Musculoskeletal:     Cervical back: Normal range of motion.  Skin:    General: Skin is warm and dry.  Neurological:     Mental Status: She is alert and oriented to person, place, and time.     Coordination: Coordination normal.     Vitals:   08/18/22 1446 08/18/22 1448  BP: (!) 140/80 (!) 140/80  Pulse: (!) 104   Temp: 98 F (36.7 C)   TempSrc: Oral   SpO2: 98%   Weight: 135 lb (61.2 kg)   Height: 5\' 2"  (1.575 m)     Assessment & Plan:

## 2022-08-18 NOTE — Assessment & Plan Note (Signed)
Rx macrobid 5 day course and diflucan. She has done much better since estrace cream and very few UTI lately. If not improving will get urine sample she was unable to urinate at visit today.

## 2022-10-04 ENCOUNTER — Encounter: Payer: Self-pay | Admitting: Internal Medicine

## 2022-10-04 ENCOUNTER — Ambulatory Visit: Payer: PPO | Admitting: Internal Medicine

## 2022-10-04 VITALS — BP 136/62 | HR 88 | Resp 16 | Ht 62.0 in | Wt 135.2 lb

## 2022-10-04 DIAGNOSIS — E1169 Type 2 diabetes mellitus with other specified complication: Secondary | ICD-10-CM

## 2022-10-04 DIAGNOSIS — Z7984 Long term (current) use of oral hypoglycemic drugs: Secondary | ICD-10-CM

## 2022-10-04 DIAGNOSIS — E785 Hyperlipidemia, unspecified: Secondary | ICD-10-CM

## 2022-10-04 DIAGNOSIS — E1369 Other specified diabetes mellitus with other specified complication: Secondary | ICD-10-CM | POA: Diagnosis not present

## 2022-10-04 DIAGNOSIS — E13319 Other specified diabetes mellitus with unspecified diabetic retinopathy without macular edema: Secondary | ICD-10-CM

## 2022-10-04 LAB — POCT GLYCOSYLATED HEMOGLOBIN (HGB A1C): Hemoglobin A1C: 8.2 % — AB (ref 4.0–5.6)

## 2022-10-04 NOTE — Patient Instructions (Addendum)
Please continue: - Metformin ER 1000 mg 2x a day - Amaryl 1 mg  before lunch (2 mg while on steroids)  Try to resume: - Rybelsus 7 mg daily before b'fast (and before Nexium)  If you do not tolerate Rybelsus, let me know so we can try Jardiance/Farxiga.  Please return in 3-4 months with your sugar log.

## 2022-10-04 NOTE — Addendum Note (Signed)
Addended by: Tera Partridge on: 10/04/2022 10:11 AM   Modules accepted: Orders

## 2022-10-04 NOTE — Progress Notes (Signed)
Patient ID: Lindsay Henderson, female   DOB: 1955/08/30, 67 y.o.   MRN: [id number]  HPI: Lindsay Henderson is a 67 y.o.-year-old female, returning for follow-up for LADA , dx 07/2021, prev. DM2, dx in 2000 (had gestational diabetes in 22 in 1992), non-insulin-dependent, uncontrolled, with complications (DR, macular edema). Pt. previously saw Dr. Everardo All, but last visit with me 4 months ago.  Interim history: No increased urination, blurry vision, nausea, chest pain. She is having a lot of dietary indiscretions: Chips, lurpees, hamburgers, fries She continues to babysit her 1 y/o granddaughter.  Reviewed HbA1c: Lab Results  Component Value Date   HGBA1C 8.0 (A) 05/26/2022   HGBA1C 8.6 (H) 03/01/2022   HGBA1C 7.9 (A) 11/08/2021   HGBA1C 8.7 (A) 07/28/2021   HGBA1C 8.4 (A) 05/12/2021   HGBA1C 8.5 (A) 03/03/2021   HGBA1C 8.7 (H) 02/11/2021   HGBA1C 8.3 (A) 11/26/2020   HGBA1C 8.0 (A) 08/26/2020   HGBA1C 7.3 (A) 03/30/2020   Pt is on a regimen of: - Metformin ER 1000 mg 2x a day - Amaryl 1-2 mg with breakfast >> before lunch (higher dose with steroids) - Rybelsus 3.5 - 7 mg daily - added 07/2021 (was varying the dose 2/2 GI SEs: D and GERD) >> 7 >> 3.5 mg >> now off for 1 month She previously had vaginitis with Jardiance, despite significant improvement in her blood sugars while on it. Invokana was not affordable. She was previously on repaglinide before changing to glimepiride. She tried bromocriptine and Rybelsus and they caused occasional gas pains. She had diarrhea and tremors with Actos. She refused insulin.  Pt checks her sugars 1-2x a day and they are: - am: 146-183 >> 143-185, 292 >> 140-150 >> 87-140 >> 98, 131-166, 176 - 2h after b'fast: 151, 167 >> n/c >> 117-163 - before lunch: n/c >> 140-150 >> 93 - 2h after lunch: n/c >> 148 >> n/c >> 150-170 >> 175-180 >> 121, 130 - before dinner: 135 >> n/c >> 150-170 >> 150-170 >> 108 - 2h after dinner: 259, 286 (cake) >> n/c >>  170-180 >> 160s >> 150-175 - bedtime: n/c >> 141  >> 180-200 >> 195 - nighttime: n/c Lowest sugar was 80s (started Rybelsus) >> 98; she has hypoglycemia awareness at 70.  Highest sugar was 325 >> 180 >> 200s.  Glucometer: ReliOn  - no CKD, last BUN/creatinine:  Lab Results  Component Value Date   BUN 15 03/01/2022   BUN 12 02/11/2021   CREATININE 0.73 03/01/2022   CREATININE 0.69 02/11/2021   Lab Results  Component Value Date   MICRALBCREAT 2.4 03/01/2022   MICRALBCREAT 1.7 02/11/2021   MICRALBCREAT 1.9 02/10/2020   MICRALBCREAT 1.3 10/23/2018   MICRALBCREAT 1.3 12/17/2017   MICRALBCREAT 1.2 11/01/2016   MICRALBCREAT 1.5 04/15/2015   MICRALBCREAT 10.3 03/13/2013   MICRALBCREAT 1.0 02/15/2012   MICRALBCREAT 5.0 06/17/2008  She is not on ACE inhibitor/ARB  -+ HL; last set of lipids: Lab Results  Component Value Date   CHOL 212 (H) 03/01/2022   HDL 59.20 03/01/2022   LDLCALC 135 (H) 03/01/2022   LDLDIRECT 152.7 08/26/2012   TRIG 91.0 03/01/2022   CHOLHDL 4 03/01/2022  She is on Livalo 2 mg occasionally due the muscle aches. Prev. On Crestor >> mm aches.  She restarted Zetia 10 mg daily 07/2021 - inconsistently.  - last eye exam was on 05/10/2022. Stable + PDR B, + macular edema. Dr. Allyne Gee.  - no numbness and tingling in her feet.  Last foot exam 03/01/2022.  Patient also has a history of GERD, renal stones, asthma, history of SVT, vitamin D deficiency, anxiety.  ROS: + see HPI  Past Medical History:  Diagnosis Date   Adenomatous colon polyp 11/2005   Allergic rhinitis    Allergy    Anomalous atrioventricular excitation    Anxiety    no per pt   Asthma    Diverticulosis of colon    Gastritis    GERD (gastroesophageal reflux disease)    History of supraventricular tachycardia    Hypercholesteremia    Kidney stones    Nephrolithiasis    Renal cyst    Somatic dysfunction    Type II or unspecified type diabetes mellitus without mention of complication, not  stated as uncontrolled    Vitamin D deficiency    Past Surgical History:  Procedure Laterality Date   CARDIAC ELECTROPHYSIOLOGY MAPPING AND ABLATION  2004   COLONOSCOPY  2007   TUBAL LIGATION     Social History   Socioeconomic History   Marital status: Married    Spouse name: Not on file   Number of children: 4   Years of education: Not on file   Highest education level: Not on file  Occupational History   Occupation: [employer]  Tobacco Use   Smoking status: Never   Smokeless tobacco: Never  Substance and Sexual Activity   Alcohol use: No   Drug use: No   Sexual activity: Not Currently  Other Topics Concern   Not on file  Social History Narrative   Not on file   Social Determinants of Health   Financial Resource Strain: Low Risk  (03/29/2022)   Overall Financial Resource Strain (CARDIA)    Difficulty of Paying Living Expenses: Not hard at all  Food Insecurity: No Food Insecurity (03/29/2022)   Hunger Vital Sign    Worried About Running Out of Food in the Last Year: Never true    Ran Out of Food in the Last Year: Never true  Transportation Needs: No Transportation Needs (03/29/2022)   PRAPARE - Administrator, Civil Service (Medical): No    Lack of Transportation (Non-Medical): No  Physical Activity: Sufficiently Active (03/29/2022)   Exercise Vital Sign    Days of Exercise per Week: 5 days    Minutes of Exercise per Session: 30 min  Stress: No Stress Concern Present (03/29/2022)   [employer] of Occupational Health - Occupational Stress Questionnaire    Feeling of Stress : Not at all  Social Connections: Socially Integrated (03/29/2022)   Social Connection and Isolation Panel [NHANES]    Frequency of Communication with Friends and Family: More than three times a week    Frequency of Social Gatherings with Friends and Family: More than three times a week    Attends Religious Services: More than 4 times per year    Active Member of [employer] or  Organizations: Yes    Attends Engineer, structural: More than 4 times per year    Marital Status: Married  Catering manager Violence: Not At Risk (03/29/2022)   Humiliation, Afraid, Rape, and Kick questionnaire    Fear of Current or Ex-Partner: No    Emotionally Abused: No    Physically Abused: No    Sexually Abused: No   Current Outpatient Medications on File Prior to Visit  Medication Sig Dispense Refill   albuterol (VENTOLIN HFA) 108 (90 Base) MCG/ACT inhaler Inhale 2 puffs into the lungs every 6 (six)  hours as needed for wheezing or shortness of breath. 8 g 3   clotrimazole-betamethasone (LOTRISONE) cream Apply 1 application topically 2 (two) times daily. 45 g 2   cyclobenzaprine (FLEXERIL) 10 MG tablet Take 1 tablet (10 mg total) by mouth 3 (three) times daily as needed for muscle spasms. 45 tablet 0   doxycycline (VIBRA-TABS) 100 MG tablet Take 1 tablet (100 mg total) by mouth 2 (two) times daily. 20 tablet 0   esomeprazole (NEXIUM) 40 MG capsule Take 1 capsule (40 mg total) by mouth daily. 90 capsule 3   estradiol (ESTRACE VAGINAL) 0.1 MG/GM vaginal cream Use nightly as directed x 2 weeks; then use 2 x per week for maintenance 42.5 g 11   ezetimibe (ZETIA) 10 MG tablet Take 1 tablet (10 mg total) by mouth daily. 90 tablet 3   fexofenadine ([name]) 180 MG tablet Take 180 mg by mouth daily.     fluconazole (DIFLUCAN) 150 MG tablet Take 1 tablet (150 mg total) by mouth every 3 (three) days. 2 tablet 0   fluticasone (FLONASE) 50 MCG/ACT nasal spray Place 2 sprays into both nostrils daily.     glimepiride (AMARYL) 1 MG tablet Take 1 tablet (1 mg total) by mouth daily with breakfast. 90 tablet 3   metFORMIN (GLUCOPHAGE-XR) 500 MG 24 hr tablet TAKE 4 TABLETS BY MOUTH ONCE DAILY WITH BREAKFAST 360 tablet 3   nitrofurantoin, macrocrystal-monohydrate, (MACROBID) 100 MG capsule Take 1 capsule (100 mg total) by mouth 2 (two) times daily. 10 capsule 0   Pitavastatin Calcium 2 MG TABS Take  1 tablet (2 mg total) by mouth daily. 90 tablet 3   Semaglutide (RYBELSUS) 7 MG TABS Take 1 tablet (7 mg total) by mouth daily. 90 tablet 3   tobramycin (TOBREX) 0.3 % ophthalmic solution 1 drop 4 (four) times daily.     No current facility-administered medications on file prior to visit.   Allergies  Allergen Reactions   Parlodel [Bromocriptine Mesylate] Nausea Only    nausea   Peanut-Containing Drug Products Hives, Itching and Swelling   Penicillins     REACTION: rash and sob   Pioglitazone Diarrhea    tremor   Sulfa Antibiotics Hives   Zithromax [Azithromycin] Nausea And Vomiting   Family History  Problem Relation Age of Onset   Diabetes Father    Hypertension Father    Hyperlipidemia Father    Breast cancer Mother    Esophageal cancer Neg Hx    Stomach cancer Neg Hx    Colon cancer Neg Hx    Rectal cancer Neg Hx    PE: BP 136/62 (BP Location: Left Arm, Patient Position: Sitting, Cuff Size: Normal)   Pulse 88   Resp 16   Ht 5\' 2"  (1.575 m)   Wt 135 lb 3.2 oz (61.3 kg)   SpO2 98%   BMI 24.73 kg/m  Wt Readings from Last 10 Encounters:  10/04/22 135 lb 3.2 oz (61.3 kg)  08/18/22 135 lb (61.2 kg)  05/26/22 136 lb 6.4 oz (61.9 kg)  03/29/22 139 lb (63 kg)  03/09/22 139 lb 9.6 oz (63.3 kg)  03/01/22 141 lb (64 kg)  11/08/21 139 lb 6.4 oz (63.2 kg)  11/03/21 140 lb (63.5 kg)  10/12/21 140 lb (63.5 kg)  08/12/21 141 lb (64 kg)   Constitutional: normal weight, in NAD Eyes: no exophthalmos ENT: no thyromegaly, no cervical lymphadenopathy Cardiovascular: RRR, No MRG Respiratory: CTA B Musculoskeletal: no deformities Skin: no rashes Neurological: no tremor with outstretched  hands  ASSESSMENT: 1. LADA, non-insulin-dependent, uncontrolled, with complications -Proliferative diabetic retinopathy bilaterally -Macular edema  Component     Latest Ref Rng 07/28/2021  Hemoglobin A1C     4.0 - 5.6 % 8.7 !   Glucose     65 - 99 mg/dL 161 (H)   Islet Cell Ab      Neg:<1:1  Negative   ZNT8 Antibodies     <15 U/mL <10   C-Peptide     0.80 - 3.85 ng/mL 1.78   Glutamic Acid Decarb Ab     <5 IU/mL 10 (H)    She does not have low insulin production, but her GAD antibodies are positive.  This qualifies her for LADA.   2. HL  PLAN:  1. 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  We did not have to add insulin yet.  At last visit, sugars were slightly improved in the morning but they were still above target before lunch and dinner.  She was not taking Amaryl in the morning in the days that she was exercising for fear of dropping her blood sugars before lunch.  We discussed about restarting to take Amaryl consistently, 1 mg daily, but to move it before lunch.  She was also during the dose of Rybelsus, between 3.5 and 7 mg daily and I advised her to stay with the 7 mg if possible.  A PA for this was approved since then.  HbA1c at last visit was lower, but still above target, at 8.0%. -At today's visit, sugars appear to be higher than target in the morning and fluctuating later in the day, with some blood sugars at goal, but also frequent values above target.  Upon questioning, she is off Rybelsus.  She felt that the 7 mg dose was causing some diarrhea and she then started to cut it in half but she has been off the medication in 1 month.  We discussed about options for treatment.  I did advise her to continue metformin and Amaryl but also discussed about possibly adding back Rybelsus and see if she now can tolerate it better versus an SGLT2 inhibitor.  She would like to try Rybelsus again since she has this at home but I advised her to let me know if sugars do not improve, or if she cannot tolerate Rybelsus, in which case, we will try [place] or Jardiance.  She did have yeast infections with Jardiance in the past so my preference would be for  [place]. - I suggested to:  Patient Instructions  Please continue: - Metformin ER 1000 mg 2x a day - Amaryl 1 mg  before lunch (2 mg while on steroids)  Try to resume: - Rybelsus 7 mg daily before b'fast (and before Nexium)  If you do not tolerate Rybelsus, let me know so we can try Jardiance/Farxiga.  Please return in 3-4 months with your sugar log.   - we checked her HbA1c: 8.2% (higher) - advised to check sugars at different times of the day - 1x a day, rotating check times - advised for yearly eye exams >> she is UTD and has an appointment coming up - return to clinic in 3-4 months  2. HL -Reviewed latest lipid panel from 02/2022: LDL above target, otherwise fractions at goal: Lab Results  Component Value Date   CHOL 212 (H) 03/01/2022   HDL 59.20 03/01/2022   LDLCALC 135 (H)  03/01/2022   LDLDIRECT 152.7 08/26/2012   TRIG 91.0 03/01/2022   CHOLHDL 4 03/01/2022  -Previously on Livalo 2 mg daily but at last visit she was not taking it due to muscle aches.  Zetia 10 mg was started in 07/2021 but at last visit she was not taking it consistently.  I strongly advised her to take it daily and refilled her prescription at last visit.  Carlus Pavlov, MD PhD [facility]

## 2022-11-01 ENCOUNTER — Ambulatory Visit (INDEPENDENT_AMBULATORY_CARE_PROVIDER_SITE_OTHER)
Admission: RE | Admit: 2022-11-01 | Discharge: 2022-11-01 | Disposition: A | Discharge status: Home / Self Care | Payer: PPO | Source: Ambulatory Visit | Attending: Internal Medicine | Admitting: Internal Medicine

## 2022-11-01 DIAGNOSIS — Z13820 Encounter for screening for osteoporosis: Secondary | ICD-10-CM | POA: Diagnosis not present

## 2022-11-06 ENCOUNTER — Encounter: Payer: Self-pay | Admitting: Internal Medicine

## 2022-11-06 LAB — HM DEXA SCAN: HM Dexa Scan: -1.1

## 2022-11-22 DIAGNOSIS — H2513 Age-related nuclear cataract, bilateral: Secondary | ICD-10-CM | POA: Diagnosis not present

## 2022-11-22 DIAGNOSIS — E113512 Type 2 diabetes mellitus with proliferative diabetic retinopathy with macular edema, left eye: Secondary | ICD-10-CM | POA: Diagnosis not present

## 2022-11-22 DIAGNOSIS — H04123 Dry eye syndrome of bilateral lacrimal glands: Secondary | ICD-10-CM | POA: Diagnosis not present

## 2022-11-22 DIAGNOSIS — H35372 Puckering of macula, left eye: Secondary | ICD-10-CM | POA: Diagnosis not present

## 2022-11-22 DIAGNOSIS — E113591 Type 2 diabetes mellitus with proliferative diabetic retinopathy without macular edema, right eye: Secondary | ICD-10-CM | POA: Diagnosis not present

## 2022-11-22 DIAGNOSIS — H31093 Other chorioretinal scars, bilateral: Secondary | ICD-10-CM | POA: Diagnosis not present

## 2022-12-13 DIAGNOSIS — E113512 Type 2 diabetes mellitus with proliferative diabetic retinopathy with macular edema, left eye: Secondary | ICD-10-CM | POA: Diagnosis not present

## 2022-12-26 ENCOUNTER — Encounter: Payer: Self-pay | Admitting: Internal Medicine

## 2022-12-26 ENCOUNTER — Ambulatory Visit (INDEPENDENT_AMBULATORY_CARE_PROVIDER_SITE_OTHER): Payer: PPO | Admitting: Internal Medicine

## 2022-12-26 ENCOUNTER — Other Ambulatory Visit: Payer: Self-pay | Admitting: Internal Medicine

## 2022-12-26 DIAGNOSIS — R058 Other specified cough: Secondary | ICD-10-CM

## 2022-12-26 DIAGNOSIS — J4521 Mild intermittent asthma with (acute) exacerbation: Secondary | ICD-10-CM | POA: Diagnosis not present

## 2022-12-26 DIAGNOSIS — J0140 Acute pansinusitis, unspecified: Secondary | ICD-10-CM

## 2022-12-26 MED ORDER — ALBUTEROL SULFATE HFA 108 (90 BASE) MCG/ACT IN AERS
2.0000 | INHALATION_SPRAY | Freq: Four times a day (QID) | RESPIRATORY_TRACT | 3 refills | Status: DC | PRN
Start: 2022-12-26 — End: 2023-04-04

## 2022-12-26 MED ORDER — FLUCONAZOLE 150 MG PO TABS: 150.0000 mg | ORAL_TABLET | ORAL | 0 refills | Status: DC

## 2022-12-26 MED ORDER — DOXYCYCLINE HYCLATE 100 MG PO TABS
100.0000 mg | ORAL_TABLET | Freq: Two times a day (BID) | ORAL | 0 refills | Status: DC
Start: 2022-12-26 — End: 2023-01-18

## 2022-12-26 NOTE — Patient Instructions (Signed)
We have sent in doxycycline to take 1 pill twice a day for 1 week. We have also sent in diflucan to use if needed for yeast infection.   We have refilled the albuterol inhaler as well.

## 2022-12-26 NOTE — Assessment & Plan Note (Signed)
Rx doxycycline 1 week supply and diflucan in case of yeast infection. Advised to continue allergy medication.

## 2022-12-26 NOTE — Assessment & Plan Note (Signed)
No clear flare today but refilled albuterol as hers is out and she has been using with this illness.

## 2022-12-26 NOTE — Progress Notes (Signed)
   Subjective:   Patient ID: Lindsay Henderson, female    DOB: 1955/10/22, 67 y.o.   MRN: [id number]  HPI The patient is a 67 YO female coming in for sinus symptoms 2-3 weeks. Some coughing and green sputum. No wheezing.   Review of Systems  Constitutional:  Positive for activity change, appetite change and chills. Negative for fatigue, fever and unexpected weight change.  HENT:  Positive for congestion, postnasal drip, rhinorrhea and sinus pressure. Negative for ear discharge, ear pain, sinus pain, sneezing, sore throat, tinnitus, trouble swallowing and voice change.   Eyes: Negative.   Respiratory:  Positive for cough. Negative for chest tightness, shortness of breath and wheezing.   Cardiovascular: Negative.   Gastrointestinal: Negative.   Musculoskeletal:  Positive for myalgias.  Neurological: Negative.     Objective:  Physical Exam Constitutional:      Appearance: She is well-developed.  HENT:     Head: Normocephalic and atraumatic.     Comments: Oropharynx with redness and clear drainage, nose with swollen turbinates, TMs normal bilaterally.  Neck:     Thyroid: No thyromegaly.  Cardiovascular:     Rate and Rhythm: Normal rate and regular rhythm.  Pulmonary:     Effort: Pulmonary effort is normal. No respiratory distress.     Breath sounds: Normal breath sounds. No wheezing or rales.  Abdominal:     Palpations: Abdomen is soft.  Musculoskeletal:        General: Tenderness present.     Cervical back: Normal range of motion.  Lymphadenopathy:     Cervical: No cervical adenopathy.  Skin:    General: Skin is warm and dry.  Neurological:     Mental Status: She is alert and oriented to person, place, and time.     Vitals:   12/26/22 1552  BP: (!) 140/80  Pulse: (!) 114  Temp: 98.9 F (37.2 C)  TempSrc: Oral  SpO2: 98%  Weight: 138 lb (62.6 kg)  Height: 5\' 2"  (1.575 m)    Assessment & Plan:

## 2022-12-29 ENCOUNTER — Other Ambulatory Visit: Payer: Self-pay | Admitting: Internal Medicine

## 2022-12-29 ENCOUNTER — Telehealth: Payer: Self-pay

## 2022-12-29 MED ORDER — PREDNISONE 20 MG PO TABS: 40.0000 mg | ORAL_TABLET | Freq: Every day | ORAL | 0 refills | Status: DC

## 2022-12-29 NOTE — Addendum Note (Signed)
Addended by: Corwin Levins on: 12/29/2022 04:11 PM   Modules accepted: Orders

## 2022-12-29 NOTE — Telephone Encounter (Signed)
Please advise for patient as MD is out of office

## 2022-12-29 NOTE — Telephone Encounter (Signed)
Ok this is one for prednisone   thanks

## 2022-12-29 NOTE — Telephone Encounter (Signed)
Copied from CRM [id number]. Topic: Clinical - Medication Question >> Dec 29, 2022  9:25 AM Fonda Kinder J wrote:  Reason for CRM: Pt was prescribed doxycycline (VIBRA-TABS) on 12/17 for a sinus infection and she states she's normally prescribed prednisone as well when she has this problem but she was not prescribed any and wants to know if the doctor is able to prescribe her some as she is still not feeling better

## 2023-01-11 ENCOUNTER — Telehealth: Payer: Self-pay

## 2023-01-11 ENCOUNTER — Other Ambulatory Visit: Payer: Self-pay | Admitting: Internal Medicine

## 2023-01-11 DIAGNOSIS — R058 Other specified cough: Secondary | ICD-10-CM

## 2023-01-11 DIAGNOSIS — J0140 Acute pansinusitis, unspecified: Secondary | ICD-10-CM

## 2023-01-11 NOTE — Telephone Encounter (Signed)
 Copied from CRM [id number]. Topic: Clinical - Medication Refill >> Jan 11, 2023  8:19 AM Isabell A wrote: Most Recent Primary Care Visit:  Provider: ROLLENE NORRIS A  Department: [facility]  Visit Type: SAME DAY  Date: 12/26/2022  Medication: doxycycline  (VIBRA -TABS) 100 MG tablet  Has the patient contacted their pharmacy? No (Agent: If no, request that the patient contact the pharmacy for the refill. If patient does not wish to contact the pharmacy document the reason why and proceed with request.) (Agent: If yes, when and what did the pharmacy advise?) Patient states the pharmacy won't refill antibiotic.   Is this the correct pharmacy for this prescription? Yes If no, delete pharmacy and type the correct one.  This is the patient's preferred pharmacy:  [facility] [address], [place] - [address] [place] [address] Phone: [phone] Fax: [phone]   Has the prescription been filled recently? Yes  Is the patient out of the medication? Yes  Has the patient been seen for an appointment in the last year OR does the patient have an upcoming appointment? Yes  Can we respond through MyChart? Yes  Agent: Please be advised that Rx refills may take up to 3 business days. We ask that you follow-up with your pharmacy.     Patient has lost her voice & has congestion, requesting refills of this antibiotic.

## 2023-01-11 NOTE — Telephone Encounter (Signed)
 Copied from CRM [id number]. Topic: Clinical - Medication Refill >> Jan 11, 2023  8:19 AM Isabell A wrote: Most Recent Primary Care Visit:  Provider: ROLLENE NORRIS A  Department: [facility]  Visit Type: SAME DAY  Date: 12/26/2022  Medication: ***  Has the patient contacted their pharmacy?  (Agent: If no, request that the patient contact the pharmacy for the refill. If patient does not wish to contact the pharmacy document the reason why and proceed with request.) (Agent: If yes, when and what did the pharmacy advise?)  Is this the correct pharmacy for this prescription?  If no, delete pharmacy and type the correct one.  This is the patient's preferred pharmacy:  [facility] [address] [place] - [address] [place] [address] Phone: [phone] Fax: [phone]   Has the prescription been filled recently?   Is the patient out of the medication?   Has the patient been seen for an appointment in the last year OR does the patient have an upcoming appointment?   Can we respond through MyChart?   Agent: Please be advised that Rx refills may take up to 3 business days. We ask that you follow-up with your pharmacy.

## 2023-01-13 ENCOUNTER — Other Ambulatory Visit: Payer: Self-pay | Admitting: Internal Medicine

## 2023-01-13 DIAGNOSIS — J0140 Acute pansinusitis, unspecified: Secondary | ICD-10-CM

## 2023-01-13 DIAGNOSIS — R058 Other specified cough: Secondary | ICD-10-CM

## 2023-01-18 ENCOUNTER — Ambulatory Visit: Payer: PPO | Admitting: Internal Medicine

## 2023-01-18 ENCOUNTER — Ambulatory Visit: Payer: Self-pay | Admitting: Internal Medicine

## 2023-01-18 ENCOUNTER — Encounter: Payer: Self-pay | Admitting: Internal Medicine

## 2023-01-18 VITALS — BP 130/70 | HR 113 | Temp 98.9°F | Ht 62.0 in | Wt 138.0 lb

## 2023-01-18 DIAGNOSIS — J22 Unspecified acute lower respiratory infection: Secondary | ICD-10-CM | POA: Diagnosis not present

## 2023-01-18 DIAGNOSIS — B9689 Other specified bacterial agents as the cause of diseases classified elsewhere: Secondary | ICD-10-CM

## 2023-01-18 LAB — CBC WITH DIFFERENTIAL/PLATELET
Absolute Lymphocytes: 2600 {cells}/uL (ref 850–3900)
Absolute Monocytes: 640 {cells}/uL (ref 200–950)
Basophils Absolute: 60 {cells}/uL (ref 0–200)
Basophils Relative: 0.6 %
Eosinophils Absolute: 350 {cells}/uL (ref 15–500)
Eosinophils Relative: 3.5 %
HCT: 39.4 % (ref 35.0–45.0)
Hemoglobin: 12.9 g/dL (ref 11.7–15.5)
MCH: 29.1 pg (ref 27.0–33.0)
MCHC: 32.7 g/dL (ref 32.0–36.0)
MCV: 88.9 fL (ref 80.0–100.0)
MPV: 12.7 fL — ABNORMAL HIGH (ref 7.5–12.5)
Monocytes Relative: 6.4 %
Neutro Abs: 6350 {cells}/uL (ref 1500–7800)
Neutrophils Relative %: 63.5 %
Platelets: 394 10*3/uL (ref 140–400)
RBC: 4.43 10*6/uL (ref 3.80–5.10)
RDW: 13.1 % (ref 11.0–15.0)
Total Lymphocyte: 26 %
WBC: 10 10*3/uL (ref 3.8–10.8)

## 2023-01-18 MED ORDER — BUDESONIDE-FORMOTEROL FUMARATE 80-4.5 MCG/ACT IN AERO
2.0000 | INHALATION_SPRAY | Freq: Two times a day (BID) | RESPIRATORY_TRACT | 3 refills | Status: AC
Start: 2023-01-18 — End: ?

## 2023-01-18 MED ORDER — HYDROCOD POLI-CHLORPHE POLI ER 10-8 MG/5ML PO SUER: 5.0000 mL | Freq: Two times a day (BID) | ORAL | 0 refills | Status: DC | PRN

## 2023-01-18 MED ORDER — CEFTRIAXONE SODIUM 1 G IJ SOLR
1.0000 g | Freq: Once | INTRAMUSCULAR | Status: AC
  Administered 2023-01-18: 1 g via INTRAMUSCULAR

## 2023-01-18 MED ORDER — LEVOFLOXACIN 500 MG PO TABS: 500.0000 mg | ORAL_TABLET | Freq: Every day | ORAL | 0 refills | Status: AC

## 2023-01-18 MED ORDER — FLUCONAZOLE 150 MG PO TABS: 150.0000 mg | ORAL_TABLET | Freq: Once | ORAL | 0 refills | Status: AC

## 2023-01-18 NOTE — Telephone Encounter (Signed)
 Copied from CRM [id number]. Topic: Appointments - Appointment Scheduling >> Jan 18, 2023  7:38 AM Victoria A wrote: Patient is still sick from Sinus Infection/wheezing, asthma flared up   Chief Complaint: sinus infection Symptoms: chest congestion, productive cough, green/yellow sputum, sinus pain Frequency: ongoing, did not complete antibiotics  Pertinent Negatives: Patient denies fever Disposition: [] ED /[] Urgent Care (no appt availability in office) / [] Appointment(In office/virtual)/ []  [facility]/ [] Home Care/ [] Refused Recommended Disposition /[] [facility]/ []  Follow-up with PCP Additional Notes: The patient reported that she was diagnosed with a sinus infection and prescribed doxycycline  100 mg BID.  She dropped some of the pills and only took 3.5 or 4 days of antibiotics.  She is still having sinus pain around her eyes that she rated a 6-7/10, chest congestion, a productive cough with green and yellow mucus.  She has a history of asthma and she used her albuterol  inhaler that caused her to cough more and become hoarse.  She requested a video visit to discuss her symptoms and to refill her antibiotic since she was unable to finish the full course.  No availability at her normal office so she was scheduled at a different pcp's office for further evaluation. Reason for Disposition  [1] Taking antibiotic > 72 hours (3 days) AND [2] sinus pain not improved  Answer Assessment - Initial Assessment Questions 1. ANTIBIOTIC: What antibiotic are you taking? How many times a day?     Doxycycline  100 mg BID 2. ONSET: When was the antibiotic started?     Wasted some of medicine and didn't take it all; 3.5-4 days  3. PAIN: How bad is the sinus pain?   (Scale 1-10; mild, moderate or severe)   - MILD (1-3): doesn't interfere with normal activities    - MODERATE (4-7): interferes with normal activities (e.g., work or school) or awakens from sleep   - SEVERE (8-10):  excruciating pain and patient unable to do any normal activities        Headache - near eyes 6-7/10 4. FEVER: Do you have a fever? If Yes, ask: What is it, how was it measured, and when did it start?      no 5. SYMPTOMS: Are there any other symptoms you're concerned about? If Yes, ask: When did it start?     Coughing - green and yellow, congestion in chest, hoarse, stopped inhaler due to causing her to cough  Protocols used: Sinus Infection on Antibiotic Follow-up Call-A-[facility]

## 2023-01-18 NOTE — Progress Notes (Addendum)
 Patient Care Team: Rollene Almarie LABOR, MD as PCP - General (Internal Medicine) Rutherford Gain, MD as Consulting Physician (Obstetrics and Gynecology) Darcie Fallow, [place] as Referring Physician (Optometry) Jarold Mayo, MD as Consulting Physician (Ophthalmology)  Visit Date: 01/18/23  Subjective:   Chief Complaint  Patient presents with   Cough    Coughing up green, cough has not gotten better since December. Did not take prednisone  bc it was making her see dark spots.    Sinusitis   Patient PI:Lindsay Henderson,Female DOB:May 03, 1955,67 y.o. FMW:[id number]  Not a regular patient seen by this practice, her PCP is Almarie Rollene, MD.   68 y.o. Female presents today for acute sick visit with Sinusitis. Patient has a history of Allergies, Allergic Rhinitis, Asthma, and DM Type 2. Saw her PCP December 19th, was diagnosed with a sinus infection and prescribed 100 mg Doxycycline  and Prednisone . She notes that she tends to have similar symptoms around this same time annually. Does have hx of asthma, used Albuterol  inhaler 01/09/23 and couldn't stop coughing. States she was hoarse afterwards for a week. Reports that she took 3 days of Doxycycline  before losing the remainder and the Prednisone  was helping initially until she started experiencing vision changes, so she stopped. Was beginning to feel better initially before symptoms began again with cough and production of mucus that was initially clear and then became greenish-yellow. Using lemon water, Delsym, and had started Mucinex for her symptoms.   Past Medical History:  Diagnosis Date   Adenomatous colon polyp 11/2005   Allergic rhinitis    Allergy    Anomalous atrioventricular excitation    Anxiety    no per pt   Asthma    Diverticulosis of colon    Gastritis    GERD (gastroesophageal reflux disease)    History of supraventricular tachycardia    Hypercholesteremia    Kidney stones    Nephrolithiasis    Renal cyst     Somatic dysfunction    Type II or unspecified type diabetes mellitus without mention of complication, not stated as uncontrolled    Vitamin D  deficiency     Family History  Problem Relation Age of Onset   Diabetes Father    Hypertension Father    Hyperlipidemia Father    Breast cancer Mother    Esophageal cancer Neg Hx    Stomach cancer Neg Hx    Colon cancer Neg Hx    Rectal cancer Neg Hx    Social history:  Review of Systems  Constitutional:  Negative for fever and malaise/fatigue.  HENT:  Negative for congestion.   Eyes:  Negative for blurred vision.  Respiratory:  Positive for cough and sputum production. Negative for shortness of breath.   Cardiovascular:  Negative for chest pain, palpitations and leg swelling.  Gastrointestinal:  Negative for vomiting.  Musculoskeletal:  Negative for back pain.  Skin:  Negative for rash.  Neurological:  Negative for loss of consciousness and headaches.     Objective:  Vitals: BP 130/70   Pulse (!) 113   Temp 98.9 F (37.2 C)   Ht 5' 2 (1.575 m)   Wt 138 lb (62.6 kg)   SpO2 98%   BMI 25.24 kg/m   Physical Exam Vitals and nursing note reviewed.  Constitutional:      General: She is not in acute distress.    Appearance: Normal appearance. She is not toxic-appearing.  HENT:     Head: Normocephalic and atraumatic.     Right Ear:  There is impacted cerumen (some, blocking visual).     Ears:     Comments: Left Ear: Splayed Light Reflex    Mouth/Throat:     Pharynx: Oropharynx is clear.  Cardiovascular:     Pulses: Normal pulses.  Pulmonary:     Effort: Pulmonary effort is normal.     Breath sounds: Wheezing (bilaterally) and rales (bilaterally) present.  Lymphadenopathy:     Cervical: Cervical adenopathy (bilateral, small & mobile) present.  Skin:    General: Skin is warm and dry.  Neurological:     Mental Status: She is alert and oriented to person, place, and time. Mental status is at baseline.  Psychiatric:        Mood  and Affect: Mood normal.        Behavior: Behavior normal.        Thought Content: Thought content normal.        Judgment: Judgment normal.     Results:  Studies Obtained And Personally Reviewed By Me: Labs:     Component Value Date/Time   NA 143 03/01/2022 0910   K 4.3 03/01/2022 0910   CL 103 03/01/2022 0910   CO2 24 03/01/2022 0910   GLUCOSE 140 (H) 03/01/2022 0910   GLUCOSE 134 12/10/2014 0820   BUN 15 03/01/2022 0910   CREATININE 0.73 03/01/2022 0910   CALCIUM  10.1 03/01/2022 0910   PROT 7.4 03/01/2022 0910   ALBUMIN 4.4 03/01/2022 0910   AST 15 03/01/2022 0910   ALT 17 03/01/2022 0910   ALKPHOS 69 03/01/2022 0910   BILITOT 0.4 03/01/2022 0910   GFRNONAA >90 12/29/2011 2132   GFRAA >90 12/29/2011 2132    Lab Results  Component Value Date   WBC 8.9 03/01/2022   HGB 13.6 03/01/2022   HCT 40.8 03/01/2022   MCV 88.7 03/01/2022   PLT 398.0 03/01/2022   Lab Results  Component Value Date   CHOL 212 (H) 03/01/2022   HDL 59.20 03/01/2022   LDLCALC 135 (H) 03/01/2022   LDLDIRECT 152.7 08/26/2012   TRIG 91.0 03/01/2022   CHOLHDL 4 03/01/2022   Lab Results  Component Value Date   HGBA1C 8.2 (A) 10/04/2022    Lab Results  Component Value Date   TSH 1.94 12/19/2018   Assessment & Plan:   Persistent Lower Respiratory Tract Infection: Blood drawn for CBC. Received 1 g IM Rocephin  and 80 mg Depo-Medrol IM today. Concerned for PNA due to crackling in lower lungs bilaterally - ordering Chest CXR. Sending in 500 mg Levaquin  - take once a day with a meal for 7 days, Tussionex for cough - take 1 teaspoon every 12 hours as needed for cough, and 150 mg Diflucan  tab one dose by mouth in case of yeast infection.   I,Lindsay Henderson,acting as a neurosurgeon for Lindsay JINNY Hailstone, MD.,have documented all relevant documentation on the behalf of Lindsay JINNY Hailstone, MD,as directed by  Lindsay JINNY Hailstone, MD while in the presence of Lindsay JINNY Hailstone, MD.  I, Lindsay JINNY Hailstone, MD, have reviewed all documentation  for this visit. The documentation on 02/03/23 for the exam, diagnosis, procedures, and orders are all accurate and complete.

## 2023-01-18 NOTE — Progress Notes (Deleted)
 Patient Care Team: Rollene Almarie LABOR, MD as PCP - General (Internal Medicine) Rutherford Gain, MD as Consulting Physician (Obstetrics and Gynecology) Darcie Fallow, [place] as Referring Physician (Optometry) Jarold Mayo, MD as Consulting Physician (Ophthalmology)  Visit Date: 01/18/23  Subjective:   Chief Complaint  Patient presents with   Cough    Coughing up green, cough has not gotten better since December. Did not take prednisone  bc it was making her see dark spots.    Sinusitis   Patient PI:Lindsay Henderson,Female DOB:10/17/1955,67 y.o. FMW:[id number]  Not a regular patient seen by this practice, her PCP is Almarie Rollene, MD.   68 y.o. Female presents today for acute sick visit with Sinusitis. Patient has a history of Allergies, Allergic Rhinitis, Asthma, and DM Type 2. Saw her PCP December 19th, was diagnosed with a sinus infection and prescribed 100 mg Doxycycline  and Prednisone . She notes that she tends to have similar symptoms around this same time annually. Does have hx of asthma, used Albuterol  inhaler 01/09/23 and couldn't stop coughing. States she was hoarse afterwards for a week. Reports that she took 3 days of Doxycycline  before losing the remainder and the Prednisone  was helping initially until she started experiencing vision changes, so she stopped. Was beginning to feel better initially before symptoms began again with cough and production of mucus that was initially clear and then became greenish-yellow. Using lemon water, Delsym, and had started Mucinex for her symptoms.   Past Medical History:  Diagnosis Date   Adenomatous colon polyp 11/2005   Allergic rhinitis    Allergy    Anomalous atrioventricular excitation    Anxiety    no per pt   Asthma    Diverticulosis of colon    Gastritis    GERD (gastroesophageal reflux disease)    History of supraventricular tachycardia    Hypercholesteremia    Kidney stones    Nephrolithiasis    Renal cyst     Somatic dysfunction    Type II or unspecified type diabetes mellitus without mention of complication, not stated as uncontrolled    Vitamin D  deficiency     Family History  Problem Relation Age of Onset   Diabetes Father    Hypertension Father    Hyperlipidemia Father    Breast cancer Mother    Esophageal cancer Neg Hx    Stomach cancer Neg Hx    Colon cancer Neg Hx    Rectal cancer Neg Hx    Social History   Social History Narrative   Not on file   Review of Systems  Constitutional:  Negative for fever and malaise/fatigue.  HENT:  Negative for congestion.   Eyes:  Negative for blurred vision.  Respiratory:  Positive for cough and sputum production. Negative for shortness of breath.   Cardiovascular:  Negative for chest pain, palpitations and leg swelling.  Gastrointestinal:  Negative for vomiting.  Musculoskeletal:  Negative for back pain.  Skin:  Negative for rash.  Neurological:  Negative for loss of consciousness and headaches.     Objective:  Vitals: BP 130/70   Pulse (!) 113   Temp 98.9 F (37.2 C)   Ht 5' 2 (1.575 m)   Wt 138 lb (62.6 kg)   SpO2 98%   BMI 25.24 kg/m   Physical Exam Vitals and nursing note reviewed.  Constitutional:      General: She is not in acute distress.    Appearance: Normal appearance. She is not toxic-appearing.  HENT:  Head: Normocephalic and atraumatic.     Right Ear: There is impacted cerumen (some, blocking visual).     Ears:     Comments: Left Ear: Splayed Light Reflex    Mouth/Throat:     Pharynx: Oropharynx is clear.  Cardiovascular:     Pulses: Normal pulses.  Pulmonary:     Effort: Pulmonary effort is normal.     Breath sounds: Wheezing (bilaterally) and rales (bilaterally) present.  Lymphadenopathy:     Cervical: Cervical adenopathy (bilateral, small & mobile) present.  Skin:    General: Skin is warm and dry.  Neurological:     Mental Status: She is alert and oriented to person, place, and time. Mental status  is at baseline.  Psychiatric:        Mood and Affect: Mood normal.        Behavior: Behavior normal.        Thought Content: Thought content normal.        Judgment: Judgment normal.     Results:  Studies Obtained And Personally Reviewed By Me: Labs:     Component Value Date/Time   NA 143 03/01/2022 0910   K 4.3 03/01/2022 0910   CL 103 03/01/2022 0910   CO2 24 03/01/2022 0910   GLUCOSE 140 (H) 03/01/2022 0910   GLUCOSE 134 12/10/2014 0820   BUN 15 03/01/2022 0910   CREATININE 0.73 03/01/2022 0910   CALCIUM  10.1 03/01/2022 0910   PROT 7.4 03/01/2022 0910   ALBUMIN 4.4 03/01/2022 0910   AST 15 03/01/2022 0910   ALT 17 03/01/2022 0910   ALKPHOS 69 03/01/2022 0910   BILITOT 0.4 03/01/2022 0910   GFRNONAA >90 12/29/2011 2132   GFRAA >90 12/29/2011 2132    Lab Results  Component Value Date   WBC 8.9 03/01/2022   HGB 13.6 03/01/2022   HCT 40.8 03/01/2022   MCV 88.7 03/01/2022   PLT 398.0 03/01/2022   Lab Results  Component Value Date   CHOL 212 (H) 03/01/2022   HDL 59.20 03/01/2022   LDLCALC 135 (H) 03/01/2022   LDLDIRECT 152.7 08/26/2012   TRIG 91.0 03/01/2022   CHOLHDL 4 03/01/2022   Lab Results  Component Value Date   HGBA1C 8.2 (A) 10/04/2022    Lab Results  Component Value Date   TSH 1.94 12/19/2018   Assessment & Plan:   Persistent lower respiratory tract infection:   Blood drawn for CBC. Received 1 g IM Rocephin  and 80 mg Depo-Medrol IM today. Concerned for PNA due to crackling in lower lungs bilaterally - ordering Chest CXR. Sending in 500 mg Levaquin  - take twice a day with a meal for 10 days, Tussionex for cough - take 1 teaspoon every 12 hours as needed for cough, and 150 mg Diflucan  tab one dose by mouth in case of yeast infection.   I,Emily Lagle,acting as a neurosurgeon for Ronal JINNY Hailstone, MD.,have documented all relevant documentation on the behalf of Ronal JINNY Hailstone, MD,as directed by  Ronal JINNY Hailstone, MD while in the presence of Ronal JINNY Hailstone, MD.   I,  Ronal JINNY Hailstone, MD, have reviewed all documentation for this visit. The documentation on 01/18/23 for the exam, diagnosis, procedures, and orders are all accurate and complete.

## 2023-01-19 ENCOUNTER — Ambulatory Visit
Admission: RE | Admit: 2023-01-19 | Discharge: 2023-01-19 | Disposition: A | Discharge status: Home / Self Care | Payer: PPO | Source: Ambulatory Visit | Attending: Internal Medicine | Admitting: Internal Medicine

## 2023-01-19 DIAGNOSIS — R0989 Other specified symptoms and signs involving the circulatory and respiratory systems: Secondary | ICD-10-CM | POA: Diagnosis not present

## 2023-01-19 DIAGNOSIS — R059 Cough, unspecified: Secondary | ICD-10-CM | POA: Diagnosis not present

## 2023-01-24 ENCOUNTER — Ambulatory Visit: Payer: PPO | Admitting: Internal Medicine

## 2023-01-24 ENCOUNTER — Encounter: Payer: Self-pay | Admitting: Internal Medicine

## 2023-01-24 VITALS — BP 134/80 | HR 115 | Temp 98.9°F | Wt 139.0 lb

## 2023-01-24 DIAGNOSIS — J4520 Mild intermittent asthma, uncomplicated: Secondary | ICD-10-CM

## 2023-01-24 NOTE — Progress Notes (Signed)
   Subjective:   Patient ID: Lindsay Henderson, female    DOB: 06-22-1955, 68 y.o.   MRN: [id number]  HPI The patient is a 68 YO female coming in for cough and congestion. Seen and treated for 3 weeks of symptoms 12/18 with doxycycline  which improved. Then seen about 1 week ago with CXR and given ceftriaxone  IM and started on 1 week levofloxacin . She was started on symbicort  as well. She is feeling better now but still some cough.   Review of Systems  Constitutional: Negative.   HENT:  Positive for congestion, postnasal drip and rhinorrhea.   Eyes: Negative.   Respiratory:  Positive for cough. Negative for chest tightness and shortness of breath.   Cardiovascular:  Negative for chest pain, palpitations and leg swelling.  Gastrointestinal:  Negative for abdominal distention, abdominal pain, constipation, diarrhea, nausea and vomiting.  Musculoskeletal: Negative.   Skin: Negative.   Neurological: Negative.   Psychiatric/Behavioral: Negative.      Objective:  Physical Exam Constitutional:      Appearance: She is well-developed.  HENT:     Head: Normocephalic and atraumatic.  Cardiovascular:     Rate and Rhythm: Normal rate and regular rhythm.  Pulmonary:     Effort: Pulmonary effort is normal. No respiratory distress.     Breath sounds: Normal breath sounds. No wheezing or rales.     Comments: Some congestion right lower lung Abdominal:     General: Bowel sounds are normal. There is no distension.     Palpations: Abdomen is soft.     Tenderness: There is no abdominal tenderness. There is no rebound.  Musculoskeletal:     Cervical back: Normal range of motion.  Skin:    General: Skin is warm and dry.  Neurological:     Mental Status: She is alert and oriented to person, place, and time.     Coordination: Coordination normal.     Vitals:   01/24/23 0922  BP: 134/80  Pulse: (!) 115  Temp: 98.9 F (37.2 C)  TempSrc: Oral  SpO2: 97%  Weight: 139 lb (63 kg)     Assessment & Plan:  Visit time 15 minutes in face to face communication with patient and coordination of care, additional 5 minutes spent in record review, coordination or care, ordering tests, communicating/referring to other healthcare professionals, documenting in medical records all on the same day of the visit for total time 20 minutes spent on the visit.

## 2023-01-24 NOTE — Assessment & Plan Note (Signed)
 Has 1 day left of levaquin  and will finish. No wheezing on exam today. Still some coughing which is normal and decreasing in severity/frequency. Advised to continue symbicort  for 1-2 weeks. Continue cough medicine 1/4 teaspoon in the evening as needed.

## 2023-02-02 DIAGNOSIS — Z1231 Encounter for screening mammogram for malignant neoplasm of breast: Secondary | ICD-10-CM | POA: Diagnosis not present

## 2023-02-02 LAB — HM MAMMOGRAPHY

## 2023-02-03 NOTE — Patient Instructions (Addendum)
Patient was treated with Rocephin 1 g IM and 80 mg IM Depo-Medrol.  Chest x-ray ordered.  No pneumonia detected.  Patient will also take Levaquin 500 milligrams daily for 7 days.  Was also treated with Tussionex for cough 1 teaspoon every 12 hours as needed.  Rest and stay well-hydrated.  May take Diflucan 150 mg tablet in case of yeast infection caused by antibiotics.  Call if not improving in 24 to 48 hours or sooner if worse.

## 2023-02-05 ENCOUNTER — Encounter: Payer: Self-pay | Admitting: Internal Medicine

## 2023-02-05 ENCOUNTER — Ambulatory Visit: Payer: PPO | Admitting: Internal Medicine

## 2023-02-07 ENCOUNTER — Ambulatory Visit: Payer: PPO | Admitting: Internal Medicine

## 2023-02-07 ENCOUNTER — Encounter: Payer: Self-pay | Admitting: Internal Medicine

## 2023-02-07 VITALS — BP 130/60 | HR 110 | Ht 62.0 in | Wt 138.4 lb

## 2023-02-07 DIAGNOSIS — E10319 Type 1 diabetes mellitus with unspecified diabetic retinopathy without macular edema: Secondary | ICD-10-CM | POA: Diagnosis not present

## 2023-02-07 DIAGNOSIS — E1069 Type 1 diabetes mellitus with other specified complication: Secondary | ICD-10-CM

## 2023-02-07 DIAGNOSIS — H04123 Dry eye syndrome of bilateral lacrimal glands: Secondary | ICD-10-CM | POA: Diagnosis not present

## 2023-02-07 DIAGNOSIS — E113512 Type 2 diabetes mellitus with proliferative diabetic retinopathy with macular edema, left eye: Secondary | ICD-10-CM | POA: Diagnosis not present

## 2023-02-07 DIAGNOSIS — H31093 Other chorioretinal scars, bilateral: Secondary | ICD-10-CM | POA: Diagnosis not present

## 2023-02-07 DIAGNOSIS — E13319 Other specified diabetes mellitus with unspecified diabetic retinopathy without macular edema: Secondary | ICD-10-CM

## 2023-02-07 DIAGNOSIS — E785 Hyperlipidemia, unspecified: Secondary | ICD-10-CM

## 2023-02-07 DIAGNOSIS — E113591 Type 2 diabetes mellitus with proliferative diabetic retinopathy without macular edema, right eye: Secondary | ICD-10-CM | POA: Diagnosis not present

## 2023-02-07 DIAGNOSIS — H35372 Puckering of macula, left eye: Secondary | ICD-10-CM | POA: Diagnosis not present

## 2023-02-07 DIAGNOSIS — H2513 Age-related nuclear cataract, bilateral: Secondary | ICD-10-CM | POA: Diagnosis not present

## 2023-02-07 LAB — POCT GLYCOSYLATED HEMOGLOBIN (HGB A1C): Hemoglobin A1C: 9 % — AB (ref 4.0–5.6)

## 2023-02-07 MED ORDER — DAPAGLIFLOZIN PROPANEDIOL 5 MG PO TABS: 5.0000 mg | ORAL_TABLET | Freq: Every day | ORAL | 3 refills | Status: DC

## 2023-02-07 NOTE — Patient Instructions (Addendum)
Please continue: - Metformin ER 1000 mg 2x a day - Amaryl 1 mg  before lunch (2 mg while on steroids)  Please add: - Farxiga 5 mg before b'fast  Please return in 3 months with your sugar log.

## 2023-02-07 NOTE — Progress Notes (Signed)
Patient ID: Lindsay Henderson, female   DOB: 1955/01/13, 68 y.o.   MRN: [id number]  HPI: Lindsay Henderson is a 68 y.o.-year-old female, returning for follow-up for LADA , dx 07/2021, prev. DM2, dx in 2000 (had gestational diabetes in 89 in 1992), non-insulin-dependent, uncontrolled, with complications (DR, macular edema). Pt. previously saw Dr. Everardo All, but last visit with me 4 months ago.  Interim history: No increased urination, nausea, chest pain. She comes from her retina specialist - having intraocular injections. She had a sinus infection >> steroids, cough medicine, ABx. Sugars were very high, up to 400s.  He is now feeling better, but still having a cough.  Reviewed HbA1c: Lab Results  Component Value Date   HGBA1C 8.2 (A) 10/04/2022   HGBA1C 8.0 (A) 05/26/2022   HGBA1C 8.6 (H) 03/01/2022   HGBA1C 7.9 (A) 11/08/2021   HGBA1C 8.7 (A) 07/28/2021   HGBA1C 8.4 (A) 05/12/2021   HGBA1C 8.5 (A) 03/03/2021   HGBA1C 8.7 (H) 02/11/2021   HGBA1C 8.3 (A) 11/26/2020   HGBA1C 8.0 (A) 08/26/2020   Pt is on a regimen of: - Metformin ER 1000 mg 2x a day - Amaryl 1-2 mg with breakfast >> before lunch (higher dose with steroids) -  >> heartburn She previously had vaginitis with Jardiance, despite significant improvement in her blood sugars while on it. Invokana was not affordable. She was previously on repaglinide before changing to glimepiride. She tried bromocriptine and Rybelsus and they caused occasional gas pains. She had diarrhea and tremors with Actos. She refused insulin.  Pt checks her sugars 1-2x a day and they are: - am: 140-150 >> 87-140 >> 98, 131-166, 176 >> 147-173, 227 - 2h after b'fast: 151, 167 >> n/c >> 117-163 >> n/c - before lunch: n/c >> 140-150 >> 93 >> 175-180  - 2h after lunch: 150-170 >> 175-180 >> 121, 130 >> n/c - before dinner: 150-170 >> 150-170 >> 108 >> 165 - 2h after dinner: 170-180 >> 160s >> 150-175 >> 175-180 - bedtime: n/c >> 141  >> 180-200 >>  195 >> 120-168, 186 - nighttime: n/c Lowest sugar was 80s (started Rybelsus) >> 98 >> 79; she has hypoglycemia awareness at 70.  Highest sugar was 325 >> 180 >> 200s >> 400s (steroids).  Glucometer: ReliOn  - no CKD, last BUN/creatinine:  Lab Results  Component Value Date   BUN 15 03/01/2022   BUN 12 02/11/2021   CREATININE 0.73 03/01/2022   CREATININE 0.69 02/11/2021   Lab Results  Component Value Date   MICRALBCREAT 2.4 03/01/2022   MICRALBCREAT 1.7 02/11/2021   MICRALBCREAT 1.9 02/10/2020   MICRALBCREAT 1.3 10/23/2018   MICRALBCREAT 1.3 12/17/2017   MICRALBCREAT 1.2 11/01/2016   MICRALBCREAT 1.5 04/15/2015   MICRALBCREAT 10.3 03/13/2013   MICRALBCREAT 1.0 02/15/2012   MICRALBCREAT 5.0 06/17/2008  She is not on ACE inhibitor/ARB  -+ HL; last set of lipids: Lab Results  Component Value Date   CHOL 212 (H) 03/01/2022   HDL 59.20 03/01/2022   LDLCALC 135 (H) 03/01/2022   LDLDIRECT 152.7 08/26/2012   TRIG 91.0 03/01/2022   CHOLHDL 4 03/01/2022  She is on Livalo 2 mg occasionally due the muscle aches. Prev. On Crestor >> mm aches.  She restarted Zetia 10 mg daily 07/2021 - inconsistently.  - last eye exam was on 05/10/2022. Improving + PDR B, + macular edema. Dr. Allyne Gee - IO injections.  - no numbness and tingling in her feet.  Last foot exam 03/01/2022.  Patient also  has a history of GERD, renal stones, asthma, history of SVT, vitamin D deficiency, anxiety.  ROS: + see HPI  Past Medical History:  Diagnosis Date   Adenomatous colon polyp 11/2005   Allergic rhinitis    Allergy    Anomalous atrioventricular excitation    Anxiety    no per pt   Asthma    Diverticulosis of colon    Gastritis    GERD (gastroesophageal reflux disease)    History of supraventricular tachycardia    Hypercholesteremia    Kidney stones    Nephrolithiasis    Renal cyst    Somatic dysfunction    Type II or unspecified type diabetes mellitus without mention of complication, not stated  as uncontrolled    Vitamin D deficiency    Past Surgical History:  Procedure Laterality Date   CARDIAC ELECTROPHYSIOLOGY MAPPING AND ABLATION  2004   COLONOSCOPY  2007   TUBAL LIGATION     Social History   Socioeconomic History   Marital status: Married    Spouse name: Not on file   Number of children: 4   Years of education: Not on file   Highest education level: Not on file  Occupational History   Occupation: [employer]  Tobacco Use   Smoking status: Never   Smokeless tobacco: Never  Substance and Sexual Activity   Alcohol use: No   Drug use: No   Sexual activity: Not Currently  Other Topics Concern   Not on file  Social History Narrative   Not on file   Social Drivers of Health   Financial Resource Strain: Low Risk  (03/29/2022)   Overall Financial Resource Strain (CARDIA)    Difficulty of Paying Living Expenses: Not hard at all  Food Insecurity: No Food Insecurity (03/29/2022)   Hunger Vital Sign    Worried About Running Out of Food in the Last Year: Never true    Ran Out of Food in the Last Year: Never true  Transportation Needs: No Transportation Needs (03/29/2022)   PRAPARE - Administrator, Civil Service (Medical): No    Lack of Transportation (Non-Medical): No  Physical Activity: Sufficiently Active (03/29/2022)   Exercise Vital Sign    Days of Exercise per Week: 5 days    Minutes of Exercise per Session: 30 min  Stress: No Stress Concern Present (03/29/2022)   [employer] of Occupational Health - Occupational Stress Questionnaire    Feeling of Stress : Not at all  Social Connections: Socially Integrated (03/29/2022)   Social Connection and Isolation Panel [NHANES]    Frequency of Communication with Friends and Family: More than three times a week    Frequency of Social Gatherings with Friends and Family: More than three times a week    Attends Religious Services: More than 4 times per year    Active Member of [employer] or Organizations:  Yes    Attends Engineer, structural: More than 4 times per year    Marital Status: Married  Catering manager Violence: Not At Risk (03/29/2022)   Humiliation, Afraid, Rape, and Kick questionnaire    Fear of Current or Ex-Partner: No    Emotionally Abused: No    Physically Abused: No    Sexually Abused: No   Current Outpatient Medications on File Prior to Visit  Medication Sig Dispense Refill   albuterol (VENTOLIN HFA) 108 (90 Base) MCG/ACT inhaler Inhale 2 puffs into the lungs every 6 (six) hours as needed for wheezing or shortness  of breath. 8 g 3   budesonide-formoterol (SYMBICORT) 80-4.5 MCG/ACT inhaler Inhale 2 puffs into the lungs 2 (two) times daily. 1 each 3   chlorpheniramine-HYDROcodone (TUSSIONEX) 10-8 MG/5ML Take 5 mLs by mouth every 12 (twelve) hours as needed for cough. 120 mL 0   clotrimazole-betamethasone (LOTRISONE) cream Apply 1 application topically 2 (two) times daily. 45 g 2   cyclobenzaprine (FLEXERIL) 10 MG tablet Take 1 tablet (10 mg total) by mouth 3 (three) times daily as needed for muscle spasms. 45 tablet 0   esomeprazole (NEXIUM) 40 MG capsule Take 1 capsule (40 mg total) by mouth daily. 90 capsule 3   estradiol (ESTRACE VAGINAL) 0.1 MG/GM vaginal cream Use nightly as directed x 2 weeks; then use 2 x per week for maintenance 42.5 g 11   ezetimibe (ZETIA) 10 MG tablet Take 1 tablet (10 mg total) by mouth daily. 90 tablet 3   fexofenadine ([name]) 180 MG tablet Take 180 mg by mouth daily.     fluticasone (FLONASE) 50 MCG/ACT nasal spray Place 2 sprays into both nostrils daily.     glimepiride (AMARYL) 1 MG tablet Take 1 tablet (1 mg total) by mouth daily with breakfast. 90 tablet 3   metFORMIN (GLUCOPHAGE-XR) 500 MG 24 hr tablet TAKE 4 TABLETS BY MOUTH ONCE DAILY WITH BREAKFAST 360 tablet 3   nitrofurantoin, macrocrystal-monohydrate, (MACROBID) 100 MG capsule Take 1 capsule (100 mg total) by mouth 2 (two) times daily. 10 capsule 0   Pitavastatin Calcium 2  MG TABS Take 1 tablet (2 mg total) by mouth daily. 90 tablet 3   Semaglutide (RYBELSUS) 7 MG TABS Take 1 tablet (7 mg total) by mouth daily. 90 tablet 3   tobramycin (TOBREX) 0.3 % ophthalmic solution 1 drop 4 (four) times daily.     No current facility-administered medications on file prior to visit.   Allergies  Allergen Reactions   Parlodel [Bromocriptine Mesylate] Nausea Only    nausea   Peanut-Containing Drug Products Hives, Itching and Swelling   Penicillins     REACTION: rash and sob   Pioglitazone Diarrhea    tremor   Sulfa Antibiotics Hives   Zithromax [Azithromycin] Nausea And Vomiting   Family History  Problem Relation Age of Onset   Diabetes Father    Hypertension Father    Hyperlipidemia Father    Breast cancer Mother    Esophageal cancer Neg Hx    Stomach cancer Neg Hx    Colon cancer Neg Hx    Rectal cancer Neg Hx    PE: BP 130/60   Pulse (!) 110   Ht 5\' 2"  (1.575 m)   Wt 138 lb 6.4 oz (62.8 kg)   SpO2 94%   BMI 25.31 kg/m  Wt Readings from Last 10 Encounters:  02/07/23 138 lb 6.4 oz (62.8 kg)  01/24/23 139 lb (63 kg)  01/18/23 138 lb (62.6 kg)  12/26/22 138 lb (62.6 kg)  10/04/22 135 lb 3.2 oz (61.3 kg)  08/18/22 135 lb (61.2 kg)  05/26/22 136 lb 6.4 oz (61.9 kg)  03/29/22 139 lb (63 kg)  03/09/22 139 lb 9.6 oz (63.3 kg)  03/01/22 141 lb (64 kg)   Constitutional: normal weight, in NAD Eyes: no exophthalmos ENT: no thyromegaly, no cervical lymphadenopathy Cardiovascular: Tachycardia, RR, No MRG Respiratory: CTA B Musculoskeletal: no deformities Skin: no rashes Neurological: no tremor with outstretched hands Diabetic Foot Exam - Simple   Simple Foot Form Diabetic Foot exam was performed with the following findings: Yes  02/07/2023 10:51 AM  Visual Inspection No deformities, no ulcerations, no other skin breakdown bilaterally: Yes Sensation Testing Intact to touch and monofilament testing bilaterally: Yes Pulse Check Posterior Tibialis and  Dorsalis pulse intact bilaterally: Yes Comments    ASSESSMENT: 1. 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   Glucose     65 - 99 mg/dL 629 (H)   Islet Cell Ab     Neg:<1:1  Negative   ZNT8 Antibodies     <15 U/mL <10   C-Peptide     0.80 - 3.85 ng/mL 1.78   Glutamic Acid Decarb Ab     <5 IU/mL 10 (H)    She does not have low insulin production, but her GAD antibodies are positive.  This qualifies her for LADA.   2. HL  PLAN:  1. Patient with longstanding, uncontrolled, LADA, on oral antidiabetic regimen with metformin, sulfonylurea, and daily GLP-1 receptor agonist.  We did not have to add insulin yet.  Her HbA1c at last visit was higher (8.2%), but she was off Rybelsus at that time due to diarrhea.  Sugars appears to be higher than target in the morning and fluctuating later in the day with frequent values above target.  We discussed about starting back on a low lower dose of Rybelsus but if not tolerated, to let me know, so we could start an SGLT2 inhibitor.  She did have yeast infections with Jardiance in the past and we discussed that Marcelline Deist would be an option. -At today's visit, she tells me that she tried Rybelsus but could not tolerate it due to increased GERD.  She did not let me know so I can prescribe an SGLT2 inhibitor but based on today's sugars, which are higher, we can try this now.  She agreed to try either [place] or Jardiance at a low dose.  I sent a prescription for Marcelline Deist to her pharmacy.  For now we will continue metformin and Amaryl.  The sugars have increased significantly during her upper respiratory illness and especially steroid treatment but I am expecting that they will improve in the next month.  I advised her to let me know if they do not, in which case,  we may need insulin.  I also strongly advised her to improve diet. - I suggested to:  Patient Instructions  Please continue: - Metformin ER 1000 mg 2x a day - Amaryl 1 mg  before lunch (2 mg while on steroids)  Please add: - Farxiga 5 mg before b'fast  Please return in 3 months with your sugar log.   - we checked her HbA1c: 9% (higher) - advised to check sugars at different times of the day - 1-2x a day, rotating check times - advised for yearly eye exams >> she is UTD - return to clinic in 3-4 months  2. HL -Reviewed latest lipid panel from approximately a year ago, LDL above target, otherwise fractions at goal: Lab Results  Component Value Date   CHOL 212 (H) 03/01/2022   HDL 59.20 03/01/2022   LDLCALC 135 (H) 03/01/2022   LDLDIRECT 152.7 08/26/2012   TRIG 91.0 03/01/2022   CHOLHDL 4 03/01/2022  -She was previously on Livalo 2 mg daily but she stopped due to muscle aches.  Zetia 10 mg was started in the setting  07/2021 but she was not taking it consistently.  I advised her to start and refilled her prescription. -She is due for annual lipid panel -has an annual physical exam with PCP coming up next month  Carlus Pavlov, MD PhD [facility] Endocrinology

## 2023-02-08 ENCOUNTER — Telehealth: Payer: Self-pay

## 2023-02-08 NOTE — Progress Notes (Signed)
Care Guide Pharmacy Note  02/08/2023 Name: Lindsay Henderson MRN: [id number] DOB: [birth year]  Referred By: Myrlene Broker, MD Reason for referral: Care Coordination (TNM Diabetes. )   Lindsay Henderson is a 68 y.o. year old female who is a primary care patient of Myrlene Broker, MD.  Lindsay Henderson was referred to the pharmacist for assistance related to: DMII  An unsuccessful telephone outreach was attempted today to contact the patient who was referred to the pharmacy team for assistance with diabetes. Additional attempts will be made to contact the patient.  [facility]  [facility], [facility] Health Care Management Assistant Direct Dial: [phone]  Fax: [phone]

## 2023-02-08 NOTE — Telephone Encounter (Signed)
Pt is requesting a call back from [employer] regarding her diabetic medication.    [phone]

## 2023-02-08 NOTE — Progress Notes (Signed)
Care Guide Pharmacy Note  02/08/2023 Name: Lindsay Henderson MRN: [id number] DOB: August 25, 1955  Referred By: Myrlene Broker, MD Reason for referral: Care Coordination (TNM Diabetes. )   Lindsay Henderson is a 68 y.o. year old female who is a primary care patient of Myrlene Broker, MD.  [name] was referred to the pharmacist for assistance related to: DMII  Successful contact was made with the patient to discuss pharmacy services.  Patient declines engagement at this time. Contact information was provided to the patient should they wish to reach out for assistance at a later time.  [facility]  [facility], [facility] Health Care Management Assistant Direct Dial: [phone]  Fax: [phone]

## 2023-03-09 ENCOUNTER — Other Ambulatory Visit: Payer: Self-pay | Admitting: Internal Medicine

## 2023-03-22 ENCOUNTER — Telehealth: Payer: Self-pay

## 2023-03-22 NOTE — Telephone Encounter (Signed)
 Patient was identified as falling into the [place] Measure - Diabetes.   Patient was: Patient refuses intervention. Patient follows with Endocrinology.

## 2023-03-28 DIAGNOSIS — E113591 Type 2 diabetes mellitus with proliferative diabetic retinopathy without macular edema, right eye: Secondary | ICD-10-CM | POA: Diagnosis not present

## 2023-03-28 DIAGNOSIS — E113512 Type 2 diabetes mellitus with proliferative diabetic retinopathy with macular edema, left eye: Secondary | ICD-10-CM | POA: Diagnosis not present

## 2023-03-28 DIAGNOSIS — H2513 Age-related nuclear cataract, bilateral: Secondary | ICD-10-CM | POA: Diagnosis not present

## 2023-03-28 DIAGNOSIS — H35372 Puckering of macula, left eye: Secondary | ICD-10-CM | POA: Diagnosis not present

## 2023-03-28 DIAGNOSIS — H31093 Other chorioretinal scars, bilateral: Secondary | ICD-10-CM | POA: Diagnosis not present

## 2023-03-28 DIAGNOSIS — H04123 Dry eye syndrome of bilateral lacrimal glands: Secondary | ICD-10-CM | POA: Diagnosis not present

## 2023-04-02 ENCOUNTER — Ambulatory Visit (INDEPENDENT_AMBULATORY_CARE_PROVIDER_SITE_OTHER): Payer: PPO

## 2023-04-02 VITALS — Ht 62.0 in | Wt 138.0 lb

## 2023-04-02 DIAGNOSIS — Z0000 Encounter for general adult medical examination without abnormal findings: Secondary | ICD-10-CM | POA: Diagnosis not present

## 2023-04-02 NOTE — Patient Instructions (Addendum)
 Lindsay Henderson , Thank you for taking time to come for your Medicare Wellness Visit. I appreciate your ongoing commitment to your health goals. Please review the following plan we discussed and let me know if I can assist you in the future.   Referrals/Orders/Follow-Ups/Clinician Recommendations: Aim for 30 minutes of exercise or brisk walking, 6-8 glasses of water, and 5 servings of fruits and vegetables each day. Educated and advised on getting the Shingles, Pneumonia, Tdap, and Hepatitis C vaccines.  Appointment with Dr Lafe Garin (Endocrinologist) in 2025 for routine diabetic foot exam.  This is a list of the screening recommended for you and due dates:  Health Maintenance  Topic Date Due   Pneumonia Vaccine (1 of 2 - PCV) Never done   Hepatitis C Screening  Never done   DTaP/Tdap/Td vaccine (1 - Tdap) Never done   Zoster (Shingles) Vaccine (1 of 2) Never done   COVID-19 Vaccine (3 - 2024-25 season) 09/10/2022   Yearly kidney function blood test for diabetes  03/02/2023   Yearly kidney health urinalysis for diabetes  03/02/2023   Eye exam for diabetics  05/10/2023   Hemoglobin A1C  08/07/2023   Complete foot exam   02/07/2024   Medicare Annual Wellness Visit  04/01/2024   Colon Cancer Screening  06/24/2024   Mammogram  02/01/2025   DEXA scan (bone density measurement)  Completed   HPV Vaccine  Aged Out   Flu Shot  Discontinued    Advanced directives: (Provided) Advance directive discussed with you today. I have provided a copy for you to complete at home and have notarized. Once this is complete, please bring a copy in to our office so we can scan it into your chart.   Next Medicare Annual Wellness Visit scheduled for next year: Yes

## 2023-04-02 NOTE — Progress Notes (Signed)
 Subjective:   Lindsay Henderson is a 68 y.o. who presents for a Medicare Wellness preventive visit.  Visit Complete: Virtual I connected with  Lindsay Henderson on 04/02/23 by a audio enabled telemedicine application and verified that I am speaking with the correct person using two identifiers.  Patient Location: Home  Provider Location: Office/Clinic  I discussed the limitations of evaluation and management by telemedicine. The patient expressed understanding and agreed to proceed.  Vital Signs: Because this visit was a virtual/telehealth visit, some criteria may be missing or patient reported. Any vitals not documented were not able to be obtained and vitals that have been documented are patient reported.  VideoDeclined- This patient declined Librarian, academic. Therefore the visit was completed with audio only.  Persons Participating in Visit: Patient.  AWV Questionnaire: No: Patient Medicare AWV questionnaire was not completed prior to this visit.  Cardiac Risk Factors include: advanced age (>73men, >37 women);diabetes mellitus;dyslipidemia     Objective:    Today's Vitals   04/02/23 0932  Weight: 138 lb (62.6 kg)  Height: 5\' 2"  (1.575 m)   Body mass index is 25.24 kg/m.     04/02/2023    9:31 AM 03/29/2022    9:19 AM 03/09/2021    8:46 AM 04/29/2015    2:34 PM 06/25/2014    9:00 AM 06/11/2014    3:41 PM  Advanced Directives  Does Patient Have a Medical Advance Directive? No No No No No No  Would patient like information on creating a medical advance directive? Yes (MAU/Ambulatory/Procedural Areas - Information given) No - Patient declined No - Patient declined No - patient declined information      Current Medications (verified) Outpatient Encounter Medications as of 04/02/2023  Medication Sig   albuterol (VENTOLIN HFA) 108 (90 Base) MCG/ACT inhaler Inhale 2 puffs into the lungs every 6 (six) hours as needed for wheezing or shortness of  breath.   budesonide-formoterol (SYMBICORT) 80-4.5 MCG/ACT inhaler Inhale 2 puffs into the lungs 2 (two) times daily.   clotrimazole-betamethasone (LOTRISONE) cream Apply 1 application topically 2 (two) times daily.   cyclobenzaprine (FLEXERIL) 10 MG tablet Take 1 tablet (10 mg total) by mouth 3 (three) times daily as needed for muscle spasms.   dapagliflozin propanediol (FARXIGA) 5 MG TABS tablet Take 1 tablet (5 mg total) by mouth daily.   esomeprazole (NEXIUM) 40 MG capsule Take 1 capsule by mouth once daily   estradiol (ESTRACE VAGINAL) 0.1 MG/GM vaginal cream Use nightly as directed x 2 weeks; then use 2 x per week for maintenance   ezetimibe (ZETIA) 10 MG tablet Take 1 tablet (10 mg total) by mouth daily.   fexofenadine ([name]) 180 MG tablet Take 180 mg by mouth daily.   fluticasone (FLONASE) 50 MCG/ACT nasal spray Place 2 sprays into both nostrils daily.   glimepiride (AMARYL) 1 MG tablet Take 1 tablet (1 mg total) by mouth daily with breakfast.   metFORMIN (GLUCOPHAGE-XR) 500 MG 24 hr tablet TAKE 4 TABLETS BY MOUTH ONCE DAILY WITH BREAKFAST   Pitavastatin Calcium 2 MG TABS Take 1 tablet (2 mg total) by mouth daily.   Semaglutide (RYBELSUS) 7 MG TABS Take 1 tablet (7 mg total) by mouth daily.   tobramycin (TOBREX) 0.3 % ophthalmic solution 1 drop 4 (four) times daily.   [DISCONTINUED] chlorpheniramine-HYDROcodone (TUSSIONEX) 10-8 MG/5ML Take 5 mLs by mouth every 12 (twelve) hours as needed for cough.   [DISCONTINUED] nitrofurantoin, macrocrystal-monohydrate, (MACROBID) 100 MG capsule Take 1 capsule (  100 mg total) by mouth 2 (two) times daily.   No facility-administered encounter medications on file as of 04/02/2023.    Allergies (verified) Parlodel [bromocriptine mesylate], Peanut-containing drug products, Penicillins, Pioglitazone, Sulfa antibiotics, and Zithromax [azithromycin]   History: Past Medical History:  Diagnosis Date   Adenomatous colon polyp 11/2005   Allergic rhinitis     Allergy    Anomalous atrioventricular excitation    Anxiety    no per pt   Asthma    Diverticulosis of colon    Gastritis    GERD (gastroesophageal reflux disease)    History of supraventricular tachycardia    Hypercholesteremia    Kidney stones    Nephrolithiasis    Renal cyst    Somatic dysfunction    Type II or unspecified type diabetes mellitus without mention of complication, not stated as uncontrolled    Vitamin D deficiency    Past Surgical History:  Procedure Laterality Date   CARDIAC ELECTROPHYSIOLOGY MAPPING AND ABLATION  2004   COLONOSCOPY  2007   TUBAL LIGATION     Family History  Problem Relation Age of Onset   Diabetes Father    Hypertension Father    Hyperlipidemia Father    Breast cancer Mother    Esophageal cancer Neg Hx    Stomach cancer Neg Hx    Colon cancer Neg Hx    Rectal cancer Neg Hx    Social History   Socioeconomic History   Marital status: Married    Spouse name: Not on file   Number of children: 4   Years of education: Not on file   Highest education level: Not on file  Occupational History   Occupation: [employer]  Tobacco Use   Smoking status: Never    Passive exposure: Never   Smokeless tobacco: Never  Substance and Sexual Activity   Alcohol use: No   Drug use: No   Sexual activity: Not Currently  Other Topics Concern   Not on file  Social History Narrative   Not on file   Social Drivers of Health   Financial Resource Strain: Low Risk  (04/02/2023)   Overall Financial Resource Strain (CARDIA)    Difficulty of Paying Living Expenses: Not hard at all  Food Insecurity: No Food Insecurity (04/02/2023)   Hunger Vital Sign    Worried About Running Out of Food in the Last Year: Never true    Ran Out of Food in the Last Year: Never true  Transportation Needs: No Transportation Needs (04/02/2023)   PRAPARE - Administrator, Civil Service (Medical): No    Lack of Transportation (Non-Medical): No  Physical  Activity: Insufficiently Active (04/02/2023)   Exercise Vital Sign    Days of Exercise per Week: 3 days    Minutes of Exercise per Session: 30 min  Stress: No Stress Concern Present (04/02/2023)   [employer] of Occupational Health - Occupational Stress Questionnaire    Feeling of Stress : Not at all  Social Connections: Socially Integrated (04/02/2023)   Social Connection and Isolation Panel [NHANES]    Frequency of Communication with Friends and Family: More than three times a week    Frequency of Social Gatherings with Friends and Family: More than three times a week    Attends Religious Services: More than 4 times per year    Active Member of [employer] or Organizations: Yes    Attends Banker Meetings: More than 4 times per year    Marital Status:  Married    Tobacco Counseling Counseling given: No    Clinical Intake:  Pre-visit preparation completed: Yes  Pain : No/denies pain     BMI - recorded: 25.24 Nutritional Status: BMI 25 -29 Overweight Nutritional Risks: None Diabetes: Yes CBG done?: Yes (fasting today - 115) CBG resulted in Enter/ Edit results?: Yes (fasting results - 115 this morning) Did pt. bring in CBG monitor from home?: No  Lab Results  Component Value Date   HGBA1C 9.0 (A) 02/07/2023   HGBA1C 8.2 (A) 10/04/2022   HGBA1C 8.0 (A) 05/26/2022     How often do you need to have someone help you when you read instructions, pamphlets, or other written materials from your doctor or pharmacy?: 1 - Never  Interpreter Needed?: No  Information entered by :: Hassell Halim, CMA   Activities of Daily Living     04/02/2023    9:35 AM  In your present state of health, do you have any difficulty performing the following activities:  Hearing? 0  Vision? 0  Difficulty concentrating or making decisions? 0  Walking or climbing stairs? 0  Dressing or bathing? 0  Doing errands, shopping? 0  Preparing Food and eating ? N  Using the Toilet? N  In  the past six months, have you accidently leaked urine? Y  Comment wears a pantyliner  Do you have problems with loss of bowel control? N  Managing your Medications? N  Managing your Finances? N  Housekeeping or managing your Housekeeping? N    Patient Care Team: Myrlene Broker, MD as PCP - General (Internal Medicine) Maxie Better, MD as Consulting Physician (Obstetrics and Gynecology) Billie Ruddy, OD as Referring Physician (Optometry) Stephannie Li, MD as Consulting Physician (Ophthalmology) Carlus Pavlov, MD as Consulting Physician (Endocrinology)  Indicate any recent Medical Services you may have received from other than [facility] providers in the past year (date may be approximate).     Assessment:   This is a routine wellness examination for Lindsay Henderson.  Hearing/Vision screen Hearing Screening - Comments:: Wears bilateral hearing aids Vision Screening - Comments:: Wears rx glasses - up to date with routine eye exams with [facility]   Goals Addressed               This Visit's Progress     Patient Stated (pt-stated)        Patient stated she plans to exercise more.       Depression Screen     04/02/2023    9:38 AM 03/29/2022    9:20 AM 03/01/2022    8:18 AM 08/12/2021    2:18 PM 03/09/2021    8:48 AM 02/17/2021   10:23 AM 02/10/2020    8:13 AM  PHQ 2/9 Scores  PHQ - 2 Score 0 0 0 0 0 0 0  PHQ- 9 Score 0 0 0        Fall Risk     04/02/2023    9:36 AM 01/24/2023    9:36 AM 12/26/2022    3:57 PM 08/18/2022    2:48 PM 03/29/2022    9:20 AM  Fall Risk   Falls in the past year? 0 0 0 0 0  Number falls in past yr: 0 0 0 0 0  Injury with Fall? 0 0 0 0 0  Risk for fall due to : No Fall Risks    No Fall Risks  Follow up Falls prevention discussed;Falls evaluation completed Falls evaluation completed Falls evaluation completed Falls  evaluation completed Falls prevention discussed    MEDICARE RISK AT HOME:  Medicare Risk at Home Any stairs in or around  the home?: Yes If so, are there any without handrails?: No Home free of loose throw rugs in walkways, pet beds, electrical cords, etc?: Yes Adequate lighting in your home to reduce risk of falls?: Yes Life alert?: Yes Use of a cane, walker or w/c?: No Grab bars in the bathroom?: No Shower chair or bench in shower?: No Elevated toilet seat or a handicapped toilet?: Yes  TIMED UP AND GO:  Was the test performed?  No  Cognitive Function: 6CIT completed        04/02/2023    9:38 AM 03/29/2022    9:23 AM  6CIT Screen  What Year? 0 points 0 points  What month? 0 points 0 points  What time? 0 points 0 points  Count back from 20 0 points 0 points  Months in reverse 0 points 0 points  Repeat phrase 0 points 0 points  Total Score 0 points 0 points    Immunizations Immunization History  Administered Date(s) Administered   Influenza Whole 10/18/2009   Influenza-Unspecified 10/15/2014   PFIZER(Purple Top)SARS-COV-2 Vaccination 03/27/2019, 04/24/2019    Screening Tests Health Maintenance  Topic Date Due   Pneumonia Vaccine 88+ Years old (1 of 2 - PCV) Never done   Hepatitis C Screening  Never done   DTaP/Tdap/Td (1 - Tdap) Never done   Zoster Vaccines- Shingrix (1 of 2) Never done   COVID-19 Vaccine (3 - 2024-25 season) 09/10/2022   Diabetic kidney evaluation - eGFR measurement  03/02/2023   Diabetic kidney evaluation - Urine ACR  03/02/2023   OPHTHALMOLOGY EXAM  05/10/2023   HEMOGLOBIN A1C  08/07/2023   FOOT EXAM  02/07/2024   Medicare Annual Wellness (AWV)  04/01/2024   Colonoscopy  06/24/2024   MAMMOGRAM  02/01/2025   DEXA SCAN  Completed   HPV VACCINES  Aged Out   INFLUENZA VACCINE  Discontinued    Health Maintenance  Health Maintenance Due  Topic Date Due   Pneumonia Vaccine 54+ Years old (1 of 2 - PCV) Never done   Hepatitis C Screening  Never done   DTaP/Tdap/Td (1 - Tdap) Never done   Zoster Vaccines- Shingrix (1 of 2) Never done   COVID-19 Vaccine (3 -  2024-25 season) 09/10/2022   Diabetic kidney evaluation - eGFR measurement  03/02/2023   Diabetic kidney evaluation - Urine ACR  03/02/2023   Health Maintenance Items Addressed: 04/02/2023 Foot Exam status: Completed 02/07/2023. Pt stated next appt due in 05/2023.  Additional Screening:  Vision Screening: Recommended annual ophthalmology exams for early detection of glaucoma and other disorders of the eye.  Dental Screening: Recommended annual dental exams for proper oral hygiene  Community Resource Referral / Chronic Care Management: CRR required this visit?  No   CCM required this visit?  No     Plan:     I have personally reviewed and noted the following in the patient's chart:   Medical and social history Use of alcohol, tobacco or illicit drugs  Current medications and supplements including opioid prescriptions. Patient is not currently taking opioid prescriptions. Functional ability and status Nutritional status Physical activity Advanced directives List of other physicians Hospitalizations, surgeries, and ER visits in previous 12 months Vitals Screenings to include cognitive, depression, and falls Referrals and appointments  In addition, I have reviewed and discussed with patient certain preventive protocols, quality metrics, and best practice  recommendations. A written personalized care plan for preventive services as well as general preventive health recommendations were provided to patient.     Darreld Mclean, CMA   04/02/2023   After Visit Summary: (MyChart) Due to this being a telephonic visit, the after visit summary with patients personalized plan was offered to patient via MyChart   Notes: Nothing significant to report at this time.

## 2023-04-04 ENCOUNTER — Telehealth: Payer: Self-pay | Admitting: Internal Medicine

## 2023-04-04 ENCOUNTER — Ambulatory Visit (INDEPENDENT_AMBULATORY_CARE_PROVIDER_SITE_OTHER): Payer: PPO | Admitting: Internal Medicine

## 2023-04-04 ENCOUNTER — Encounter: Payer: Self-pay | Admitting: Internal Medicine

## 2023-04-04 VITALS — BP 120/80 | HR 103 | Temp 98.3°F | Ht 62.0 in | Wt 139.0 lb

## 2023-04-04 DIAGNOSIS — K219 Gastro-esophageal reflux disease without esophagitis: Secondary | ICD-10-CM

## 2023-04-04 DIAGNOSIS — E559 Vitamin D deficiency, unspecified: Secondary | ICD-10-CM | POA: Diagnosis not present

## 2023-04-04 DIAGNOSIS — E785 Hyperlipidemia, unspecified: Secondary | ICD-10-CM | POA: Diagnosis not present

## 2023-04-04 DIAGNOSIS — E1169 Type 2 diabetes mellitus with other specified complication: Secondary | ICD-10-CM

## 2023-04-04 DIAGNOSIS — Z0000 Encounter for general adult medical examination without abnormal findings: Secondary | ICD-10-CM

## 2023-04-04 DIAGNOSIS — E113593 Type 2 diabetes mellitus with proliferative diabetic retinopathy without macular edema, bilateral: Secondary | ICD-10-CM

## 2023-04-04 DIAGNOSIS — J4521 Mild intermittent asthma with (acute) exacerbation: Secondary | ICD-10-CM

## 2023-04-04 DIAGNOSIS — Z7984 Long term (current) use of oral hypoglycemic drugs: Secondary | ICD-10-CM | POA: Diagnosis not present

## 2023-04-04 LAB — LIPID PANEL
Cholesterol: 213 mg/dL — ABNORMAL HIGH (ref 0–200)
HDL: 51.4 mg/dL (ref >39.00)
LDL Cholesterol: 138 mg/dL — ABNORMAL HIGH (ref 0–99)
NonHDL: 161.69
Total CHOL/HDL Ratio: 4
Triglycerides: 117 mg/dL (ref 0.0–149.0)
VLDL: 23.4 mg/dL (ref 0.0–40.0)

## 2023-04-04 LAB — CBC
HCT: 41.8 % (ref 36.0–46.0)
Hemoglobin: 13.9 g/dL (ref 12.0–15.0)
MCHC: 33.2 g/dL (ref 30.0–36.0)
MCV: 87.9 fl (ref 78.0–100.0)
Platelets: 411 10*3/uL — ABNORMAL HIGH (ref 150.0–400.0)
RBC: 4.75 Mil/uL (ref 3.87–5.11)
RDW: 14.2 % (ref 11.5–15.5)
WBC: 8.3 10*3/uL (ref 4.0–10.5)

## 2023-04-04 LAB — COMPREHENSIVE METABOLIC PANEL WITH GFR
ALT: 14 U/L (ref 0–35)
AST: 15 U/L (ref 0–37)
Albumin: 4.3 g/dL (ref 3.5–5.2)
Alkaline Phosphatase: 73 U/L (ref 39–117)
BUN: 14 mg/dL (ref 6–23)
CO2: 30 meq/L (ref 19–32)
Calcium: 9.3 mg/dL (ref 8.4–10.5)
Chloride: 102 meq/L (ref 96–112)
Creatinine, Ser: 0.68 mg/dL (ref 0.40–1.20)
GFR: 89.37 mL/min (ref >60.00)
Glucose, Bld: 136 mg/dL — ABNORMAL HIGH (ref 70–99)
Potassium: 4.2 meq/L (ref 3.5–5.1)
Sodium: 140 meq/L (ref 135–145)
Total Bilirubin: 0.4 mg/dL (ref 0.2–1.2)
Total Protein: 7.5 g/dL (ref 6.0–8.3)

## 2023-04-04 LAB — MICROALBUMIN / CREATININE URINE RATIO
Creatinine,U: 58.5 mg/dL
Microalb Creat Ratio: 21 mg/g (ref 0.0–30.0)
Microalb, Ur: 1.2 mg/dL (ref 0.0–1.9)

## 2023-04-04 MED ORDER — ESTRADIOL 0.1 MG/GM VA CREA: TOPICAL_CREAM | VAGINAL | 11 refills | Status: AC

## 2023-04-04 MED ORDER — CYCLOBENZAPRINE HCL 10 MG PO TABS: 10.0000 mg | ORAL_TABLET | Freq: Three times a day (TID) | ORAL | 0 refills | Status: AC | PRN

## 2023-04-04 MED ORDER — ESOMEPRAZOLE MAGNESIUM 40 MG PO CPDR: 40.0000 mg | DELAYED_RELEASE_CAPSULE | Freq: Every day | ORAL | 3 refills | Status: AC

## 2023-04-04 MED ORDER — PITAVASTATIN CALCIUM 2 MG PO TABS: 2.0000 mg | ORAL_TABLET | Freq: Every day | ORAL | 3 refills | Status: AC

## 2023-04-04 MED ORDER — ALBUTEROL SULFATE HFA 108 (90 BASE) MCG/ACT IN AERS: 2.0000 | INHALATION_SPRAY | Freq: Four times a day (QID) | RESPIRATORY_TRACT | 3 refills | Status: AC | PRN

## 2023-04-04 NOTE — Assessment & Plan Note (Signed)
 Uses otc prn and controlled.

## 2023-04-04 NOTE — Assessment & Plan Note (Signed)
 Checking lipid panel and adjust pitavastatin 2 mg daily as needed. She is not taking daily currently due to some side effects.

## 2023-04-04 NOTE — Assessment & Plan Note (Signed)
 No flare today. Using symbicort prn and albuterol prn. Taking allegra for allergies currently.

## 2023-04-04 NOTE — Assessment & Plan Note (Signed)
 Checking lipid panel and CMP and microalbumin creatinine ratio. She was not at goal most recently and started [place] which she is doing well with. Her morning sugars are down closer to 130 so expect improvement with upcoming visit with endo next month. Continue metformin and amaryl and farxiga. If not at goal would recommend titration of dosing farxiga since she is not having any side effects with this.

## 2023-04-04 NOTE — Telephone Encounter (Signed)
**Note De-identified  [place] Obfuscation** Please advise 

## 2023-04-04 NOTE — Progress Notes (Signed)
   Subjective:   Patient ID: Lindsay Henderson, female    DOB: 04-25-1955, 68 y.o.   MRN: [id number]  HPI The patient is here for physical.  PMH, [facility], social history reviewed and updated  Review of Systems  Constitutional: Negative.   HENT: Negative.    Eyes: Negative.   Respiratory:  Negative for cough, chest tightness and shortness of breath.   Cardiovascular:  Negative for chest pain, palpitations and leg swelling.  Gastrointestinal:  Negative for abdominal distention, abdominal pain, constipation, diarrhea, nausea and vomiting.  Musculoskeletal: Negative.   Skin: Negative.   Neurological: Negative.   Psychiatric/Behavioral: Negative.      Objective:  Physical Exam Constitutional:      Appearance: She is well-developed.  HENT:     Head: Normocephalic and atraumatic.  Cardiovascular:     Rate and Rhythm: Normal rate and regular rhythm.  Pulmonary:     Effort: Pulmonary effort is normal. No respiratory distress.     Breath sounds: Normal breath sounds. No wheezing or rales.  Abdominal:     General: Bowel sounds are normal. There is no distension.     Palpations: Abdomen is soft.     Tenderness: There is no abdominal tenderness. There is no rebound.  Musculoskeletal:     Cervical back: Normal range of motion.  Skin:    General: Skin is warm and dry.     Comments: Foot exam done  Neurological:     Mental Status: She is alert and oriented to person, place, and time.     Coordination: Coordination normal.     Vitals:   04/04/23 0804  BP: 120/80  Pulse: (!) 103  Temp: 98.3 F (36.8 C)  TempSrc: Oral  SpO2: 96%  Weight: 139 lb (63 kg)  Height: 5\' 2"  (1.575 m)    Assessment & Plan:

## 2023-04-04 NOTE — Telephone Encounter (Signed)
 Pea sized amount 2 times per week

## 2023-04-04 NOTE — Telephone Encounter (Signed)
 Copied from CRM [id number]. Topic: Clinical - Prescription Issue >> Apr 04, 2023  9:51 AM Kathryne Eriksson wrote: Reason for CRM: estradiol (ESTRACE VAGINAL) 0.1 MG/GM vaginal cream >> Apr 04, 2023  9:53 AM Kathryne Eriksson wrote: [facility] [phone] called on behalf of patient's prescription. States they're needing to know the EXACT dosage, if they don't have a calculated dosage amount the insurance will audit it.

## 2023-04-04 NOTE — Assessment & Plan Note (Signed)
 Flu shot declines. Pneumonia declines counseled. Shingrix declines. Tetanus declines. Colonoscopy up to date. Mammogram up to date, pap smear aged out and dexa up to date. Counseled about sun safety and mole surveillance. Counseled about the dangers of distracted driving. Given 10 year screening recommendations.

## 2023-04-04 NOTE — Assessment & Plan Note (Signed)
Checking vitamin D level and adjust as needed.  

## 2023-04-05 LAB — VITAMIN B12: Vitamin B-12: 222 pg/mL (ref 211–911)

## 2023-04-05 LAB — VITAMIN D 25 HYDROXY (VIT D DEFICIENCY, FRACTURES): VITD: 55.26 ng/mL (ref 30.00–100.00)

## 2023-04-06 ENCOUNTER — Telehealth: Payer: Self-pay

## 2023-04-06 ENCOUNTER — Other Ambulatory Visit (HOSPITAL_COMMUNITY): Payer: Self-pay

## 2023-04-06 NOTE — Telephone Encounter (Signed)
 Pharmacy Patient Advocate Encounter   Received notification from CoverMyMeds that prior authorization for Cyclobenzaprine HCl 10MG  tablets is required/requested.   Insurance verification completed.   The patient is insured through [facility] ADVANTAGE/RX ADVANCE .   Per test claim: PA required; PA started via CoverMyMeds. KEY [id number] . Waiting for clinical questions to populate.

## 2023-04-06 NOTE — Telephone Encounter (Unsigned)
 Copied from CRM [id number]. Topic: Clinical - Prescription Issue >> Apr 04, 2023  9:51 AM Kathryne Eriksson wrote: Reason for CRM: estradiol (ESTRACE VAGINAL) 0.1 MG/GM vaginal cream >> Apr 06, 2023 10:17 AM Truddie Crumble wrote: [facility] called stating they need clarification on the patient medication estradiol. I contacted [facility] and they told me the doctor was not in today and to let [facility] know by the message that was in the patient chart that the doctor stated for the patient to take a pea sized amount. I relayed the message to [facility] and they stated they need to know what a pea sized amount is per grams nightly CB [phone] >> Apr 04, 2023  9:53 AM Kathryne Eriksson wrote: [facility] [phone] called on behalf of patient's prescription. States they're needing to know the EXACT dosage, if they don't have a calculated dosage amount the insurance will audit it.

## 2023-04-09 ENCOUNTER — Encounter: Payer: Self-pay | Admitting: Internal Medicine

## 2023-04-09 ENCOUNTER — Other Ambulatory Visit (HOSPITAL_COMMUNITY): Payer: Self-pay

## 2023-04-09 NOTE — Telephone Encounter (Signed)
 Pharmacy Patient Advocate Encounter  Received notification from [facility] ADVANTAGE/RX ADVANCE that Prior Authorization for Cyclobenzaprine HCl 10MG  tablets  has been APPROVED from 04/03/2023 to 05/04/2023 Ran test claim, Copay is $1.32. I HAVE contacted the pharmacy to rerun and for them to contact the patient when it is ready.   PA #/Case ID/Reference #: [id number]

## 2023-04-09 NOTE — Telephone Encounter (Signed)
 Can pharmacy help with this?

## 2023-04-11 NOTE — Telephone Encounter (Signed)
 PLEASE BE ADVISED APPROVAL LETTER HAS BEEN SCANNED IN MEDIA OF CHART

## 2023-04-18 DIAGNOSIS — E113512 Type 2 diabetes mellitus with proliferative diabetic retinopathy with macular edema, left eye: Secondary | ICD-10-CM | POA: Diagnosis not present

## 2023-04-23 NOTE — Telephone Encounter (Signed)
 This has already been addressed, pharmacy was called and relayed message back

## 2023-05-09 ENCOUNTER — Encounter: Payer: Self-pay | Admitting: Internal Medicine

## 2023-05-09 ENCOUNTER — Ambulatory Visit: Payer: PPO | Admitting: Internal Medicine

## 2023-05-09 VITALS — BP 120/60 | HR 93 | Ht 62.0 in | Wt 137.6 lb

## 2023-05-09 DIAGNOSIS — E1369 Other specified diabetes mellitus with other specified complication: Secondary | ICD-10-CM | POA: Diagnosis not present

## 2023-05-09 DIAGNOSIS — E785 Hyperlipidemia, unspecified: Secondary | ICD-10-CM

## 2023-05-09 DIAGNOSIS — E13319 Other specified diabetes mellitus with unspecified diabetic retinopathy without macular edema: Secondary | ICD-10-CM | POA: Diagnosis not present

## 2023-05-09 DIAGNOSIS — E1169 Type 2 diabetes mellitus with other specified complication: Secondary | ICD-10-CM

## 2023-05-09 LAB — POCT GLYCOSYLATED HEMOGLOBIN (HGB A1C): Hemoglobin A1C: 7.8 % — AB (ref 4.0–5.6)

## 2023-05-09 MED ORDER — DAPAGLIFLOZIN PROPANEDIOL 10 MG PO TABS: 10.0000 mg | ORAL_TABLET | Freq: Every day | ORAL | 3 refills | Status: AC

## 2023-05-09 NOTE — Patient Instructions (Addendum)
 Please continue: - Metformin  ER 1000 mg 2x a day - Amaryl  1 mg  before lunch (2 mg while on steroids)  Please increase: - Farxiga  10 mg before b'fast  Please return in 3-4 months with your sugar log.

## 2023-05-09 NOTE — Progress Notes (Signed)
 Patient ID: Lindsay Henderson, female   DOB: 1955/03/20, 68 y.o.   MRN: [id number]  HPI: Lindsay Henderson is a 68 y.o.-year-old female, returning for follow-up for LADA , dx 07/2021, prev. DM2, dx in 2000 (had gestational diabetes in 77 in 1992), non-insulin-dependent, uncontrolled, with complications (DR, macular edema). Pt. previously saw Dr. Washington Hacker, but last visit with me 3 months ago.  Interim history: No increased urination, nausea, chest pain. She was able to start Farxiga  since last visit.  No dysuria or yeast infections.  Reviewed HbA1c: Lab Results  Component Value Date   HGBA1C 9.0 (A) 02/07/2023   HGBA1C 8.2 (A) 10/04/2022   HGBA1C 8.0 (A) 05/26/2022   HGBA1C 8.6 (H) 03/01/2022   HGBA1C 7.9 (A) 11/08/2021   HGBA1C 8.7 (A) 07/28/2021   HGBA1C 8.4 (A) 05/12/2021   HGBA1C 8.5 (A) 03/03/2021   HGBA1C 8.7 (H) 02/11/2021   HGBA1C 8.3 (A) 11/26/2020   Pt is on a regimen of: - Metformin  ER 1000 mg 2x a day - Amaryl  1-2 mg with breakfast >> before lunch (higher dose with steroids) - Farxiga  5 mg before breakfast She previously had vaginitis with Jardiance , despite significant improvement in her blood sugars while on it. Invokana  was not affordable. She was previously on repaglinide  before changing to glimepiride . She tried bromocriptine  and Rybelsus  and they caused occasional gas pains.  She tried again Rybelsus  in 2023 but developed increased GERD and had to stop. She had diarrhea and tremors with Actos . She refused insulin.  Pt checks her sugars 1-3x a day and they are: - am: 98, 131-166, 176 >> 147-173, 227 >> 117-139, 182 - 2h after b'fast: 151, 167 >> n/c >> 117-163 >> n/c >> 126-148 - before lunch:  140-150 >> 93 >> 175-180 >> 86, 146, 186 - 2h after lunch: 150-170 >> 175-180 >> 121, 130 >> n/c - before dinner: 150-170 >> 108 >> 165 >> 117, 124, 238 - 2h after dinner: 170-180 >> 160s >> 150-175 >> 175-180 >> n/c - bedtime:  141  >> 180-200 >> 195 >> 120-168, 186  >> 220 - nighttime: n/c Lowest sugar was 80s 79 >> 86; she has hypoglycemia awareness at 70.  Highest sugar was 200s >> 400s (steroids) >> 238      Glucometer: ReliOn  - no CKD, last BUN/creatinine:  Lab Results  Component Value Date   BUN 14 04/04/2023   BUN 15 03/01/2022   CREATININE 0.68 04/04/2023   CREATININE 0.73 03/01/2022   Lab Results  Component Value Date   MICRALBCREAT 21.0 04/04/2023   MICRALBCREAT 2.4 03/01/2022   MICRALBCREAT 1.7 02/11/2021   MICRALBCREAT 1.9 02/10/2020   MICRALBCREAT 1.3 10/23/2018   MICRALBCREAT 1.3 12/17/2017   MICRALBCREAT 1.2 11/01/2016   MICRALBCREAT 1.5 04/15/2015   MICRALBCREAT 10.3 03/13/2013   MICRALBCREAT 1.0 02/15/2012  She is not on ACE inhibitor/ARB  -+ HL; last set of lipids: Lab Results  Component Value Date   CHOL 213 (H) 04/04/2023   HDL 51.40 04/04/2023   LDLCALC 138 (H) 04/04/2023   LDLDIRECT 152.7 08/26/2012   TRIG 117.0 04/04/2023   CHOLHDL 4 04/04/2023  She is on Livalo  2 mg occasionally due the muscle aches. Prev. On Crestor  >> mm aches.  She restarted Zetia  10 mg daily 07/2021 - inconsistently.  - last eye exam was in 04/2023. Improving + PDR B, + macular edema. Dr. Elnita Hai - IO injections in OS.  - no numbness and tingling in her feet.  Last foot exam 02/07/2023.  Patient also has a history of GERD, renal stones, asthma, history of SVT, vitamin D  deficiency, anxiety.  ROS: + see HPI  Past Medical History:  Diagnosis Date   Adenomatous colon polyp 11/2005   Allergic rhinitis    Allergy    Anomalous atrioventricular excitation    Anxiety    no per pt   Asthma    Diverticulosis of colon    Gastritis    GERD (gastroesophageal reflux disease)    History of supraventricular tachycardia    Hypercholesteremia    Kidney stones    Nephrolithiasis    Renal cyst    Somatic dysfunction    Type II or unspecified type diabetes mellitus without mention of complication, not stated as uncontrolled    Vitamin D   deficiency    Past Surgical History:  Procedure Laterality Date   CARDIAC ELECTROPHYSIOLOGY MAPPING AND ABLATION  2004   COLONOSCOPY  2007   TUBAL LIGATION     Social History   Socioeconomic History   Marital status: Married    Spouse name: Not on file   Number of children: 4   Years of education: Not on file   Highest education level: Not on file  Occupational History   Occupation: [employer]  Tobacco Use   Smoking status: Never    Passive exposure: Never   Smokeless tobacco: Never  Substance and Sexual Activity   Alcohol use: No   Drug use: No   Sexual activity: Not Currently  Other Topics Concern   Not on file  Social History Narrative   Not on file   Social Drivers of Health   Financial Resource Strain: Low Risk  (04/02/2023)   Overall Financial Resource Strain (CARDIA)    Difficulty of Paying Living Expenses: Not hard at all  Food Insecurity: No Food Insecurity (04/02/2023)   Hunger Vital Sign    Worried About Running Out of Food in the Last Year: Never true    Ran Out of Food in the Last Year: Never true  Transportation Needs: No Transportation Needs (04/02/2023)   PRAPARE - Administrator, Civil Service (Medical): No    Lack of Transportation (Non-Medical): No  Physical Activity: Insufficiently Active (04/02/2023)   Exercise Vital Sign    Days of Exercise per Week: 3 days    Minutes of Exercise per Session: 30 min  Stress: No Stress Concern Present (04/02/2023)   [employer] of Occupational Health - Occupational Stress Questionnaire    Feeling of Stress : Not at all  Social Connections: Socially Integrated (04/02/2023)   Social Connection and Isolation Panel [NHANES]    Frequency of Communication with Friends and Family: More than three times a week    Frequency of Social Gatherings with Friends and Family: More than three times a week    Attends Religious Services: More than 4 times per year    Active Member of [employer] or Organizations:  Yes    Attends Engineer, structural: More than 4 times per year    Marital Status: Married  Catering manager Violence: Not At Risk (04/02/2023)   Humiliation, Afraid, Rape, and Kick questionnaire    Fear of Current or Ex-Partner: No    Emotionally Abused: No    Physically Abused: No    Sexually Abused: No   Current Outpatient Medications on File Prior to Visit  Medication Sig Dispense Refill   albuterol  (VENTOLIN  HFA) 108 (90 Base) MCG/ACT inhaler Inhale 2 puffs into the lungs every 6 (  six) hours as needed for wheezing or shortness of breath. 8 g 3   budesonide -formoterol  (SYMBICORT ) 80-4.5 MCG/ACT inhaler Inhale 2 puffs into the lungs 2 (two) times daily. 1 each 3   clotrimazole -betamethasone  (LOTRISONE ) cream Apply 1 application topically 2 (two) times daily. 45 g 2   cyclobenzaprine  (FLEXERIL ) 10 MG tablet Take 1 tablet (10 mg total) by mouth 3 (three) times daily as needed for muscle spasms. 45 tablet 0   dapagliflozin  propanediol (FARXIGA ) 5 MG TABS tablet Take 1 tablet (5 mg total) by mouth daily. 90 tablet 3   esomeprazole  (NEXIUM ) 40 MG capsule Take 1 capsule (40 mg total) by mouth daily. 90 capsule 3   estradiol  (ESTRACE  VAGINAL) 0.1 MG/GM vaginal cream Use nightly as directed x 2 weeks; then use 2 x per week for maintenance 42.5 g 11   ezetimibe  (ZETIA ) 10 MG tablet Take 1 tablet (10 mg total) by mouth daily. 90 tablet 3   fexofenadine ([name]) 180 MG tablet Take 180 mg by mouth daily.     fluticasone  (FLONASE) 50 MCG/ACT nasal spray Place 2 sprays into both nostrils daily.     glimepiride  (AMARYL ) 1 MG tablet Take 1 tablet (1 mg total) by mouth daily with breakfast. 90 tablet 3   metFORMIN  (GLUCOPHAGE -XR) 500 MG 24 hr tablet TAKE 4 TABLETS BY MOUTH ONCE DAILY WITH BREAKFAST 360 tablet 3   Pitavastatin  Calcium  2 MG TABS Take 1 tablet (2 mg total) by mouth daily. 90 tablet 3   tobramycin (TOBREX) 0.3 % ophthalmic solution 1 drop 4 (four) times daily.     No current  facility-administered medications on file prior to visit.   Allergies  Allergen Reactions   Parlodel  [Bromocriptine  Mesylate] Nausea Only    nausea   Peanut-Containing Drug Products Hives, Itching and Swelling   Penicillins     REACTION: rash and sob   Pioglitazone  Diarrhea    tremor   Sulfa Antibiotics Hives   Zithromax  [Azithromycin ] Nausea And Vomiting   Family History  Problem Relation Age of Onset   Diabetes Father    Hypertension Father    Hyperlipidemia Father    Breast cancer Mother    Esophageal cancer Neg Hx    Stomach cancer Neg Hx    Colon cancer Neg Hx    Rectal cancer Neg Hx    PE: BP 120/60   Pulse 93   Ht 5\' 2"  (1.575 m)   Wt 137 lb 9.6 oz (62.4 kg)   SpO2 95%   BMI 25.17 kg/m  Wt Readings from Last 10 Encounters:  05/09/23 137 lb 9.6 oz (62.4 kg)  04/04/23 139 lb (63 kg)  04/02/23 138 lb (62.6 kg)  02/07/23 138 lb 6.4 oz (62.8 kg)  01/24/23 139 lb (63 kg)  01/18/23 138 lb (62.6 kg)  12/26/22 138 lb (62.6 kg)  10/04/22 135 lb 3.2 oz (61.3 kg)  08/18/22 135 lb (61.2 kg)  05/26/22 136 lb 6.4 oz (61.9 kg)   Constitutional: normal weight, in NAD Eyes: no exophthalmos ENT: no thyromegaly, no cervical lymphadenopathy Cardiovascular: Tachycardia, RR, No MRG Respiratory: CTA B Musculoskeletal: no deformities Skin: no rashes Neurological: no tremor with outstretched hands  ASSESSMENT: 1. 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   Glucose     65 - 99 mg/dL 956 (H)  Islet Cell Ab     Neg:<1:1  Negative   ZNT8 Antibodies     <15 U/mL <10   C-Peptide     0.80 - 3.85 ng/mL 1.78   Glutamic Acid Decarb Ab     <5 IU/mL 10 (H)    She does not have low insulin production, but her GAD antibodies are positive.  This qualifies her for LADA.   2.  HL  PLAN:  1. Patient with longstanding, uncontrolled, LADA, on oral antidiabetic regimen with metformin , sulfonylurea and SGLT2 inhibitor added at last visit.  We did not have to add insulin before last visit but HbA1c was higher than, at 9.0%.  Unfortunately, she was off Rybelsus  due to GERD.  She did agree to try either Jardiance  (however, she had yeast infections with this in the past) or Farxiga , whichever covered, at a low dose.  We continued metformin  and Amaryl  but we did discuss about letting me know if the sugars do not improve so that we could start insulin.  We also discussed about the need to improve diet. -At today's visit, sugars appeared to have improved, with still occasional hyperglycemic spikes especially later in the day, but with the majority of the blood sugars in the target range.  We did discuss about trying to try to identify the foods that can raise her blood sugars higher than target of 180, and I also recommended to increase the Farxiga  dose to the target dose of 10 mg before breakfast since she tolerates it well, but for now it does not appear that she needs insulin.  She is very relieved.  We can continue the same regimen for now.  She does inquire about the safety of metformin  and I reassured her that this is an excellent, safe, medication. - I suggested to:  Patient Instructions  Please continue: - Metformin  ER 1000 mg 2x a day - Amaryl  1 mg  before lunch (2 mg while on steroids)  Please increase: - Farxiga  10 mg before b'fast  Please return in 3-4 months with your sugar log.   - we checked her HbA1c: 7.8% (improved) - advised to check sugars at different times of the day - 1x a day, rotating check times - advised for yearly eye exams >> she is UTD - return to clinic in 3-4 months  2. HL - Latest lipid panel reviewed from 03/2023: LDL above target, otherwise fractions at goal: Lab Results  Component Value Date   CHOL 213 (H) 04/04/2023   HDL 51.40 04/04/2023    LDLCALC 138 (H) 04/04/2023   LDLDIRECT 152.7 08/26/2012   TRIG 117.0 04/04/2023   CHOLHDL 4 04/04/2023  - She was previously on Livalo  but stopped due to muscle aches.  She was also on Zetia  but not taken consistently.  At last visit I refilled her prescription and advised her to start. - She is back on Livalo  2 mg daily now, restarted after the above results returned  Emilie Harden, MD PhD [facility]

## 2023-05-09 NOTE — Addendum Note (Signed)
 Addended by: Vernon Goodpasture on: 05/09/2023 08:45 AM   Modules accepted: Orders

## 2023-05-16 DIAGNOSIS — H2513 Age-related nuclear cataract, bilateral: Secondary | ICD-10-CM | POA: Diagnosis not present

## 2023-05-16 DIAGNOSIS — E113591 Type 2 diabetes mellitus with proliferative diabetic retinopathy without macular edema, right eye: Secondary | ICD-10-CM | POA: Diagnosis not present

## 2023-05-16 DIAGNOSIS — E113512 Type 2 diabetes mellitus with proliferative diabetic retinopathy with macular edema, left eye: Secondary | ICD-10-CM | POA: Diagnosis not present

## 2023-05-16 DIAGNOSIS — H31093 Other chorioretinal scars, bilateral: Secondary | ICD-10-CM | POA: Diagnosis not present

## 2023-05-16 DIAGNOSIS — H35372 Puckering of macula, left eye: Secondary | ICD-10-CM | POA: Diagnosis not present

## 2023-05-16 DIAGNOSIS — H04123 Dry eye syndrome of bilateral lacrimal glands: Secondary | ICD-10-CM | POA: Diagnosis not present

## 2023-05-29 DIAGNOSIS — N3020 Other chronic cystitis without hematuria: Secondary | ICD-10-CM | POA: Diagnosis not present

## 2023-05-29 DIAGNOSIS — N952 Postmenopausal atrophic vaginitis: Secondary | ICD-10-CM | POA: Diagnosis not present

## 2023-05-29 DIAGNOSIS — N8111 Cystocele, midline: Secondary | ICD-10-CM | POA: Diagnosis not present

## 2023-06-10 ENCOUNTER — Other Ambulatory Visit: Payer: Self-pay | Admitting: Internal Medicine

## 2023-06-13 ENCOUNTER — Ambulatory Visit (INDEPENDENT_AMBULATORY_CARE_PROVIDER_SITE_OTHER): Admitting: Internal Medicine

## 2023-06-13 ENCOUNTER — Encounter: Payer: Self-pay | Admitting: Internal Medicine

## 2023-06-13 VITALS — BP 122/80 | HR 102 | Temp 98.3°F | Ht 62.0 in | Wt 136.0 lb

## 2023-06-13 DIAGNOSIS — J4521 Mild intermittent asthma with (acute) exacerbation: Secondary | ICD-10-CM | POA: Diagnosis not present

## 2023-06-13 MED ORDER — PREDNISONE 20 MG PO TABS: 20.0000 mg | ORAL_TABLET | Freq: Every day | ORAL | 0 refills | Status: AC

## 2023-06-13 NOTE — Patient Instructions (Signed)
 We have sent in the prednisone  to take 1 pill daily for 5 days.

## 2023-06-13 NOTE — Assessment & Plan Note (Signed)
 With flare rx prednisone . Likely triggered by viral infection (from grandchild). Taking allegra and benadryl not flonase. Asked to add flonase to help. If no improvement in 2-3 days add doxycycline .

## 2023-06-13 NOTE — Progress Notes (Signed)
   Subjective:   Patient ID: Lindsay Henderson, female    DOB: [birth year], 68 y.o.   MRN: [id number]  HPI The patient is a 68 YO female coming in for 2 weeks of allergy symptoms clear drainage and cough. Overall not improving or worsening. Some wheezing at home. Has inhalers and using sometimes.   Review of Systems  Constitutional:  Negative for activity change, appetite change, chills, fatigue, fever and unexpected weight change.  HENT:  Positive for congestion, postnasal drip, rhinorrhea and sinus pressure. Negative for ear discharge, ear pain, sinus pain, sneezing, sore throat, tinnitus, trouble swallowing and voice change.   Eyes: Negative.   Respiratory:  Positive for cough and wheezing. Negative for chest tightness and shortness of breath.   Cardiovascular: Negative.   Gastrointestinal: Negative.   Musculoskeletal:  Negative for myalgias.  Neurological: Negative.     Objective:  Physical Exam Constitutional:      Appearance: She is well-developed.  HENT:     Head: Normocephalic and atraumatic.  Cardiovascular:     Rate and Rhythm: Normal rate and regular rhythm.  Pulmonary:     Effort: Pulmonary effort is normal. No respiratory distress.     Breath sounds: Wheezing present. No rales.  Abdominal:     General: Bowel sounds are normal. There is no distension.     Palpations: Abdomen is soft.     Tenderness: There is no abdominal tenderness. There is no rebound.  Musculoskeletal:     Cervical back: Normal range of motion.  Skin:    General: Skin is warm and dry.  Neurological:     Mental Status: She is alert and oriented to person, place, and time.     Coordination: Coordination normal.     Vitals:   06/13/23 0937  BP: 122/80  Pulse: (!) 102  Temp: 98.3 F (36.8 C)  TempSrc: Oral  SpO2: 95%  Weight: 136 lb (61.7 kg)  Height: 5\' 2"  (1.575 m)    Assessment & Plan:

## 2023-07-04 DIAGNOSIS — E113512 Type 2 diabetes mellitus with proliferative diabetic retinopathy with macular edema, left eye: Secondary | ICD-10-CM | POA: Diagnosis not present

## 2023-07-04 DIAGNOSIS — E113591 Type 2 diabetes mellitus with proliferative diabetic retinopathy without macular edema, right eye: Secondary | ICD-10-CM | POA: Diagnosis not present

## 2023-07-04 DIAGNOSIS — H04123 Dry eye syndrome of bilateral lacrimal glands: Secondary | ICD-10-CM | POA: Diagnosis not present

## 2023-07-04 DIAGNOSIS — H35372 Puckering of macula, left eye: Secondary | ICD-10-CM | POA: Diagnosis not present

## 2023-07-04 DIAGNOSIS — H31093 Other chorioretinal scars, bilateral: Secondary | ICD-10-CM | POA: Diagnosis not present

## 2023-07-04 DIAGNOSIS — H2513 Age-related nuclear cataract, bilateral: Secondary | ICD-10-CM | POA: Diagnosis not present

## 2023-08-01 ENCOUNTER — Ambulatory Visit (INDEPENDENT_AMBULATORY_CARE_PROVIDER_SITE_OTHER)

## 2023-08-01 ENCOUNTER — Ambulatory Visit: Admitting: Family Medicine

## 2023-08-01 ENCOUNTER — Ambulatory Visit: Payer: Self-pay | Admitting: Family Medicine

## 2023-08-01 ENCOUNTER — Encounter: Payer: Self-pay | Admitting: Family Medicine

## 2023-08-01 VITALS — BP 126/70 | HR 85 | Temp 97.7°F | Ht 62.0 in | Wt 137.0 lb

## 2023-08-01 DIAGNOSIS — M25532 Pain in left wrist: Secondary | ICD-10-CM

## 2023-08-01 DIAGNOSIS — S6992XA Unspecified injury of left wrist, hand and finger(s), initial encounter: Secondary | ICD-10-CM

## 2023-08-01 DIAGNOSIS — M25432 Effusion, left wrist: Secondary | ICD-10-CM

## 2023-08-01 DIAGNOSIS — M1812 Unilateral primary osteoarthritis of first carpometacarpal joint, left hand: Secondary | ICD-10-CM | POA: Diagnosis not present

## 2023-08-01 DIAGNOSIS — M7989 Other specified soft tissue disorders: Secondary | ICD-10-CM | POA: Diagnosis not present

## 2023-08-01 DIAGNOSIS — M25542 Pain in joints of left hand: Secondary | ICD-10-CM | POA: Diagnosis not present

## 2023-08-01 NOTE — Progress Notes (Signed)
 Subjective:     Patient ID: Lindsay Henderson, female    DOB: Jan 20, 1955, 68 y.o.   MRN: [id number]  Chief Complaint  Patient presents with   Wrist Pain    Left wrist pain radiating to thumb since last Friday. Recently went on a trip and was lifting luggage. Was swollen but not so bad now but pain is bothering her when she is having to move her grandbaby around.  Taking ibuprofen and tylenol but still having pain.     Wrist Pain  Pertinent negatives include no fever or tingling.     History of Present Illness         C/o left wrist pain x 1 wk. States she was lifting something heavy prior to the pain.   She has been taking ibuprofen and tylenol.  Using Voltaren gel.  Pain seems to be worsening.  Denies numbness or tingling.  Denies history of osteoporosis.    Health Maintenance Due  Topic Date Due   Hepatitis C Screening  Never done   DTaP/Tdap/Td (1 - Tdap) Never done   Pneumococcal Vaccine: 50+ Years (1 of 2 - PCV) Never done   Zoster Vaccines- Shingrix (1 of 2) Never done   COVID-19 Vaccine (3 - 2024-25 season) 09/10/2022   OPHTHALMOLOGY EXAM  05/10/2023    Past Medical History:  Diagnosis Date   Adenomatous colon polyp 11/2005   Allergic rhinitis    Allergy    Anomalous atrioventricular excitation    Anxiety    no per pt   Asthma    Diverticulosis of colon    Gastritis    GERD (gastroesophageal reflux disease)    History of supraventricular tachycardia    Hypercholesteremia    Kidney stones    Nephrolithiasis    Renal cyst    Somatic dysfunction    Type II or unspecified type diabetes mellitus without mention of complication, not stated as uncontrolled    Vitamin D  deficiency     Past Surgical History:  Procedure Laterality Date   CARDIAC ELECTROPHYSIOLOGY MAPPING AND ABLATION  2004   COLONOSCOPY  2007   TUBAL LIGATION      Family History  Problem Relation Age of Onset   Diabetes Father    Hypertension Father    Hyperlipidemia Father     Breast cancer Mother    Esophageal cancer Neg Hx    Stomach cancer Neg Hx    Colon cancer Neg Hx    Rectal cancer Neg Hx     Social History   Socioeconomic History   Marital status: Married    Spouse name: Not on file   Number of children: 4   Years of education: Not on file   Highest education level: Not on file  Occupational History   Occupation: [employer]  Tobacco Use   Smoking status: Never    Passive exposure: Never   Smokeless tobacco: Never  Substance and Sexual Activity   Alcohol use: No   Drug use: No   Sexual activity: Not Currently  Other Topics Concern   Not on file  Social History Narrative   Not on file   Social Drivers of Health   Financial Resource Strain: Low Risk  (04/02/2023)   Overall Financial Resource Strain (CARDIA)    Difficulty of Paying Living Expenses: Not hard at all  Food Insecurity: No Food Insecurity (04/02/2023)   Hunger Vital Sign    Worried About Running Out of Food in the Last Year: Never  true    Ran Out of Food in the Last Year: Never true  Transportation Needs: No Transportation Needs (04/02/2023)   PRAPARE - Administrator, Civil Service (Medical): No    Lack of Transportation (Non-Medical): No  Physical Activity: Insufficiently Active (04/02/2023)   Exercise Vital Sign    Days of Exercise per Week: 3 days    Minutes of Exercise per Session: 30 min  Stress: No Stress Concern Present (04/02/2023)   [employer] of Occupational Health - Occupational Stress Questionnaire    Feeling of Stress : Not at all  Social Connections: Socially Integrated (04/02/2023)   Social Connection and Isolation Panel    Frequency of Communication with Friends and Family: More than three times a week    Frequency of Social Gatherings with Friends and Family: More than three times a week    Attends Religious Services: More than 4 times per year    Active Member of [employer] or Organizations: Yes    Attends Engineer, structural:  More than 4 times per year    Marital Status: Married  Catering manager Violence: Not At Risk (04/02/2023)   Humiliation, Afraid, Rape, and Kick questionnaire    Fear of Current or Ex-Partner: No    Emotionally Abused: No    Physically Abused: No    Sexually Abused: No    Outpatient Medications Prior to Visit  Medication Sig Dispense Refill   albuterol  (VENTOLIN  HFA) 108 (90 Base) MCG/ACT inhaler Inhale 2 puffs into the lungs every 6 (six) hours as needed for wheezing or shortness of breath. 8 g 3   budesonide -formoterol  (SYMBICORT ) 80-4.5 MCG/ACT inhaler Inhale 2 puffs into the lungs 2 (two) times daily. 1 each 3   clotrimazole -betamethasone  (LOTRISONE ) cream Apply 1 application topically 2 (two) times daily. 45 g 2   cyclobenzaprine  (FLEXERIL ) 10 MG tablet Take 1 tablet (10 mg total) by mouth 3 (three) times daily as needed for muscle spasms. 45 tablet 0   dapagliflozin  propanediol (FARXIGA ) 10 MG TABS tablet Take 1 tablet (10 mg total) by mouth daily before breakfast. 90 tablet 3   esomeprazole  (NEXIUM ) 40 MG capsule Take 1 capsule (40 mg total) by mouth daily. 90 capsule 3   estradiol  (ESTRACE  VAGINAL) 0.1 MG/GM vaginal cream Use nightly as directed x 2 weeks; then use 2 x per week for maintenance 42.5 g 11   ezetimibe  (ZETIA ) 10 MG tablet Take 1 tablet (10 mg total) by mouth daily. 90 tablet 3   fexofenadine ([name]) 180 MG tablet Take 180 mg by mouth daily.     fluticasone  (FLONASE) 50 MCG/ACT nasal spray Place 2 sprays into both nostrils daily.     glimepiride  (AMARYL ) 1 MG tablet Take 1 tablet by mouth once daily with breakfast 90 tablet 0   metFORMIN  (GLUCOPHAGE -XR) 500 MG 24 hr tablet TAKE 4 TABLETS BY MOUTH ONCE DAILY WITH BREAKFAST 360 tablet 3   Pitavastatin  Calcium  2 MG TABS Take 1 tablet (2 mg total) by mouth daily. 90 tablet 3   tobramycin (TOBREX) 0.3 % ophthalmic solution 1 drop 4 (four) times daily.     No facility-administered medications prior to visit.    Allergies   Allergen Reactions   Parlodel  [Bromocriptine  Mesylate] Nausea Only    nausea   Peanut-Containing Drug Products Hives, Itching and Swelling   Penicillins     REACTION: rash and sob   Pioglitazone  Diarrhea    tremor   Sulfa Antibiotics Hives   Zithromax  [Azithromycin ] Nausea  And Vomiting    Review of Systems  Constitutional:  Negative for chills and fever.  Respiratory:  Negative for shortness of breath.   Cardiovascular:  Negative for chest pain, palpitations and leg swelling.  Gastrointestinal:  Negative for abdominal pain, constipation, diarrhea, nausea and vomiting.  Genitourinary:  Negative for dysuria, frequency and urgency.  Musculoskeletal:  Positive for joint pain. Negative for falls.       Left wrist and thumb joint pain  Neurological:  Negative for dizziness, tingling and focal weakness.       Objective:    Physical Exam Constitutional:      General: She is not in acute distress.    Appearance: She is not ill-appearing.  HENT:     Mouth/Throat:     Mouth: Mucous membranes are moist.     Pharynx: Oropharynx is clear.  Eyes:     Extraocular Movements: Extraocular movements intact.     Conjunctiva/sclera: Conjunctivae normal.  Cardiovascular:     Rate and Rhythm: Normal rate.  Pulmonary:     Effort: Pulmonary effort is normal.  Musculoskeletal:     Left elbow: Normal.     Left forearm: Normal.     Left wrist: Swelling, tenderness and bony tenderness present. Decreased range of motion. Normal pulse.     Left hand: Swelling and bony tenderness present. Decreased range of motion. Decreased strength of finger abduction and wrist extension. Normal sensation. Normal capillary refill. Normal pulse.     Cervical back: Normal range of motion and neck supple.     Comments: Tenderness and swelling of left CMC joint as well as distal radius. Limited ROM and decreased strength. LUE is neurovascularly intact   Skin:    General: Skin is warm and dry.     Findings: No  bruising, erythema or rash.  Neurological:     General: No focal deficit present.     Mental Status: She is alert and oriented to person, place, and time.     Sensory: No sensory deficit.     Motor: No weakness.     Coordination: Coordination normal.     Gait: Gait normal.  Psychiatric:        Mood and Affect: Mood normal.        Behavior: Behavior normal.        Thought Content: Thought content normal.      BP 126/70   Pulse 85   Temp 97.7 F (36.5 C) (Temporal)   Ht 5' 2 (1.575 m)   Wt 137 lb (62.1 kg)   SpO2 98%   BMI 25.06 kg/m  Wt Readings from Last 3 Encounters:  08/01/23 137 lb (62.1 kg)  06/13/23 136 lb (61.7 kg)  05/09/23 137 lb 9.6 oz (62.4 kg)       Assessment & Plan:   Problem List Items Addressed This Visit   None Visit Diagnoses       Pain and swelling of left wrist    -  Primary   Relevant Orders   DG Wrist Complete Left   Ambulatory referral to Sports Medicine     Injury of left wrist, initial encounter       Relevant Orders   DG Wrist Complete Left   Ambulatory referral to Sports Medicine      Approximate 1 week history of pain and swelling in her left wrist and thumb joint after lifting heavy luggage.  Probable sprain.  X-ray ordered. Continue with conservative treatment.  Discussed RICE.  She  will get an over-the-counter splint.  Referral to [facility] sports medicine.  I am having Lindsay A. Nazzaro maintain her fluticasone , fexofenadine, clotrimazole -betamethasone , tobramycin, ezetimibe , metFORMIN , budesonide -formoterol , cyclobenzaprine , esomeprazole , estradiol , albuterol , Pitavastatin  Calcium , dapagliflozin  propanediol, and glimepiride .  No orders of the defined types were placed in this encounter.

## 2023-08-01 NOTE — Patient Instructions (Addendum)
 Please go downstairs for an x-ray of your left wrist before you leave.  Continue using Voltaren gel and taking Tylenol and ibuprofen as needed.  Use ice packs and elevate as needed.  Try the wrist splint but be sure to take it off periodically to move your wrist.  I will be in touch with your x-ray results  I am also referring you to sports medicine on the first floor of our building and they will call you to schedule.

## 2023-08-10 NOTE — Progress Notes (Signed)
 Ben Kaesha Kirsch D.[facility] [address] [place] [address] Phone: [phone]   Assessment and Plan:     1. Arthritis of carpometacarpal (CMC) joint of left thumb 2. Left hand pain -Chronic with exacerbation, initial sports medicine visit - Most consistent with flare of left CMC osteoarthritis - Reviewed patient's x-ray in clinic which showed moderate degenerative changes at [facility] joint without acute fracture or dislocation - Start meloxicam  15 mg daily x2 weeks.  If still having pain after 2 weeks, complete 3rd-week of NSAID. May use remaining NSAID as needed once daily for pain control.  Do not to use additional over-the-counter NSAIDs (ibuprofen, naproxen, Advil, Aleve, etc.) while taking prescription NSAIDs.  May use Tylenol [phone] mg 2 to 3 times a day for breakthrough pain. -Recommend continuing to use thumb brace during the day to decrease strain over [facility] joint   Pertinent previous records reviewed include left wrist x-ray 08/01/2023  Patient accompanied by her husband throughout entirety of office visit  Follow Up: 4 weeks for reevaluation.  If no improvement or worsening of symptoms, would consider [facility]   Subjective:   I, Moenique Parris, am serving as a Neurosurgeon for Doctor Morene Mace  Chief Complaint: left wrist   HPI:   08/13/2023 Patient is a 68 year old female with left wrist pain. Patient states left wrist 2 weeks ago. Recently went on a trip and was lifting luggage. Has been using Voltaren, ibu, and tylenol. She notes her swelling that has decreased. Does endorse tingling. Decreased ROM . Has pain when holding things.     Relevant Historical Information: GERD, DM type II  Additional pertinent review of systems negative.   Current Outpatient Medications:    meloxicam  (MOBIC ) 15 MG tablet, Take 1 tablet (15 mg total) by mouth daily., Disp: 30 tablet, Rfl: 0   albuterol  (VENTOLIN  HFA) 108 (90 Base) MCG/ACT inhaler,  Inhale 2 puffs into the lungs every 6 (six) hours as needed for wheezing or shortness of breath., Disp: 8 g, Rfl: 3   budesonide -formoterol  (SYMBICORT ) 80-4.5 MCG/ACT inhaler, Inhale 2 puffs into the lungs 2 (two) times daily., Disp: 1 each, Rfl: 3   clotrimazole -betamethasone  (LOTRISONE ) cream, Apply 1 application topically 2 (two) times daily., Disp: 45 g, Rfl: 2   cyclobenzaprine  (FLEXERIL ) 10 MG tablet, Take 1 tablet (10 mg total) by mouth 3 (three) times daily as needed for muscle spasms., Disp: 45 tablet, Rfl: 0   dapagliflozin  propanediol (FARXIGA ) 10 MG TABS tablet, Take 1 tablet (10 mg total) by mouth daily before breakfast., Disp: 90 tablet, Rfl: 3   esomeprazole  (NEXIUM ) 40 MG capsule, Take 1 capsule (40 mg total) by mouth daily., Disp: 90 capsule, Rfl: 3   estradiol  (ESTRACE  VAGINAL) 0.1 MG/GM vaginal cream, Use nightly as directed x 2 weeks; then use 2 x per week for maintenance, Disp: 42.5 g, Rfl: 11   ezetimibe  (ZETIA ) 10 MG tablet, Take 1 tablet (10 mg total) by mouth daily., Disp: 90 tablet, Rfl: 3   fexofenadine ([name]) 180 MG tablet, Take 180 mg by mouth daily., Disp: , Rfl:    fluticasone  (FLONASE) 50 MCG/ACT nasal spray, Place 2 sprays into both nostrils daily., Disp: , Rfl:    glimepiride  (AMARYL ) 1 MG tablet, Take 1 tablet by mouth once daily with breakfast, Disp: 90 tablet, Rfl: 0   metFORMIN  (GLUCOPHAGE -XR) 500 MG 24 hr tablet, TAKE 4 TABLETS BY MOUTH ONCE DAILY WITH BREAKFAST, Disp: 360 tablet, Rfl: 3  Pitavastatin  Calcium  2 MG TABS, Take 1 tablet (2 mg total) by mouth daily., Disp: 90 tablet, Rfl: 3   tobramycin (TOBREX) 0.3 % ophthalmic solution, 1 drop 4 (four) times daily., Disp: , Rfl:    Objective:     Vitals:   08/13/23 0817  BP: 138/84  Pulse: 78  SpO2: 98%  Weight: 135 lb (61.2 kg)  Height: 5' 2 (1.575 m)      Body mass index is 24.69 kg/m.    Physical Exam:    General: Appears well, nad, nontoxic and pleasant Neuro:sensation intact, strength is  5/5 in upper extremities, muscle tone wnl Skin:no susupicious lesions or rashes  Left hand/Wrist:   No deformity or swelling appreciated. ROM  Ext 90, flexion 70, radial/ulnar deviation 30 TTP CMC Positive grind test at [facility], negative at first MCP nttp over the snuff box, dorsal carpals, volar carpals, radial styloid, ulnar styloid, 1st mcp, tfcc Negative Tinel's, Phalen's, Prayer Tests Negative finklestein Neg tfcc bounce test No pain with resisted ext, flex or deviation    Electronically signed by:  Odis Mace D.[facility] 8:50 AM 08/13/23

## 2023-08-13 ENCOUNTER — Ambulatory Visit: Admitting: Sports Medicine

## 2023-08-13 VITALS — BP 138/84 | HR 78 | Ht 62.0 in | Wt 135.0 lb

## 2023-08-13 DIAGNOSIS — M1812 Unilateral primary osteoarthritis of first carpometacarpal joint, left hand: Secondary | ICD-10-CM | POA: Diagnosis not present

## 2023-08-13 DIAGNOSIS — M79642 Pain in left hand: Secondary | ICD-10-CM | POA: Diagnosis not present

## 2023-08-13 MED ORDER — MELOXICAM 15 MG PO TABS: 15.0000 mg | ORAL_TABLET | Freq: Every day | ORAL | 0 refills | Status: DC

## 2023-08-13 NOTE — Patient Instructions (Signed)
-   Start meloxicam  15 mg daily x2 weeks.  If still having pain after 2 weeks, complete 3rd-week of NSAID. May use remaining NSAID as needed once daily for pain control.  Do not to use additional over-the-counter NSAIDs (ibuprofen, naproxen, Advil, Aleve, etc.) while taking prescription NSAIDs.  May use Tylenol [phone] mg 2 to 3 times a day for breakthrough pain.  Continue using thumb brace   4 week follow up

## 2023-08-22 DIAGNOSIS — E113512 Type 2 diabetes mellitus with proliferative diabetic retinopathy with macular edema, left eye: Secondary | ICD-10-CM | POA: Diagnosis not present

## 2023-08-22 DIAGNOSIS — H2513 Age-related nuclear cataract, bilateral: Secondary | ICD-10-CM | POA: Diagnosis not present

## 2023-08-22 DIAGNOSIS — H31093 Other chorioretinal scars, bilateral: Secondary | ICD-10-CM | POA: Diagnosis not present

## 2023-08-22 DIAGNOSIS — E113591 Type 2 diabetes mellitus with proliferative diabetic retinopathy without macular edema, right eye: Secondary | ICD-10-CM | POA: Diagnosis not present

## 2023-08-22 DIAGNOSIS — H04123 Dry eye syndrome of bilateral lacrimal glands: Secondary | ICD-10-CM | POA: Diagnosis not present

## 2023-08-22 DIAGNOSIS — H35372 Puckering of macula, left eye: Secondary | ICD-10-CM | POA: Diagnosis not present

## 2023-09-05 ENCOUNTER — Ambulatory Visit: Admitting: Internal Medicine

## 2023-09-05 ENCOUNTER — Encounter: Payer: Self-pay | Admitting: Internal Medicine

## 2023-09-05 VITALS — BP 120/60 | HR 94 | Ht 62.0 in | Wt 138.2 lb

## 2023-09-05 DIAGNOSIS — E1369 Other specified diabetes mellitus with other specified complication: Secondary | ICD-10-CM

## 2023-09-05 DIAGNOSIS — Z7984 Long term (current) use of oral hypoglycemic drugs: Secondary | ICD-10-CM | POA: Diagnosis not present

## 2023-09-05 DIAGNOSIS — E785 Hyperlipidemia, unspecified: Secondary | ICD-10-CM | POA: Diagnosis not present

## 2023-09-05 DIAGNOSIS — E13319 Other specified diabetes mellitus with unspecified diabetic retinopathy without macular edema: Secondary | ICD-10-CM | POA: Diagnosis not present

## 2023-09-05 DIAGNOSIS — E133593 Other specified diabetes mellitus with proliferative diabetic retinopathy without macular edema, bilateral: Secondary | ICD-10-CM

## 2023-09-05 DIAGNOSIS — E113593 Type 2 diabetes mellitus with proliferative diabetic retinopathy without macular edema, bilateral: Secondary | ICD-10-CM

## 2023-09-05 DIAGNOSIS — E1169 Type 2 diabetes mellitus with other specified complication: Secondary | ICD-10-CM

## 2023-09-05 LAB — POCT GLYCOSYLATED HEMOGLOBIN (HGB A1C): Hemoglobin A1C: 7.8 % — AB (ref 4.0–5.6)

## 2023-09-05 MED ORDER — METFORMIN HCL ER 500 MG PO TB24: ORAL_TABLET | ORAL | 3 refills | Status: AC

## 2023-09-05 MED ORDER — GLIMEPIRIDE 1 MG PO TABS: 1.0000 mg | ORAL_TABLET | Freq: Every day | ORAL | 3 refills | Status: AC

## 2023-09-05 NOTE — Progress Notes (Signed)
 Patient ID: Lindsay Henderson, female   DOB: 1955/04/12, 68 y.o.   MRN: [id number]  HPI: Lindsay Henderson is a 68 y.o.-year-old female, returning for follow-up for LADA , dx 07/2021, prev. DM2, dx in 2000 (had gestational diabetes in 79 in 1992), non-insulin-dependent, uncontrolled, with complications (DR, macular edema). Pt. previously saw Dr. Kassie, but last visit with me 4  months ago.  Interim history: No increased urination, nausea, chest pain.  She has blurry vision for which she gets intraocular injections.  She has follow-up with her retina specialist in 2 weeks.  Reviewed HbA1c: Lab Results  Component Value Date   HGBA1C 7.8 (A) 05/09/2023   HGBA1C 9.0 (A) 02/07/2023   HGBA1C 8.2 (A) 10/04/2022   HGBA1C 8.0 (A) 05/26/2022   HGBA1C 8.6 (H) 03/01/2022   HGBA1C 7.9 (A) 11/08/2021   HGBA1C 8.7 (A) 07/28/2021   HGBA1C 8.4 (A) 05/12/2021   HGBA1C 8.5 (A) 03/03/2021   HGBA1C 8.7 (H) 02/11/2021   Pt is on a regimen of: - Metformin  ER 1000 mg 2x a day - Amaryl  1-2 mg with breakfast >> before lunch (higher dose with steroids) >> actually taking it after breakfast - Farxiga  5 >> 10 mg before breakfast -she forgot about the increase in dose and only started this a month ago.  She is taking this in the afternoon. She previously had vaginitis with Jardiance , despite significant improvement in her blood sugars while on it. Invokana  was not affordable. She was previously on repaglinide  before changing to glimepiride . She tried bromocriptine  and Rybelsus  and they caused occasional gas pains.  She tried again Rybelsus  in 2023 but developed increased GERD and had to stop. She had diarrhea and tremors with Actos . She refused insulin.  Pt checks her sugars 1-3x a day and they are: - am: 98, 131-166, 176 >> 147-173, 227 >> 117-139, 182 >> 115-134 - 2h after b'fast: 151, 167 >> n/c >> 117-163 >> n/c >> 126-148 >> 119-134 - before lunch:  140-150 >> 93 >> 175-180 >> 86, 146, 186 >> 141 -  2h after lunch: 150-170 >> 175-180 >> 121, 130 >> n/c  - before dinner: 150-170 >> 108 >> 165 >> 117, 124, 238 >> 116, 255 - 2h after dinner: 170-180 >> 160s >> 150-175 >> 175-180 >> n/c >> 215 - bedtime:  141  >> 180-200 >> 195 >> 120-168, 186 >> 220 >> n/c - nighttime: n/c Lowest sugar was 79 >> 86 >> 115; she has hypoglycemia awareness at 70.  Highest sugar was 400s (steroids) >> 238 >> 255.  Glucometer: ReliOn  - no CKD, last BUN/creatinine:  Lab Results  Component Value Date   BUN 14 04/04/2023   BUN 15 03/01/2022   CREATININE 0.68 04/04/2023   CREATININE 0.73 03/01/2022   Lab Results  Component Value Date   MICRALBCREAT 21.0 04/04/2023   MICRALBCREAT 10.3 03/13/2013   MICRALBCREAT 5.0 06/17/2008  She is not on ACE inhibitor/ARB  -+ HL; last set of lipids: Lab Results  Component Value Date   CHOL 213 (H) 04/04/2023   HDL 51.40 04/04/2023   LDLCALC 138 (H) 04/04/2023   LDLDIRECT 152.7 08/26/2012   TRIG 117.0 04/04/2023   CHOLHDL 4 04/04/2023  She is on Livalo  2 mg occasionally due the muscle aches. Prev. On Crestor  >> mm aches.  She restarted Zetia  10 mg daily 07/2021 - inconsistently.  - last eye exam was in 04/2023. Improving + PDR B, + macular edema. Dr. Jarold - IO injections in OS.  -  no numbness and tingling in her feet.  Last foot exam 04/04/2023.  Patient also has a history of GERD, renal stones, asthma, history of SVT, vitamin D  deficiency, anxiety.  ROS: + see HPI  Past Medical History:  Diagnosis Date   Adenomatous colon polyp 11/2005   Allergic rhinitis    Allergy    Anomalous atrioventricular excitation    Anxiety    no per pt   Asthma    Diverticulosis of colon    Gastritis    GERD (gastroesophageal reflux disease)    History of supraventricular tachycardia    Hypercholesteremia    Kidney stones    Nephrolithiasis    Renal cyst    Somatic dysfunction    Type II or unspecified type diabetes mellitus without mention of complication, not  stated as uncontrolled    Vitamin D  deficiency    Past Surgical History:  Procedure Laterality Date   CARDIAC ELECTROPHYSIOLOGY MAPPING AND ABLATION  2004   COLONOSCOPY  2007   TUBAL LIGATION     Social History   Socioeconomic History   Marital status: Married    Spouse name: Not on file   Number of children: 4   Years of education: Not on file   Highest education level: Not on file  Occupational History   Occupation: [employer]  Tobacco Use   Smoking status: Never    Passive exposure: Never   Smokeless tobacco: Never  Substance and Sexual Activity   Alcohol use: No   Drug use: No   Sexual activity: Not Currently  Other Topics Concern   Not on file  Social History Narrative   Not on file   Social Drivers of Health   Financial Resource Strain: Low Risk  (04/02/2023)   Overall Financial Resource Strain (CARDIA)    Difficulty of Paying Living Expenses: Not hard at all  Food Insecurity: No Food Insecurity (04/02/2023)   Hunger Vital Sign    Worried About Running Out of Food in the Last Year: Never true    Ran Out of Food in the Last Year: Never true  Transportation Needs: No Transportation Needs (04/02/2023)   PRAPARE - Administrator, Civil Service (Medical): No    Lack of Transportation (Non-Medical): No  Physical Activity: Insufficiently Active (04/02/2023)   Exercise Vital Sign    Days of Exercise per Week: 3 days    Minutes of Exercise per Session: 30 min  Stress: No Stress Concern Present (04/02/2023)   [employer] of Occupational Health - Occupational Stress Questionnaire    Feeling of Stress : Not at all  Social Connections: Socially Integrated (04/02/2023)   Social Connection and Isolation Panel    Frequency of Communication with Friends and Family: More than three times a week    Frequency of Social Gatherings with Friends and Family: More than three times a week    Attends Religious Services: More than 4 times per year    Active Member  of [employer] or Organizations: Yes    Attends Engineer, structural: More than 4 times per year    Marital Status: Married  Catering manager Violence: Not At Risk (04/02/2023)   Humiliation, Afraid, Rape, and Kick questionnaire    Fear of Current or Ex-Partner: No    Emotionally Abused: No    Physically Abused: No    Sexually Abused: No   Current Outpatient Medications on File Prior to Visit  Medication Sig Dispense Refill   albuterol  (VENTOLIN  HFA) 108 (  90 Base) MCG/ACT inhaler Inhale 2 puffs into the lungs every 6 (six) hours as needed for wheezing or shortness of breath. 8 g 3   budesonide -formoterol  (SYMBICORT ) 80-4.5 MCG/ACT inhaler Inhale 2 puffs into the lungs 2 (two) times daily. 1 each 3   clotrimazole -betamethasone  (LOTRISONE ) cream Apply 1 application topically 2 (two) times daily. 45 g 2   cyclobenzaprine  (FLEXERIL ) 10 MG tablet Take 1 tablet (10 mg total) by mouth 3 (three) times daily as needed for muscle spasms. 45 tablet 0   dapagliflozin  propanediol (FARXIGA ) 10 MG TABS tablet Take 1 tablet (10 mg total) by mouth daily before breakfast. 90 tablet 3   esomeprazole  (NEXIUM ) 40 MG capsule Take 1 capsule (40 mg total) by mouth daily. 90 capsule 3   estradiol  (ESTRACE  VAGINAL) 0.1 MG/GM vaginal cream Use nightly as directed x 2 weeks; then use 2 x per week for maintenance 42.5 g 11   ezetimibe  (ZETIA ) 10 MG tablet Take 1 tablet (10 mg total) by mouth daily. 90 tablet 3   fexofenadine ([name]) 180 MG tablet Take 180 mg by mouth daily.     fluticasone  (FLONASE) 50 MCG/ACT nasal spray Place 2 sprays into both nostrils daily.     glimepiride  (AMARYL ) 1 MG tablet Take 1 tablet by mouth once daily with breakfast 90 tablet 0   meloxicam  (MOBIC ) 15 MG tablet Take 1 tablet (15 mg total) by mouth daily. 30 tablet 0   metFORMIN  (GLUCOPHAGE -XR) 500 MG 24 hr tablet TAKE 4 TABLETS BY MOUTH ONCE DAILY WITH BREAKFAST 360 tablet 3   Pitavastatin  Calcium  2 MG TABS Take 1 tablet (2 mg total) by  mouth daily. 90 tablet 3   tobramycin (TOBREX) 0.3 % ophthalmic solution 1 drop 4 (four) times daily.     No current facility-administered medications on file prior to visit.   Allergies  Allergen Reactions   Parlodel  [Bromocriptine  Mesylate] Nausea Only    nausea   Peanut-Containing Drug Products Hives, Itching and Swelling   Penicillins     REACTION: rash and sob   Pioglitazone  Diarrhea    tremor   Sulfa Antibiotics Hives   Zithromax  [Azithromycin ] Nausea And Vomiting   Family History  Problem Relation Age of Onset   Diabetes Father    Hypertension Father    Hyperlipidemia Father    Breast cancer Mother    Esophageal cancer Neg Hx    Stomach cancer Neg Hx    Colon cancer Neg Hx    Rectal cancer Neg Hx    PE: BP 120/60   Pulse 94   Ht 5' 2 (1.575 m)   Wt 138 lb 3.2 oz (62.7 kg)   SpO2 93%   BMI 25.28 kg/m  Wt Readings from Last 10 Encounters:  09/05/23 138 lb 3.2 oz (62.7 kg)  08/13/23 135 lb (61.2 kg)  08/01/23 137 lb (62.1 kg)  06/13/23 136 lb (61.7 kg)  05/09/23 137 lb 9.6 oz (62.4 kg)  04/04/23 139 lb (63 kg)  04/02/23 138 lb (62.6 kg)  02/07/23 138 lb 6.4 oz (62.8 kg)  01/24/23 139 lb (63 kg)  01/18/23 138 lb (62.6 kg)   Constitutional: normal weight, in NAD Eyes: no exophthalmos ENT: no thyromegaly, no cervical lymphadenopathy Cardiovascular: RRR, No MRG Respiratory: CTA B Musculoskeletal: no deformities Skin: no rashes Neurological: no tremor with outstretched hands  ASSESSMENT: 1. 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   Glucose     65 - 99 mg/dL 834 (H)   Islet Cell Ab     Neg:<1:1  Negative   ZNT8 Antibodies     <15 U/mL <10   C-Peptide     0.80 - 3.85 ng/mL 1.78   Glutamic Acid Decarb Ab     <5 IU/mL 10 (H)    She does not  have low insulin production, but her GAD antibodies are positive.  This qualifies her for LADA.   2. HL  PLAN:  1. Patient with longstanding, uncontrolled, LADA, on oral antidiabetic regimen with metformin , sulfonylurea, and SGLT2 inhibitor, with the Farxiga  dose increased at last visit.  Of note, she had yeast infections with Jardiance  in the past but not recently with Farxiga .  At last visit, sugars were improved, with only occasional hyperglycemic spikes especially later in the day but with the majority of the blood sugars within the target range.  We discussed about trying to identify foods that could raise her blood sugars and change the amount of these foods or eliminate them.  At last visit and again today she inquired about the safety of metformin  and I reassured her that this is an excellent, safe, medication. -At today's visit, she tells me that she had forgotten about increasing the dose of Farxiga  and she only started this approximately a month ago.  However, she is taking this in the afternoon, not before breakfast as suggested at last visit.  I advised her to move the dose in the morning.  She is also taking the Amaryl  after breakfast, rather than before lunchtime as advised.  I also advised her to move this before breakfast.  Will continue metformin  at the same dose for now.  We discussed about taking some more sugars in the afternoon.  We may need to move the Amaryl  or Farxiga  later in the day, but it is difficult to decide for this without more afternoon/evening values. - I suggested to:  Patient Instructions  Please continue: - Metformin  ER 1000 mg 2x a day - Farxiga  10 mg before b'fast  Change: - Amaryl  1 mg  before b'fast (2 mg while on steroids)  Please return in 3-4 months with your sugar log.   - we checked her HbA1c: 7.8% (stable) - advised to check sugars at different times of the day - 1x a day, rotating check times - advised for yearly eye exams >> she is UTD - return  to clinic in 3-4 months  2. HL - Latest lipid panel reviewed from 03/2023: LDL above target, otherwise fractions at goal: Lab Results  Component Value Date   CHOL 213 (H) 04/04/2023   HDL 51.40 04/04/2023   LDLCALC 138 (H) 04/04/2023   LDLDIRECT 152.7 08/26/2012   TRIG 117.0 04/04/2023   CHOLHDL 4 04/04/2023  - She restarted Livalo , previously stopped due to muscle aches.  Now tolerates the 2 mg daily dose well.  She was not taking Zetia  consistently in the past.  In 01/2023, I refilled her Zetia  prescription and advised her to take it daily.  Lela Fendt, MD PhD [facility] Endocr[facility]

## 2023-09-05 NOTE — Patient Instructions (Addendum)
 Please continue: - Metformin  ER 1000 mg 2x a day - Farxiga  10 mg before b'fast  Change: - Amaryl  1 mg  before b'fast (2 mg while on steroids)  Please return in 3-4 months with your sugar log.

## 2023-09-17 ENCOUNTER — Other Ambulatory Visit: Payer: Self-pay | Admitting: Sports Medicine

## 2023-09-17 ENCOUNTER — Telehealth: Payer: Self-pay | Admitting: Sports Medicine

## 2023-09-17 MED ORDER — MELOXICAM 15 MG PO TABS: 15.0000 mg | ORAL_TABLET | Freq: Every day | ORAL | 0 refills | Status: AC | PRN

## 2023-09-17 MED ORDER — MELOXICAM 15 MG PO TABS: 15.0000 mg | ORAL_TABLET | Freq: Every day | ORAL | 0 refills | Status: DC

## 2023-09-17 NOTE — Telephone Encounter (Signed)
 Patient called requesting a refill on: meloxicam  (MOBIC ) 15 MG tablet  Pharmacy:  [facility]    (She is scheduled to see Dr Leonce on Friday)

## 2023-09-17 NOTE — Addendum Note (Signed)
 Addended by: Amous Crewe on: 09/17/2023 03:11 PM   Modules accepted: Orders

## 2023-09-17 NOTE — Telephone Encounter (Signed)
 Refill sent.

## 2023-09-20 NOTE — Progress Notes (Unsigned)
 Ben Jackson D.[facility] [address] [place] [address] Phone: [phone]   Assessment and Plan:     1. Arthritis of carpometacarpal (CMC) joint of left thumb (Primary) 2. Left hand pain -Chronic with exacerbation, subsequent visit - Overall moderate improvement in flare of left CMC osteoarthritis with meloxicam  course, HEP, rest with thumb brace - Recommend continuing relative rest with thumb brace during times of flare, continued warm hand baths, continue topical Voltaren gel - Use Tylenol 500 to 1000 mg tablets 2-3 times a day for day-to-day pain relief - Use meloxicam  15 mg daily as needed for pain.  Recommend limiting chronic NSAIDs to 1-2 doses per week to prevent long-term side effects.  May call for refill as needed    Pertinent previous records reviewed include none   Follow Up: As needed if no improvement or worsening of symptoms.  Could consider [facility]   Subjective:   I, Aliveah Gallant, am serving as a Neurosurgeon for Doctor Morene Mace   Chief Complaint: left wrist    HPI:    08/13/2023 Patient is a 68 year old female with left wrist pain. Patient states left wrist 2 weeks ago. Recently went on a trip and was lifting luggage. Has been using Voltaren, ibu, and tylenol. She notes her swelling that has decreased. Does endorse tingling. Decreased ROM . Has pain when holding things.     09/21/2023 Patient states that she is doing better. Has aches at night and when playing with grandchildren. Overall better    Relevant Historical Information: GERD, DM type II  Additional pertinent review of systems negative.   Current Outpatient Medications:    albuterol  (VENTOLIN  HFA) 108 (90 Base) MCG/ACT inhaler, Inhale 2 puffs into the lungs every 6 (six) hours as needed for wheezing or shortness of breath., Disp: 8 g, Rfl: 3   budesonide -formoterol  (SYMBICORT ) 80-4.5 MCG/ACT inhaler, Inhale 2 puffs into the lungs 2 (two) times  daily., Disp: 1 each, Rfl: 3   clotrimazole -betamethasone  (LOTRISONE ) cream, Apply 1 application topically 2 (two) times daily., Disp: 45 g, Rfl: 2   cyclobenzaprine  (FLEXERIL ) 10 MG tablet, Take 1 tablet (10 mg total) by mouth 3 (three) times daily as needed for muscle spasms., Disp: 45 tablet, Rfl: 0   dapagliflozin  propanediol (FARXIGA ) 10 MG TABS tablet, Take 1 tablet (10 mg total) by mouth daily before breakfast., Disp: 90 tablet, Rfl: 3   esomeprazole  (NEXIUM ) 40 MG capsule, Take 1 capsule (40 mg total) by mouth daily., Disp: 90 capsule, Rfl: 3   estradiol  (ESTRACE  VAGINAL) 0.1 MG/GM vaginal cream, Use nightly as directed x 2 weeks; then use 2 x per week for maintenance, Disp: 42.5 g, Rfl: 11   ezetimibe  (ZETIA ) 10 MG tablet, Take 1 tablet (10 mg total) by mouth daily., Disp: 90 tablet, Rfl: 3   fexofenadine ([name]) 180 MG tablet, Take 180 mg by mouth daily., Disp: , Rfl:    fluticasone  (FLONASE) 50 MCG/ACT nasal spray, Place 2 sprays into both nostrils daily., Disp: , Rfl:    glimepiride  (AMARYL ) 1 MG tablet, Take 1 tablet (1 mg total) by mouth daily before breakfast., Disp: 90 tablet, Rfl: 3   meloxicam  (MOBIC ) 15 MG tablet, Take 1 tablet (15 mg total) by mouth daily as needed for pain., Disp: 30 tablet, Rfl: 0   metFORMIN  (GLUCOPHAGE -XR) 500 MG 24 hr tablet, TAKE 4 TABLETS BY MOUTH ONCE DAILY WITH BREAKFAST, Disp: 360 tablet, Rfl: 3   Pitavastatin  Calcium  2 MG  TABS, Take 1 tablet (2 mg total) by mouth daily., Disp: 90 tablet, Rfl: 3   tobramycin (TOBREX) 0.3 % ophthalmic solution, 1 drop 4 (four) times daily., Disp: , Rfl:    Objective:     Vitals:   09/21/23 1008  Pulse: 95  SpO2: 99%  Weight: 136 lb (61.7 kg)  Height: 5' 2 (1.575 m)      Body mass index is 24.87 kg/m.    Physical Exam:    General: Appears well, nad, nontoxic and pleasant Neuro:sensation intact, strength is 5/5 in upper extremities, muscle tone wnl Skin:no susupicious lesions or rashes   Left  hand/Wrist:   No deformity or swelling appreciated. ROM  Ext 90, flexion 70, radial/ulnar deviation 30 TTP MILDLY CMC Positive grind test for mild pain at [facility], negative at first MCP nttp over the snuff box, dorsal carpals, volar carpals, radial styloid, ulnar styloid, 1st mcp, tfcc Negative Tinel's, Phalen's, Prayer Tests Negative finklestein Neg tfcc bounce test No pain with resisted ext, flex or deviation     Electronically signed by:  Odis Mace D.[facility] 10:30 AM 09/21/23

## 2023-09-21 ENCOUNTER — Ambulatory Visit (INDEPENDENT_AMBULATORY_CARE_PROVIDER_SITE_OTHER): Admitting: Sports Medicine

## 2023-09-21 VITALS — HR 95 | Ht 62.0 in | Wt 136.0 lb

## 2023-09-21 DIAGNOSIS — M1812 Unilateral primary osteoarthritis of first carpometacarpal joint, left hand: Secondary | ICD-10-CM | POA: Diagnosis not present

## 2023-09-21 DIAGNOSIS — M79642 Pain in left hand: Secondary | ICD-10-CM

## 2023-09-21 NOTE — Patient Instructions (Signed)
-   Use meloxicam  15 mg daily as needed for pain.  Recommend limiting chronic NSAIDs to 1-2 doses per week to prevent long-term side effects.  Tylenol [phone] mg 2-3 times a day for pain relief   Voltaren gel over areas of pain   Warm hand bathes  As needed follow up

## 2023-10-10 DIAGNOSIS — H35372 Puckering of macula, left eye: Secondary | ICD-10-CM | POA: Diagnosis not present

## 2023-10-10 DIAGNOSIS — H2513 Age-related nuclear cataract, bilateral: Secondary | ICD-10-CM | POA: Diagnosis not present

## 2023-10-10 DIAGNOSIS — H31093 Other chorioretinal scars, bilateral: Secondary | ICD-10-CM | POA: Diagnosis not present

## 2023-10-10 DIAGNOSIS — E113512 Type 2 diabetes mellitus with proliferative diabetic retinopathy with macular edema, left eye: Secondary | ICD-10-CM | POA: Diagnosis not present

## 2023-10-10 DIAGNOSIS — E113591 Type 2 diabetes mellitus with proliferative diabetic retinopathy without macular edema, right eye: Secondary | ICD-10-CM | POA: Diagnosis not present

## 2023-10-10 DIAGNOSIS — H04123 Dry eye syndrome of bilateral lacrimal glands: Secondary | ICD-10-CM | POA: Diagnosis not present

## 2023-11-16 ENCOUNTER — Encounter: Payer: Self-pay | Admitting: Internal Medicine

## 2023-11-16 ENCOUNTER — Ambulatory Visit: Admitting: Internal Medicine

## 2023-11-16 VITALS — BP 130/70 | HR 83 | Temp 97.8°F | Ht 62.0 in | Wt 135.4 lb

## 2023-11-16 DIAGNOSIS — N3000 Acute cystitis without hematuria: Secondary | ICD-10-CM | POA: Diagnosis not present

## 2023-11-16 LAB — POC URINALSYSI DIPSTICK (AUTOMATED)
Bilirubin, UA: NEGATIVE
Blood, UA: NEGATIVE
Glucose, UA: POSITIVE — AB
Ketones, UA: NEGATIVE
Leukocytes, UA: NEGATIVE
Nitrite, UA: NEGATIVE
Protein, UA: NEGATIVE
Spec Grav, UA: 1.015 (ref 1.010–1.025)
Urobilinogen, UA: 0.2 U/dL
pH, UA: 6 (ref 5.0–8.0)

## 2023-11-16 MED ORDER — NITROFURANTOIN MONOHYD MACRO 100 MG PO CAPS: 100.0000 mg | ORAL_CAPSULE | Freq: Two times a day (BID) | ORAL | 0 refills | Status: AC

## 2023-11-16 NOTE — Patient Instructions (Signed)
 We will send in macrobid  to take 1 pill twice a day for 5 days.

## 2023-11-16 NOTE — Progress Notes (Signed)
   Subjective:   Patient ID: Lindsay Henderson, female    DOB: 02/17/1955, 68 y.o.   MRN: [id number]  Discussed the use of AI scribe software for clinical note transcription with the patient, who gave verbal consent to proceed.  History of Present Illness Lindsay Henderson is a 68 year old female with a history of urinary tract infections who presents with burning urination.  She has been experiencing burning sensations during urination for the past couple of weeks. She has a history of frequent urinary tract infections but notes that she has not had one in a long time. A cream previously provided by a healthcare provider helped in the past, but it is not alleviating her current symptoms.  She is currently taking Farxiga , which can increase the likelihood of urinary tract infections due to increased sugar excretion in the urine. Her dosage was recently increased to 10 mg. She has a known allergy to penicillins and sulfa drugs, which limits her antibiotic options. She mentions that she has previously been prescribed doxycycline  for other conditions.  Review of Systems  Constitutional: Negative.   Respiratory: Negative.    Cardiovascular: Negative.   Gastrointestinal:  Negative for abdominal distention, abdominal pain, constipation, diarrhea, nausea and vomiting.  Genitourinary:  Positive for dysuria. Negative for frequency and urgency.  Musculoskeletal: Negative.   Skin: Negative.     Objective:  Physical Exam Constitutional:      Appearance: She is well-developed.  HENT:     Head: Normocephalic and atraumatic.  Cardiovascular:     Rate and Rhythm: Normal rate and regular rhythm.  Pulmonary:     Effort: Pulmonary effort is normal. No respiratory distress.     Breath sounds: Normal breath sounds. No wheezing or rales.  Abdominal:     General: Bowel sounds are normal. There is no distension.     Palpations: Abdomen is soft.     Tenderness: There is no abdominal tenderness.   Musculoskeletal:     Cervical back: Normal range of motion.  Skin:    General: Skin is warm and dry.  Neurological:     Mental Status: She is alert and oriented to person, place, and time.     Coordination: Coordination normal.     Vitals:   11/16/23 0921  Pulse: 83  Temp: 97.8 F (36.6 C)  TempSrc: Oral  SpO2: 95%  Weight: 135 lb 6.4 oz (61.4 kg)  Height: 5' 2 (1.575 m)    Assessment and Plan Assessment & Plan Urinary tract infection   Suspected UTI with dysuria, likely due to increased risk from Farxiga . Empirical treatment is warranted. Prescribe nitrofurantoin  100 mg BID for 5 days. Monitor for recurrent UTIs and consider adjusting Farxiga  if that occurs.

## 2023-11-28 DIAGNOSIS — H31093 Other chorioretinal scars, bilateral: Secondary | ICD-10-CM | POA: Diagnosis not present

## 2023-11-28 DIAGNOSIS — H2513 Age-related nuclear cataract, bilateral: Secondary | ICD-10-CM | POA: Diagnosis not present

## 2023-11-28 DIAGNOSIS — H04123 Dry eye syndrome of bilateral lacrimal glands: Secondary | ICD-10-CM | POA: Diagnosis not present

## 2023-11-28 DIAGNOSIS — H35372 Puckering of macula, left eye: Secondary | ICD-10-CM | POA: Diagnosis not present

## 2023-11-28 DIAGNOSIS — E113513 Type 2 diabetes mellitus with proliferative diabetic retinopathy with macular edema, bilateral: Secondary | ICD-10-CM | POA: Diagnosis not present

## 2024-01-11 ENCOUNTER — Encounter: Payer: Self-pay | Admitting: Internal Medicine

## 2024-01-11 ENCOUNTER — Ambulatory Visit: Admitting: Internal Medicine

## 2024-01-11 VITALS — BP 124/60 | HR 99 | Ht 62.0 in | Wt 137.0 lb

## 2024-01-11 DIAGNOSIS — E1369 Other specified diabetes mellitus with other specified complication: Secondary | ICD-10-CM

## 2024-01-11 DIAGNOSIS — E1169 Type 2 diabetes mellitus with other specified complication: Secondary | ICD-10-CM

## 2024-01-11 DIAGNOSIS — E785 Hyperlipidemia, unspecified: Secondary | ICD-10-CM

## 2024-01-11 DIAGNOSIS — E13319 Other specified diabetes mellitus with unspecified diabetic retinopathy without macular edema: Secondary | ICD-10-CM

## 2024-01-11 LAB — POCT GLYCOSYLATED HEMOGLOBIN (HGB A1C): Hemoglobin A1C: 8.1 % — AB (ref 4.0–5.6)

## 2024-01-11 NOTE — Progress Notes (Signed)
 Patient ID: Lindsay Henderson, female   DOB: Jan 30, 1955, 69 y.o.   MRN: [id number]  HPI: Lindsay Henderson is a 69 y.o.-year-old female, returning for follow-up for LADA , dx 07/2021, prev. DM2, dx in 2000 (had gestational diabetes in 62 in 1992), non-insulin-dependent, uncontrolled, with complications (DR, macular edema). Pt. previously saw Dr. Kassie, but last visit with me 4  months ago.  Interim history: No increased urination, nausea, chest pain.  She has blurry vision for which she gets intraocular injections.  Since last visit, she had cystitis with hematuria approximately 2 months ago. She was on ABx and Prednisone  >> resolved. She is preparing to start a religious fast: no bread, beef, pork, sweets, past and sodas. Water only from 7 pm to 7 am.  Reviewed HbA1c: Lab Results  Component Value Date   HGBA1C 7.8 (A) 09/05/2023   HGBA1C 7.8 (A) 05/09/2023   HGBA1C 9.0 (A) 02/07/2023   HGBA1C 8.2 (A) 10/04/2022   HGBA1C 8.0 (A) 05/26/2022   HGBA1C 8.6 (H) 03/01/2022   HGBA1C 7.9 (A) 11/08/2021   HGBA1C 8.7 (A) 07/28/2021   HGBA1C 8.4 (A) 05/12/2021   HGBA1C 8.5 (A) 03/03/2021   Pt is on a regimen of: - Metformin  ER 1000 mg 2x a day - Amaryl  1-2 mg with breakfast >> before lunch >> actually taking it after breakfast >> now before b'fast - Farxiga  5 >> 10 mg before breakfast -she forgot about the increase in dose and only started this a month ago taking this in the afternoon >> advised her to move before breakfast >> still taking it around lunch She previously had vaginitis with Jardiance , despite significant improvement in her blood sugars while on it. Invokana  was not affordable. She was previously on repaglinide  before changing to glimepiride . She tried bromocriptine  and Rybelsus  and they caused occasional gas pains.  She tried again Rybelsus  in 2023 but developed increased GERD and had to stop. She had diarrhea and tremors with Actos . She refused insulin.  Pt checks her  sugars 1-3x a day and they are: - am: 147-173, 227 >> 117-139, 182 >> 115-134 >> 127, 132-193 - 2h after b'fast: n/c >> 126-148 >> 119-134 >> 133-185, 215 - before lunch: 93 >> 175-180 >> 86, 146, 186 >> 141 >> 123  - 2h after lunch: 150-170 >> 175-180 >> 121, 130 >> 166 - before dinner: 165 >> 117, 124, 238 >> 116, 255 - 2h after dinner:  150-175 >> 175-180 >> n/c >> 215, 336 (no meds) - bedtime: 195 >> 120-168, 186 >> 220 >> n/c >> 180 - nighttime: n/c Lowest sugar was 79 >> 86 >> 115 >> 123; she has hypoglycemia awareness at 70.  Highest sugar was 400s (steroids) >> 238 >> 255 >> 336.  Glucometer: ReliOn  - no CKD, last BUN/creatinine:  Lab Results  Component Value Date   BUN 14 04/04/2023   BUN 15 03/01/2022   CREATININE 0.68 04/04/2023   CREATININE 0.73 03/01/2022   Lab Results  Component Value Date   MICRALBCREAT 21.0 04/04/2023   MICRALBCREAT 10.3 03/13/2013   MICRALBCREAT 5.0 06/17/2008  She is not on ACE inhibitor/ARB  -+ HL; last set of lipids: Lab Results  Component Value Date   CHOL 213 (H) 04/04/2023   HDL 51.40 04/04/2023   LDLCALC 138 (H) 04/04/2023   LDLDIRECT 152.7 08/26/2012   TRIG 117.0 04/04/2023   CHOLHDL 4 04/04/2023  She is on Livalo  2 mg occasionally due the muscle aches. Prev. On Crestor  >>  mm aches.  She restarted Zetia  10 mg daily 07/2021 - inconsistently.  - last eye exam was in 04/2023. Improving + PDR B, + macular edema. Dr. Jarold - IO injections in OS.  - no numbness and tingling in her feet.  Last foot exam 04/04/2023.  Patient also has a history of GERD, renal stones, asthma, history of SVT, vitamin D  deficiency, anxiety.  ROS: + see HPI  Past Medical History:  Diagnosis Date   Adenomatous colon polyp 11/2005   Allergic rhinitis    Allergy    Anomalous atrioventricular excitation    Anxiety    no per pt   Asthma    Diverticulosis of colon    Gastritis    GERD (gastroesophageal reflux disease)    History of supraventricular  tachycardia    Hypercholesteremia    Kidney stones    Nephrolithiasis    Renal cyst    Somatic dysfunction    Type II or unspecified type diabetes mellitus without mention of complication, not stated as uncontrolled    Vitamin D  deficiency    Past Surgical History:  Procedure Laterality Date   CARDIAC ELECTROPHYSIOLOGY MAPPING AND ABLATION  2004   COLONOSCOPY  2007   TUBAL LIGATION     Social History   Socioeconomic History   Marital status: Married    Spouse name: Not on file   Number of children: 4   Years of education: Not on file   Highest education level: 12th grade  Occupational History   Occupation: [employer]  Tobacco Use   Smoking status: Never    Passive exposure: Never   Smokeless tobacco: Never  Substance and Sexual Activity   Alcohol use: No   Drug use: No   Sexual activity: Not Currently  Other Topics Concern   Not on file  Social History Narrative   Not on file   Social Drivers of Health   Tobacco Use: Low Risk (11/16/2023)   Patient History    Smoking Tobacco Use: Never    Smokeless Tobacco Use: Never    Passive Exposure: Never  Financial Resource Strain: Low Risk (11/15/2023)   Overall Financial Resource Strain (CARDIA)    Difficulty of Paying Living Expenses: Not hard at all  Food Insecurity: No Food Insecurity (11/15/2023)   Epic    Worried About Radiation Protection Practitioner of Food in the Last Year: Never true    [employer] of Food in the Last Year: Never true  Transportation Needs: No Transportation Needs (11/15/2023)   Epic    Lack of Transportation (Medical): No    Lack of Transportation (Non-Medical): No  Physical Activity: Insufficiently Active (11/15/2023)   Exercise Vital Sign    Days of Exercise per Week: 3 days    Minutes of Exercise per Session: 30 min  Stress: No Stress Concern Present (11/15/2023)   [employer] of Occupational Health - Occupational Stress Questionnaire    Feeling of Stress: Not at all  Social Connections: Socially  Integrated (11/15/2023)   Social Connection and Isolation Panel    Frequency of Communication with Friends and Family: More than three times a week    Frequency of Social Gatherings with Friends and Family: More than three times a week    Attends Religious Services: More than 4 times per year    Active Member of [employer] or Organizations: Yes    Attends Engineer, Structural: More than 4 times per year    Marital Status: Married  Catering Manager Violence: Not  At Risk (04/02/2023)   Humiliation, Afraid, Rape, and Kick questionnaire    Fear of Current or Ex-Partner: No    Emotionally Abused: No    Physically Abused: No    Sexually Abused: No  Depression (PHQ2-9): Low Risk (11/16/2023)   Depression (PHQ2-9)    PHQ-2 Score: 0  Alcohol Screen: Low Risk (04/02/2023)   Alcohol Screen    Last Alcohol Screening Score (AUDIT): 0  Housing: Low Risk (11/15/2023)   Epic    Unable to Pay for Housing in the Last Year: No    Number of Times Moved in the Last Year: 0    Homeless in the Last Year: No  Utilities: Not At Risk (04/02/2023)   AHC Utilities    Threatened with loss of utilities: No  Health Literacy: Adequate Health Literacy (04/02/2023)   B1300 Health Literacy    Frequency of need for help with medical instructions: Never   Current Outpatient Medications on File Prior to Visit  Medication Sig Dispense Refill   albuterol  (VENTOLIN  HFA) 108 (90 Base) MCG/ACT inhaler Inhale 2 puffs into the lungs every 6 (six) hours as needed for wheezing or shortness of breath. 8 g 3   budesonide -formoterol  (SYMBICORT ) 80-4.5 MCG/ACT inhaler Inhale 2 puffs into the lungs 2 (two) times daily. 1 each 3   clotrimazole -betamethasone  (LOTRISONE ) cream Apply 1 application topically 2 (two) times daily. 45 g 2   cyclobenzaprine  (FLEXERIL ) 10 MG tablet Take 1 tablet (10 mg total) by mouth 3 (three) times daily as needed for muscle spasms. 45 tablet 0   dapagliflozin  propanediol (FARXIGA ) 10 MG TABS tablet Take 1  tablet (10 mg total) by mouth daily before breakfast. 90 tablet 3   esomeprazole  (NEXIUM ) 40 MG capsule Take 1 capsule (40 mg total) by mouth daily. 90 capsule 3   estradiol  (ESTRACE  VAGINAL) 0.1 MG/GM vaginal cream Use nightly as directed x 2 weeks; then use 2 x per week for maintenance 42.5 g 11   ezetimibe  (ZETIA ) 10 MG tablet Take 1 tablet (10 mg total) by mouth daily. 90 tablet 3   fexofenadine ([name]) 180 MG tablet Take 180 mg by mouth daily.     fluticasone  (FLONASE) 50 MCG/ACT nasal spray Place 2 sprays into both nostrils daily.     glimepiride  (AMARYL ) 1 MG tablet Take 1 tablet (1 mg total) by mouth daily before breakfast. 90 tablet 3   meloxicam  (MOBIC ) 15 MG tablet Take 1 tablet (15 mg total) by mouth daily as needed for pain. 30 tablet 0   metFORMIN  (GLUCOPHAGE -XR) 500 MG 24 hr tablet TAKE 4 TABLETS BY MOUTH ONCE DAILY WITH BREAKFAST 360 tablet 3   Pitavastatin  Calcium  2 MG TABS Take 1 tablet (2 mg total) by mouth daily. 90 tablet 3   tobramycin (TOBREX) 0.3 % ophthalmic solution 1 drop 4 (four) times daily.     No current facility-administered medications on file prior to visit.   Allergies  Allergen Reactions   Parlodel  [Bromocriptine  Mesylate] Nausea Only    nausea   Peanut-Containing Drug Products Hives, Itching and Swelling   Penicillins     REACTION: rash and sob   Pioglitazone  Diarrhea    tremor   Sulfa Antibiotics Hives   Zithromax  [Azithromycin ] Nausea And Vomiting   Family History  Problem Relation Age of Onset   Diabetes Father    Hypertension Father    Hyperlipidemia Father    Breast cancer Mother    Esophageal cancer Neg Hx    Stomach cancer Neg Hx  Colon cancer Neg Hx    Rectal cancer Neg Hx    PE: BP (!) 140/60   Pulse 99   Ht 5' 2 (1.575 m)   Wt 137 lb (62.1 kg)   SpO2 94%   BMI 25.06 kg/m  Wt Readings from Last 10 Encounters:  01/11/24 137 lb (62.1 kg)  11/16/23 135 lb 6.4 oz (61.4 kg)  09/21/23 136 lb (61.7 kg)  09/05/23 138 lb 3.2  oz (62.7 kg)  08/13/23 135 lb (61.2 kg)  08/01/23 137 lb (62.1 kg)  06/13/23 136 lb (61.7 kg)  05/09/23 137 lb 9.6 oz (62.4 kg)  04/04/23 139 lb (63 kg)  04/02/23 138 lb (62.6 kg)   Constitutional: normal weight, in NAD Eyes: no exophthalmos ENT: no thyromegaly, no cervical lymphadenopathy Cardiovascular: tachycardia, RR, No MRG Respiratory: CTA B Musculoskeletal: no deformities Skin: no rashes Neurological: no tremor with outstretched hands  ASSESSMENT: 1. 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   Glucose     65 - 99 mg/dL 834 (H)   Islet Cell Ab     Neg:<1:1  Negative   ZNT8 Antibodies     <15 U/mL <10   C-Peptide     0.80 - 3.85 ng/mL 1.78   Glutamic Acid Decarb Ab     <5 IU/mL 10 (H)    She does not have low insulin production, but her GAD antibodies are positive.  This qualifies her for LADA.   2. HL  PLAN:  1. Patient with longstanding, uncontrolled, LADA, on oral antidiabetic regimen with metformin , sulfonylurea, and SGLT2 inhibitor. - She currently tolerates Farxiga  well.  She had yeast infections in the past with Jardiance . - At last visit, she forgot about increasing the dose of Farxiga  and she only did this 1 month prior to the appointment but she was taking it in the afternoon, not before breakfast, as suggested at the previous visit.  I advised her to move the dose in the morning.  She was also taking Amaryl  after breakfast rather than before lunchtime, as advised.  We moved this before breakfast.  We did not change the metformin  dose.  I advised her to check some more sugars in the afternoon/evening.  HbA1c at that time was 7.8%, stable. - at today's visit, sugars appear to be higher at almost all times of the day.  She did relax at night since last visit  and still significant Farxiga  in the afternoon before breakfast.  We did discuss about her UTI 2 months ago.  This resolved completely.  I advised her to let me know if she has recurrent UTIs, in which case, we may need to stop the SGLT2 inhibitor. - I suggested to:  Patient Instructions  Please continue: - Metformin  ER 1000 mg 2x a day - Amaryl  1 mg  before breakfast (2 mg while on steroids)  Use: - Farxiga  10 mg before breakfast  Please return in 3 months with your sugar log (before 04/03/2024).   - we checked her HbA1c: 7%  - advised to check sugars at different times of the day - 1x a day, rotating check times - advised for yearly eye exams >> she is UTD - return to clinic in 3 months  2. HL - Latest lipid panel was reviewed from 03/2023: LDL above  target, otherwise fractions at goal: Lab Results  Component Value Date   CHOL 213 (H) 04/04/2023   HDL 51.40 04/04/2023   LDLCALC 138 (H) 04/04/2023   LDLDIRECT 152.7 08/26/2012   TRIG 117.0 04/04/2023   CHOLHDL 4 04/04/2023  - She restarted Livalo , previously stopped due to muscle aches.  She now tolerate the 2 mg daily dose well.  She was taking Zetia  inconsistently in the past and we discussed about trying to take it daily.  Lela Fendt, MD PhD [facility]

## 2024-01-11 NOTE — Patient Instructions (Addendum)
 Please continue: - Metformin  ER 1000 mg 2x a day - Amaryl  1 mg  before breakfast (2 mg while on steroids)  Use: - Farxiga  10 mg before breakfast  Please return in 3 months with your sugar log (before 04/03/2024).

## 2024-01-30 ENCOUNTER — Ambulatory Visit: Admitting: Internal Medicine

## 2024-01-30 ENCOUNTER — Encounter: Payer: Self-pay | Admitting: Internal Medicine

## 2024-01-30 ENCOUNTER — Ambulatory Visit (INDEPENDENT_AMBULATORY_CARE_PROVIDER_SITE_OTHER): Admitting: Internal Medicine

## 2024-01-30 VITALS — BP 130/80 | HR 79 | Temp 98.1°F | Ht 62.0 in | Wt 135.6 lb

## 2024-01-30 DIAGNOSIS — E113593 Type 2 diabetes mellitus with proliferative diabetic retinopathy without macular edema, bilateral: Secondary | ICD-10-CM | POA: Diagnosis not present

## 2024-01-30 DIAGNOSIS — N3000 Acute cystitis without hematuria: Secondary | ICD-10-CM | POA: Diagnosis not present

## 2024-01-30 DIAGNOSIS — Z7984 Long term (current) use of oral hypoglycemic drugs: Secondary | ICD-10-CM | POA: Diagnosis not present

## 2024-01-30 DIAGNOSIS — N200 Calculus of kidney: Secondary | ICD-10-CM

## 2024-01-30 LAB — POC URINALSYSI DIPSTICK (AUTOMATED)
Bilirubin, UA: NEGATIVE
Blood, UA: NEGATIVE
Glucose, UA: POSITIVE — AB
Ketones, UA: NEGATIVE
Leukocytes, UA: NEGATIVE
Nitrite, UA: NEGATIVE
Protein, UA: NEGATIVE
Spec Grav, UA: 1.02
Urobilinogen, UA: 0.2 U/dL
pH, UA: 6

## 2024-01-30 LAB — MICROALBUMIN / CREATININE URINE RATIO
Creatinine,U: 74.8 mg/dL
Microalb Creat Ratio: 12.2 mg/g (ref 0.0–30.0)
Microalb, Ur: 0.9 mg/dL (ref 0.7–1.9)

## 2024-01-30 MED ORDER — NITROFURANTOIN MONOHYD MACRO 100 MG PO CAPS: 100.0000 mg | ORAL_CAPSULE | Freq: Two times a day (BID) | ORAL | 0 refills | Status: AC

## 2024-01-30 MED ORDER — FLUCONAZOLE 150 MG PO TABS: 150.0000 mg | ORAL_TABLET | Freq: Once | ORAL | 0 refills | Status: AC

## 2024-01-30 NOTE — Patient Instructions (Signed)
 We have sent in macrobid  to take 1 pill twice a day for 5 days.  Let us  know if this is not improving and we can order a Ct scan to look for kidney stones.

## 2024-01-30 NOTE — Progress Notes (Signed)
 "  Subjective:   Patient ID: Lindsay Henderson, female    DOB: Jun 16, 1955, 69 y.o.   MRN: [id number]  Discussed the use of AI scribe software for clinical note transcription with the patient, who gave verbal consent to proceed. History of Present Illness Lindsay Henderson is a 69 year old female with a history of kidney stones who presents with side pain and burning urination.  She has been experiencing side pain and burning urination for about a week, initially attributing these symptoms to a urinary tract infection. The pain is intermittent, with periods of relief followed by recurrence. She also noticed hematuria, a symptom she has experienced in the past with kidney stones.  She has a history of kidney stones, with the last occurrence being several years ago. She is concerned about the possibility of a stone trying to pass.  She is under the care of a doctor for a collapsed bladder and takes Farxiga , which can increase urination frequency and sugar in the urine, potentially leading to urinary tract infections. She reports frequent urination and glucosuria, which she attributes to her medication.  She consumes a lot of sweet tea, which she acknowledges is not beneficial for her blood sugar levels. Her A1c has increased from 7.8 to 8.1, which she attributes to holiday eating habits. She recognizes the need to manage her diet better to control her blood sugar levels.  She cares for her two and a half year old grandchild, lifting her into a high chair, which she speculates might contribute to her side pain.  Review of Systems  Constitutional: Negative.   HENT: Negative.    Eyes: Negative.   Respiratory:  Negative for cough, chest tightness and shortness of breath.   Cardiovascular:  Negative for chest pain, palpitations and leg swelling.  Gastrointestinal:  Negative for abdominal distention, abdominal pain, constipation, diarrhea, nausea and vomiting.  Genitourinary:  Positive for dysuria,  flank pain and hematuria.  Skin: Negative.   Neurological: Negative.   Psychiatric/Behavioral: Negative.      Objective:  Physical Exam Constitutional:      Appearance: She is well-developed.  HENT:     Head: Normocephalic and atraumatic.  Cardiovascular:     Rate and Rhythm: Normal rate and regular rhythm.  Pulmonary:     Effort: Pulmonary effort is normal. No respiratory distress.     Breath sounds: Normal breath sounds. No wheezing or rales.  Abdominal:     General: Bowel sounds are normal. There is no distension.     Palpations: Abdomen is soft.     Tenderness: There is no abdominal tenderness.  Musculoskeletal:     Cervical back: Normal range of motion.  Skin:    General: Skin is warm and dry.  Neurological:     Mental Status: She is alert and oriented to person, place, and time.     Coordination: Coordination normal.     Vitals:   01/30/24 0839  BP: 130/80  Pulse: 79  Temp: 98.1 F (36.7 C)  TempSrc: Oral  SpO2: 98%  Weight: 135 lb 9.6 oz (61.5 kg)  Height: 5' 2 (1.575 m)   Assessment and Plan Assessment & Plan Acute cystitis   Symptoms suggest an early urinary tract infection. Urinalysis is negative for bacteria or blood, indicating an early infection or possible nephrolithiasis. Prescribed an antibiotic for bladder infection macrobid  5 day course and diflucan  to cover for yeast infection. She should monitor symptoms and contact if no improvement by late Thursday or Friday  for a potential CT scan to rule out nephrolithiasis  Nephrolithiasis   She has a history of large kidney stones. Current symptoms may relate to nephrolithiasis or non-stone causes. A CT scan will be considered if symptoms do not improve with antibiotic treatment in 1-2 days.  Type 2 diabetes mellitus   A1c increased from 7.8 to 8.1, likely due to dietary changes. Farxiga  may contribute to urinary tract infections. Continue Farxiga  10 mg. Advised on dietary modifications to manage blood  sugar levels. Checking UACR today.   "

## 2024-02-01 ENCOUNTER — Ambulatory Visit: Payer: Self-pay | Admitting: Internal Medicine

## 2024-02-01 ENCOUNTER — Telehealth: Payer: Self-pay

## 2024-02-01 DIAGNOSIS — R1031 Right lower quadrant pain: Secondary | ICD-10-CM

## 2024-02-01 NOTE — Telephone Encounter (Signed)
 Copied from CRM [id number]. Topic: Clinical - Request for Lab/Test Order >> Feb 01, 2024  1:51 PM Suzen RAMAN wrote: Reason for CRM: Patient called per providers instructions to inform provider that she is still spotting and would like to have order placed for CT scan to rule out nephrolithiasis since symptoms are not improving. Please contact patient to advise once orders have been placed.    CB#[phone]

## 2024-02-05 MED ORDER — FLUCONAZOLE 150 MG PO TABS: 150.0000 mg | ORAL_TABLET | Freq: Once | ORAL | 0 refills | Status: AC

## 2024-02-05 NOTE — Addendum Note (Signed)
 Addended by: ROLLENE NORRIS A on: 02/05/2024 02:50 PM   Modules accepted: Orders

## 2024-02-05 NOTE — Telephone Encounter (Signed)
 Ordered CT for her and sent in additional diflucan 

## 2024-02-06 NOTE — Telephone Encounter (Signed)
 Spoke with pt and stated that she has been contacted about CT scan. Pt has to return call to schedule. Pt also stated that she has gotten notification about rx and will be picking that up. Pt did not have any further questions.

## 2024-02-13 ENCOUNTER — Other Ambulatory Visit

## 2024-02-26 ENCOUNTER — Other Ambulatory Visit

## 2024-04-09 ENCOUNTER — Encounter: Admitting: Internal Medicine

## 2024-04-09 ENCOUNTER — Ambulatory Visit

## 2024-04-15 ENCOUNTER — Ambulatory Visit: Admitting: Internal Medicine
